# Patient Record
Sex: Male | Born: 1945 | Race: Black or African American | Hispanic: No | Smoking: Former smoker
Health system: Southern US, Community
[De-identification: ages and names within clinical notes are randomized; demographics above are authoritative.]

## PROBLEM LIST (undated history)

## (undated) DIAGNOSIS — K922 Gastrointestinal hemorrhage, unspecified: Secondary | ICD-10-CM

## (undated) DIAGNOSIS — E785 Hyperlipidemia, unspecified: Secondary | ICD-10-CM

## (undated) DIAGNOSIS — C163 Malignant neoplasm of pyloric antrum: Secondary | ICD-10-CM

## (undated) DIAGNOSIS — I4819 Other persistent atrial fibrillation: Principal | ICD-10-CM

## (undated) DIAGNOSIS — I10 Essential (primary) hypertension: Secondary | ICD-10-CM

## (undated) DIAGNOSIS — Z9289 Personal history of other medical treatment: Secondary | ICD-10-CM

## (undated) DIAGNOSIS — I639 Cerebral infarction, unspecified: Secondary | ICD-10-CM

## (undated) DIAGNOSIS — D649 Anemia, unspecified: Secondary | ICD-10-CM

## (undated) DIAGNOSIS — E119 Type 2 diabetes mellitus without complications: Secondary | ICD-10-CM

## (undated) HISTORY — DX: Other persistent atrial fibrillation: I48.19

## (undated) HISTORY — PX: INGUINAL HERNIA REPAIR: SUR1180

## (undated) HISTORY — DX: Hyperlipidemia, unspecified: E78.5

## (undated) HISTORY — DX: Essential (primary) hypertension: I10

## (undated) HISTORY — DX: Cerebral infarction, unspecified: I63.9

---

## 2006-12-29 ENCOUNTER — Inpatient Hospital Stay (HOSPITAL_COMMUNITY): Admission: EM | Admit: 2006-12-29 | Discharge: 2007-01-02 | Payer: Self-pay | Admitting: Emergency Medicine

## 2006-12-30 ENCOUNTER — Encounter (INDEPENDENT_AMBULATORY_CARE_PROVIDER_SITE_OTHER): Payer: Self-pay | Admitting: *Deleted

## 2006-12-31 ENCOUNTER — Ambulatory Visit: Payer: Self-pay | Admitting: Physical Medicine & Rehabilitation

## 2007-01-01 ENCOUNTER — Encounter (INDEPENDENT_AMBULATORY_CARE_PROVIDER_SITE_OTHER): Payer: Self-pay | Admitting: Cardiology

## 2007-03-07 DIAGNOSIS — I639 Cerebral infarction, unspecified: Secondary | ICD-10-CM

## 2007-03-07 HISTORY — DX: Cerebral infarction, unspecified: I63.9

## 2007-04-15 ENCOUNTER — Ambulatory Visit: Payer: Self-pay | Admitting: *Deleted

## 2007-05-28 ENCOUNTER — Encounter (INDEPENDENT_AMBULATORY_CARE_PROVIDER_SITE_OTHER): Payer: Self-pay | Admitting: Nurse Practitioner

## 2007-05-28 ENCOUNTER — Ambulatory Visit: Payer: Self-pay | Admitting: Internal Medicine

## 2007-05-28 LAB — CONVERTED CEMR LAB
AST: 21 units/L (ref 0–37)
BUN: 12 mg/dL (ref 6–23)
CO2: 24 meq/L (ref 19–32)
Creatinine, Ser: 0.99 mg/dL (ref 0.40–1.50)
Eosinophils Absolute: 0.1 10*3/uL (ref 0.0–0.7)
Glucose, Bld: 63 mg/dL — ABNORMAL LOW (ref 70–99)
Hemoglobin: 11.7 g/dL — ABNORMAL LOW (ref 13.0–17.0)
Lymphocytes Relative: 35 % (ref 12–46)
MCHC: 32.1 g/dL (ref 30.0–36.0)
Monocytes Absolute: 0.4 10*3/uL (ref 0.1–1.0)
Neutrophils Relative %: 58 % (ref 43–77)
Potassium: 4.3 meq/L (ref 3.5–5.3)
RBC: 4.42 M/uL (ref 4.22–5.81)
RDW: 14.8 % (ref 11.5–15.5)
TSH: 2.172 microintl units/mL (ref 0.350–5.50)
Total Bilirubin: 0.4 mg/dL (ref 0.3–1.2)
Total Protein: 7.2 g/dL (ref 6.0–8.3)
WBC: 6.1 10*3/uL (ref 4.0–10.5)

## 2007-06-26 ENCOUNTER — Encounter (INDEPENDENT_AMBULATORY_CARE_PROVIDER_SITE_OTHER): Payer: Self-pay | Admitting: Nurse Practitioner

## 2007-06-26 ENCOUNTER — Ambulatory Visit: Payer: Self-pay | Admitting: Family Medicine

## 2007-06-26 LAB — CONVERTED CEMR LAB
LDL Cholesterol: 67 mg/dL (ref 0–99)
Total CHOL/HDL Ratio: 3.2
Triglycerides: 74 mg/dL (ref <150)

## 2007-11-20 ENCOUNTER — Ambulatory Visit: Payer: Self-pay | Admitting: Internal Medicine

## 2007-11-20 LAB — CONVERTED CEMR LAB
ALT: 24 units/L (ref 0–53)
AST: 17 units/L (ref 0–37)
Albumin: 4.3 g/dL (ref 3.5–5.2)
Calcium: 9 mg/dL (ref 8.4–10.5)
Sodium: 144 meq/L (ref 135–145)
Total Bilirubin: 0.5 mg/dL (ref 0.3–1.2)

## 2007-12-02 ENCOUNTER — Ambulatory Visit: Payer: Self-pay | Admitting: Internal Medicine

## 2008-02-03 ENCOUNTER — Ambulatory Visit (HOSPITAL_COMMUNITY): Admission: RE | Admit: 2008-02-03 | Discharge: 2008-02-03 | Payer: Self-pay | Admitting: Internal Medicine

## 2008-02-03 ENCOUNTER — Ambulatory Visit: Payer: Self-pay | Admitting: Internal Medicine

## 2008-02-26 ENCOUNTER — Ambulatory Visit: Payer: Self-pay | Admitting: Internal Medicine

## 2008-05-03 ENCOUNTER — Emergency Department (HOSPITAL_COMMUNITY): Admission: EM | Admit: 2008-05-03 | Discharge: 2008-05-03 | Payer: Self-pay | Admitting: Emergency Medicine

## 2008-05-20 ENCOUNTER — Ambulatory Visit: Payer: Self-pay | Admitting: Internal Medicine

## 2008-05-22 ENCOUNTER — Ambulatory Visit: Payer: Self-pay | Admitting: Internal Medicine

## 2008-06-04 ENCOUNTER — Ambulatory Visit: Payer: Self-pay | Admitting: Internal Medicine

## 2008-06-23 ENCOUNTER — Encounter: Admission: RE | Admit: 2008-06-23 | Discharge: 2008-08-06 | Payer: Self-pay | Admitting: Internal Medicine

## 2008-08-19 ENCOUNTER — Ambulatory Visit: Payer: Self-pay | Admitting: Internal Medicine

## 2008-08-21 ENCOUNTER — Ambulatory Visit: Payer: Self-pay | Admitting: Family Medicine

## 2008-09-04 ENCOUNTER — Ambulatory Visit: Payer: Self-pay | Admitting: Family Medicine

## 2008-09-11 ENCOUNTER — Ambulatory Visit: Payer: Self-pay | Admitting: Internal Medicine

## 2008-10-06 ENCOUNTER — Ambulatory Visit: Payer: Self-pay | Admitting: Internal Medicine

## 2008-11-14 ENCOUNTER — Inpatient Hospital Stay (HOSPITAL_COMMUNITY): Admission: EM | Admit: 2008-11-14 | Discharge: 2008-11-16 | Payer: Self-pay | Admitting: Emergency Medicine

## 2008-11-16 ENCOUNTER — Encounter (INDEPENDENT_AMBULATORY_CARE_PROVIDER_SITE_OTHER): Payer: Self-pay | Admitting: Internal Medicine

## 2008-11-16 ENCOUNTER — Ambulatory Visit: Payer: Self-pay | Admitting: Vascular Surgery

## 2008-12-04 ENCOUNTER — Ambulatory Visit: Payer: Self-pay | Admitting: Internal Medicine

## 2008-12-04 ENCOUNTER — Encounter (INDEPENDENT_AMBULATORY_CARE_PROVIDER_SITE_OTHER): Payer: Self-pay | Admitting: Internal Medicine

## 2008-12-04 LAB — CONVERTED CEMR LAB
Basophils Absolute: 0 10*3/uL (ref 0.0–0.1)
Basophils Relative: 1 % (ref 0–1)
Eosinophils Absolute: 0 10*3/uL (ref 0.0–0.7)
Eosinophils Relative: 1 % (ref 0–5)
HCT: 35.9 % — ABNORMAL LOW (ref 39.0–52.0)
Lymphocytes Relative: 37 % (ref 12–46)
Lymphs Abs: 1.8 10*3/uL (ref 0.7–4.0)
MCV: 83.1 fL (ref 78.0–100.0)
Neutrophils Relative %: 54 % (ref 43–77)
PSA: 1 ng/mL (ref 0.10–4.00)
RBC: 4.32 M/uL (ref 4.22–5.81)
RDW: 14.6 % (ref 11.5–15.5)

## 2008-12-10 ENCOUNTER — Ambulatory Visit: Payer: Self-pay | Admitting: Internal Medicine

## 2008-12-29 ENCOUNTER — Ambulatory Visit: Payer: Self-pay | Admitting: Internal Medicine

## 2009-01-13 ENCOUNTER — Encounter: Payer: Self-pay | Admitting: Cardiology

## 2009-01-13 ENCOUNTER — Other Ambulatory Visit: Payer: Self-pay | Admitting: General Surgery

## 2009-01-14 ENCOUNTER — Encounter (INDEPENDENT_AMBULATORY_CARE_PROVIDER_SITE_OTHER): Payer: Self-pay | Admitting: Cardiology

## 2009-01-14 ENCOUNTER — Inpatient Hospital Stay (HOSPITAL_COMMUNITY): Admission: EM | Admit: 2009-01-14 | Discharge: 2009-01-22 | Payer: Self-pay | Admitting: Cardiology

## 2009-01-14 DIAGNOSIS — I4819 Other persistent atrial fibrillation: Secondary | ICD-10-CM

## 2009-01-14 HISTORY — DX: Other persistent atrial fibrillation: I48.19

## 2009-01-15 HISTORY — PX: CARDIAC CATHETERIZATION: SHX172

## 2009-02-10 ENCOUNTER — Ambulatory Visit: Payer: Self-pay | Admitting: Internal Medicine

## 2009-02-10 ENCOUNTER — Encounter (INDEPENDENT_AMBULATORY_CARE_PROVIDER_SITE_OTHER): Payer: Self-pay | Admitting: Internal Medicine

## 2009-02-10 LAB — CONVERTED CEMR LAB
Eosinophils Relative: 2 % (ref 0–5)
Microalb, Ur: 6.23 mg/dL — ABNORMAL HIGH (ref 0.00–1.89)
Monocytes Absolute: 0.4 10*3/uL (ref 0.1–1.0)
Platelets: 283 10*3/uL (ref 150–400)
Vitamin B-12: 1206 pg/mL — ABNORMAL HIGH (ref 211–911)
WBC: 4.8 10*3/uL (ref 4.0–10.5)

## 2009-03-02 ENCOUNTER — Ambulatory Visit: Payer: Self-pay | Admitting: Internal Medicine

## 2009-03-02 LAB — CONVERTED CEMR LAB
Hemoglobin: 10.6 g/dL — ABNORMAL LOW (ref 13.0–17.0)
Lymphocytes Relative: 38 % (ref 12–46)
Lymphs Abs: 1.5 10*3/uL (ref 0.7–4.0)
MCHC: 32.7 g/dL (ref 30.0–36.0)
MCV: 81 fL (ref 78.0–100.0)
Neutro Abs: 2 10*3/uL (ref 1.7–7.7)
Neutrophils Relative %: 52 % (ref 43–77)
Platelets: 237 10*3/uL (ref 150–400)
RBC: 4 M/uL — ABNORMAL LOW (ref 4.22–5.81)
RDW: 14.3 % (ref 11.5–15.5)
Vitamin B-12: 363 pg/mL (ref 211–911)
WBC: 3.9 10*3/uL — ABNORMAL LOW (ref 4.0–10.5)

## 2009-04-29 ENCOUNTER — Ambulatory Visit: Payer: Self-pay | Admitting: Internal Medicine

## 2009-05-06 ENCOUNTER — Ambulatory Visit (HOSPITAL_COMMUNITY): Admission: RE | Admit: 2009-05-06 | Discharge: 2009-05-06 | Payer: Self-pay | Admitting: General Surgery

## 2009-05-13 ENCOUNTER — Ambulatory Visit: Payer: Self-pay | Admitting: Internal Medicine

## 2009-05-13 LAB — CONVERTED CEMR LAB
Basophils Absolute: 0.1 10*3/uL (ref 0.0–0.1)
Basophils Relative: 1 % (ref 0–1)
HCT: 35.9 % — ABNORMAL LOW (ref 39.0–52.0)
Hemoglobin: 11 g/dL — ABNORMAL LOW (ref 13.0–17.0)
Iron: 47 ug/dL (ref 42–165)
Lymphocytes Relative: 33 % (ref 12–46)
MCHC: 30.6 g/dL (ref 30.0–36.0)
Microalb, Ur: 3.3 mg/dL — ABNORMAL HIGH (ref 0.00–1.89)
Monocytes Absolute: 0.4 10*3/uL (ref 0.1–1.0)
Neutrophils Relative %: 56 % (ref 43–77)
Platelets: 355 10*3/uL (ref 150–400)
RBC: 4.34 M/uL (ref 4.22–5.81)
RDW: 15.5 % (ref 11.5–15.5)
Saturation Ratios: 19 % — ABNORMAL LOW (ref 20–55)
TIBC: 254 ug/dL (ref 215–435)
UIBC: 207 ug/dL
WBC: 5.3 10*3/uL (ref 4.0–10.5)

## 2009-07-09 ENCOUNTER — Ambulatory Visit: Payer: Self-pay | Admitting: Internal Medicine

## 2009-08-31 ENCOUNTER — Ambulatory Visit: Payer: Self-pay | Admitting: Internal Medicine

## 2009-08-31 LAB — CONVERTED CEMR LAB
CO2: 21 meq/L (ref 19–32)
Calcium: 9.1 mg/dL (ref 8.4–10.5)
Glucose, Bld: 61 mg/dL — ABNORMAL LOW (ref 70–99)
HDL: 41 mg/dL (ref >39)
LDL Cholesterol: 70 mg/dL (ref 0–99)
Total CHOL/HDL Ratio: 3.1

## 2009-09-30 ENCOUNTER — Ambulatory Visit: Payer: Self-pay | Admitting: Internal Medicine

## 2009-10-27 ENCOUNTER — Ambulatory Visit: Payer: Self-pay | Admitting: Internal Medicine

## 2009-12-30 ENCOUNTER — Ambulatory Visit: Payer: Self-pay | Admitting: Internal Medicine

## 2009-12-30 ENCOUNTER — Encounter (INDEPENDENT_AMBULATORY_CARE_PROVIDER_SITE_OTHER): Payer: Self-pay | Admitting: *Deleted

## 2009-12-30 LAB — CONVERTED CEMR LAB
ALT: <8 units/L (ref 0–53)
Albumin: 4.2 g/dL (ref 3.5–5.2)
Alkaline Phosphatase: 60 units/L (ref 39–117)
CO2: 26 meq/L (ref 19–32)
Chloride: 106 meq/L (ref 96–112)
HDL: 39 mg/dL — ABNORMAL LOW (ref >39)
LDL Cholesterol: 86 mg/dL (ref 0–99)
Total Bilirubin: 0.3 mg/dL (ref 0.3–1.2)
Total CHOL/HDL Ratio: 3.5
Total Protein: 6.7 g/dL (ref 6.0–8.3)

## 2010-05-29 LAB — BASIC METABOLIC PANEL
BUN: 11 mg/dL (ref 6–23)
CO2: 28 mEq/L (ref 19–32)
Creatinine, Ser: 1.22 mg/dL (ref 0.4–1.5)
GFR calc Af Amer: >60 mL/min (ref >60)
GFR calc non Af Amer: 60 mL/min — ABNORMAL LOW (ref >60)

## 2010-05-29 LAB — CBC
HCT: 35.3 % — ABNORMAL LOW (ref 39.0–52.0)
Platelets: 214 10*3/uL (ref 150–400)
RDW: 15.5 % (ref 11.5–15.5)

## 2010-05-29 LAB — GLUCOSE, CAPILLARY
Glucose-Capillary: 102 mg/dL — ABNORMAL HIGH (ref 70–99)
Glucose-Capillary: 112 mg/dL — ABNORMAL HIGH (ref 70–99)

## 2010-06-08 LAB — LIPID PANEL
HDL: 33 mg/dL — ABNORMAL LOW (ref >39)
LDL Cholesterol: 57 mg/dL (ref 0–99)
Triglycerides: 70 mg/dL (ref <150)
VLDL: 14 mg/dL (ref 0–40)

## 2010-06-08 LAB — GLUCOSE, CAPILLARY
Glucose-Capillary: 113 mg/dL — ABNORMAL HIGH (ref 70–99)
Glucose-Capillary: 128 mg/dL — ABNORMAL HIGH (ref 70–99)
Glucose-Capillary: 83 mg/dL (ref 70–99)
Glucose-Capillary: 85 mg/dL (ref 70–99)
Glucose-Capillary: 96 mg/dL (ref 70–99)
Glucose-Capillary: 96 mg/dL (ref 70–99)
Glucose-Capillary: 102 mg/dL — ABNORMAL HIGH (ref 70–99)
Glucose-Capillary: 104 mg/dL — ABNORMAL HIGH (ref 70–99)
Glucose-Capillary: 116 mg/dL — ABNORMAL HIGH (ref 70–99)
Glucose-Capillary: 118 mg/dL — ABNORMAL HIGH (ref 70–99)
Glucose-Capillary: 119 mg/dL — ABNORMAL HIGH (ref 70–99)
Glucose-Capillary: 128 mg/dL — ABNORMAL HIGH (ref 70–99)
Glucose-Capillary: 142 mg/dL — ABNORMAL HIGH (ref 70–99)
Glucose-Capillary: 162 mg/dL — ABNORMAL HIGH (ref 70–99)
Glucose-Capillary: 163 mg/dL — ABNORMAL HIGH (ref 70–99)
Glucose-Capillary: 188 mg/dL — ABNORMAL HIGH (ref 70–99)
Glucose-Capillary: 76 mg/dL (ref 70–99)
Glucose-Capillary: 81 mg/dL (ref 70–99)
Glucose-Capillary: 91 mg/dL (ref 70–99)
Glucose-Capillary: 99 mg/dL (ref 70–99)

## 2010-06-08 LAB — BASIC METABOLIC PANEL
BUN: 11 mg/dL (ref 6–23)
BUN: 11 mg/dL (ref 6–23)
BUN: 11 mg/dL (ref 6–23)
BUN: 9 mg/dL (ref 6–23)
CO2: 25 mEq/L (ref 19–32)
CO2: 27 mEq/L (ref 19–32)
CO2: 27 mEq/L (ref 19–32)
Calcium: 8.8 mg/dL (ref 8.4–10.5)
Calcium: 8.3 mg/dL — ABNORMAL LOW (ref 8.4–10.5)
Calcium: 8.5 mg/dL (ref 8.4–10.5)
Calcium: 8.6 mg/dL (ref 8.4–10.5)
Creatinine, Ser: 1.04 mg/dL (ref 0.4–1.5)
GFR calc Af Amer: >60 mL/min (ref >60)
GFR calc non Af Amer: >60 mL/min (ref >60)
GFR calc non Af Amer: >60 mL/min (ref >60)
Glucose, Bld: 72 mg/dL (ref 70–99)
Glucose, Bld: 93 mg/dL (ref 70–99)
Glucose, Bld: 155 mg/dL — ABNORMAL HIGH (ref 70–99)
Glucose, Bld: 164 mg/dL — ABNORMAL HIGH (ref 70–99)
Glucose, Bld: 88 mg/dL (ref 70–99)
Potassium: 3.5 mEq/L (ref 3.5–5.1)
Potassium: 3.8 mEq/L (ref 3.5–5.1)
Sodium: 135 mEq/L (ref 135–145)
Sodium: 135 mEq/L (ref 135–145)
Sodium: 138 mEq/L (ref 135–145)

## 2010-06-08 LAB — HEPARIN LEVEL (UNFRACTIONATED)
Heparin Unfractionated: 0.33 IU/mL (ref 0.30–0.70)
Heparin Unfractionated: 0.37 IU/mL (ref 0.30–0.70)
Heparin Unfractionated: 0.2 IU/mL — ABNORMAL LOW (ref 0.30–0.70)
Heparin Unfractionated: 0.37 IU/mL (ref 0.30–0.70)
Heparin Unfractionated: 0.41 IU/mL (ref 0.30–0.70)
Heparin Unfractionated: 0.45 IU/mL (ref 0.30–0.70)

## 2010-06-08 LAB — CBC
HCT: 27.1 % — ABNORMAL LOW (ref 39.0–52.0)
HCT: 28 % — ABNORMAL LOW (ref 39.0–52.0)
HCT: 29 % — ABNORMAL LOW (ref 39.0–52.0)
HCT: 32.3 % — ABNORMAL LOW (ref 39.0–52.0)
HCT: 35.5 % — ABNORMAL LOW (ref 39.0–52.0)
Hemoglobin: 9.1 g/dL — ABNORMAL LOW (ref 13.0–17.0)
Hemoglobin: 9.4 g/dL — ABNORMAL LOW (ref 13.0–17.0)
Hemoglobin: 9.8 g/dL — ABNORMAL LOW (ref 13.0–17.0)
Hemoglobin: 11.6 g/dL — ABNORMAL LOW (ref 13.0–17.0)
MCHC: 33.6 g/dL (ref 30.0–36.0)
MCHC: 33.7 g/dL (ref 30.0–36.0)
MCHC: 33.7 g/dL (ref 30.0–36.0)
MCHC: 33.9 g/dL (ref 30.0–36.0)
MCHC: 32.7 g/dL (ref 30.0–36.0)
MCV: 83.9 fL (ref 78.0–100.0)
MCV: 83.9 fL (ref 78.0–100.0)
MCV: 84.4 fL (ref 78.0–100.0)
MCV: 84.2 fL (ref 78.0–100.0)
Platelets: 200 10*3/uL (ref 150–400)
Platelets: 158 10*3/uL (ref 150–400)
Platelets: 158 10*3/uL (ref 150–400)
Platelets: 158 10*3/uL (ref 150–400)
Platelets: 160 10*3/uL (ref 150–400)
Platelets: 203 10*3/uL (ref 150–400)
RBC: 3.23 MIL/uL — ABNORMAL LOW (ref 4.22–5.81)
RBC: 3.58 MIL/uL — ABNORMAL LOW (ref 4.22–5.81)
RBC: 3.45 MIL/uL — ABNORMAL LOW (ref 4.22–5.81)
RBC: 3.58 MIL/uL — ABNORMAL LOW (ref 4.22–5.81)
RBC: 4.21 MIL/uL — ABNORMAL LOW (ref 4.22–5.81)
RDW: 14.2 % (ref 11.5–15.5)
RDW: 13.8 % (ref 11.5–15.5)
RDW: 13.9 % (ref 11.5–15.5)
RDW: 13.9 % (ref 11.5–15.5)
RDW: 14.1 % (ref 11.5–15.5)
RDW: 14.3 % (ref 11.5–15.5)
RDW: 14.3 % (ref 11.5–15.5)
WBC: 4.7 10*3/uL (ref 4.0–10.5)
WBC: 5.4 10*3/uL (ref 4.0–10.5)
WBC: 5.5 10*3/uL (ref 4.0–10.5)
WBC: 5 10*3/uL (ref 4.0–10.5)

## 2010-06-08 LAB — COMPREHENSIVE METABOLIC PANEL
ALT: 14 U/L (ref 0–53)
ALT: 13 U/L (ref 0–53)
AST: 13 U/L (ref 0–37)
AST: 17 U/L (ref 0–37)
Albumin: 3.1 g/dL — ABNORMAL LOW (ref 3.5–5.2)
Albumin: 3.6 g/dL (ref 3.5–5.2)
Alkaline Phosphatase: 59 U/L (ref 39–117)
Alkaline Phosphatase: 68 U/L (ref 39–117)
BUN: 14 mg/dL (ref 6–23)
CO2: 30 mEq/L (ref 19–32)
Calcium: 9.4 mg/dL (ref 8.4–10.5)
Chloride: 104 mEq/L (ref 96–112)
Chloride: 103 mEq/L (ref 96–112)
Creatinine, Ser: 1.03 mg/dL (ref 0.4–1.5)
GFR calc Af Amer: >60 mL/min (ref >60)
GFR calc Af Amer: >60 mL/min (ref >60)
GFR calc non Af Amer: >60 mL/min (ref >60)
Glucose, Bld: 182 mg/dL — ABNORMAL HIGH (ref 70–99)
Potassium: 3.8 mEq/L (ref 3.5–5.1)
Potassium: 4.3 mEq/L (ref 3.5–5.1)
Sodium: 137 mEq/L (ref 135–145)
Sodium: 141 mEq/L (ref 135–145)
Total Bilirubin: 0.6 mg/dL (ref 0.3–1.2)
Total Bilirubin: 0.4 mg/dL (ref 0.3–1.2)
Total Protein: 5.8 g/dL — ABNORMAL LOW (ref 6.0–8.3)
Total Protein: 6.6 g/dL (ref 6.0–8.3)

## 2010-06-08 LAB — RAPID URINE DRUG SCREEN, HOSP PERFORMED
Amphetamines: NOT DETECTED
Benzodiazepines: NOT DETECTED
Cocaine: NOT DETECTED
Opiates: NOT DETECTED
Tetrahydrocannabinol: NOT DETECTED

## 2010-06-08 LAB — URINALYSIS, ROUTINE W REFLEX MICROSCOPIC
Bilirubin Urine: NEGATIVE
Glucose, UA: 250 mg/dL — AB
Hgb urine dipstick: NEGATIVE
Ketones, ur: NEGATIVE mg/dL
Nitrite: NEGATIVE
Protein, ur: NEGATIVE mg/dL
Specific Gravity, Urine: 1.02 (ref 1.005–1.030)
Urobilinogen, UA: 1 mg/dL (ref 0.0–1.0)
pH: 5.5 (ref 5.0–8.0)

## 2010-06-08 LAB — PROTIME-INR
INR: 1.83 — ABNORMAL HIGH (ref 0.00–1.49)
INR: 1.93 — ABNORMAL HIGH (ref 0.00–1.49)
INR: 1.21 (ref 0.00–1.49)
INR: 0.97 (ref 0.00–1.49)
Prothrombin Time: 21 seconds — ABNORMAL HIGH (ref 11.6–15.2)
Prothrombin Time: 30.8 seconds — ABNORMAL HIGH (ref 11.6–15.2)
Prothrombin Time: 15.2 seconds (ref 11.6–15.2)
Prothrombin Time: 12.8 seconds (ref 11.6–15.2)

## 2010-06-08 LAB — HEMOGLOBIN A1C
Hgb A1c MFr Bld: 6.2 % — ABNORMAL HIGH (ref 4.6–6.1)
Mean Plasma Glucose: 131 mg/dL

## 2010-06-08 LAB — MAGNESIUM: Magnesium: 1.5 mg/dL (ref 1.5–2.5)

## 2010-06-08 LAB — CK TOTAL AND CKMB (NOT AT ARMC)
CK, MB: 2.9 ng/mL (ref 0.3–4.0)
CK, MB: 2.7 ng/mL (ref 0.3–4.0)
Relative Index: 2.2 (ref 0.0–2.5)
Relative Index: 2.3 (ref 0.0–2.5)
Total CK: 118 U/L (ref 7–232)

## 2010-06-08 LAB — APTT: aPTT: 29 seconds (ref 24–37)

## 2010-06-08 LAB — BRAIN NATRIURETIC PEPTIDE
Pro B Natriuretic peptide (BNP): 206 pg/mL — ABNORMAL HIGH (ref 0.0–100.0)
Pro B Natriuretic peptide (BNP): 500 pg/mL — ABNORMAL HIGH (ref 0.0–100.0)

## 2010-06-08 LAB — TROPONIN I: Troponin I: 0.03 ng/mL (ref 0.00–0.06)

## 2010-06-08 LAB — TSH: TSH: 1.599 u[IU]/mL (ref 0.350–4.500)

## 2010-06-10 LAB — CBC
MCHC: 33.1 g/dL (ref 30.0–36.0)
MCHC: 33.4 g/dL (ref 30.0–36.0)
MCHC: 33.5 g/dL (ref 30.0–36.0)
MCV: 83.8 fL (ref 78.0–100.0)
MCV: 84.3 fL (ref 78.0–100.0)
Platelets: 188 10*3/uL (ref 150–400)
Platelets: 189 10*3/uL (ref 150–400)
RBC: 3.77 MIL/uL — ABNORMAL LOW (ref 4.22–5.81)
RBC: 3.83 MIL/uL — ABNORMAL LOW (ref 4.22–5.81)
RDW: 14.7 % (ref 11.5–15.5)
RDW: 14.9 % (ref 11.5–15.5)
RDW: 15 % (ref 11.5–15.5)

## 2010-06-10 LAB — DIFFERENTIAL
Basophils Absolute: 0 10*3/uL (ref 0.0–0.1)
Basophils Relative: 0 % (ref 0–1)
Lymphocytes Relative: 41 % (ref 12–46)
Neutro Abs: 2.1 10*3/uL (ref 1.7–7.7)
Neutrophils Relative %: 51 % (ref 43–77)

## 2010-06-10 LAB — COMPREHENSIVE METABOLIC PANEL
ALT: 11 U/L (ref 0–53)
AST: 20 U/L (ref 0–37)
Calcium: 8.9 mg/dL (ref 8.4–10.5)
GFR calc Af Amer: >60 mL/min (ref >60)
Sodium: 138 mEq/L (ref 135–145)
Total Protein: 5.9 g/dL — ABNORMAL LOW (ref 6.0–8.3)

## 2010-06-10 LAB — GLUCOSE, CAPILLARY
Glucose-Capillary: 142 mg/dL — ABNORMAL HIGH (ref 70–99)
Glucose-Capillary: 146 mg/dL — ABNORMAL HIGH (ref 70–99)
Glucose-Capillary: 183 mg/dL — ABNORMAL HIGH (ref 70–99)
Glucose-Capillary: 220 mg/dL — ABNORMAL HIGH (ref 70–99)
Glucose-Capillary: 85 mg/dL (ref 70–99)

## 2010-06-10 LAB — POCT I-STAT, CHEM 8
Calcium, Ion: 1.15 mmol/L (ref 1.12–1.32)
Creatinine, Ser: 1 mg/dL (ref 0.4–1.5)
Hemoglobin: 11.6 g/dL — ABNORMAL LOW (ref 13.0–17.0)
Sodium: 139 mEq/L (ref 135–145)
TCO2: 21 mmol/L (ref 0–100)

## 2010-06-10 LAB — BASIC METABOLIC PANEL
BUN: 7 mg/dL (ref 6–23)
CO2: 28 mEq/L (ref 19–32)
CO2: 29 mEq/L (ref 19–32)
Chloride: 102 mEq/L (ref 96–112)
Chloride: 105 mEq/L (ref 96–112)
Creatinine, Ser: 0.85 mg/dL (ref 0.4–1.5)
Creatinine, Ser: 0.89 mg/dL (ref 0.4–1.5)
GFR calc Af Amer: >60 mL/min (ref >60)

## 2010-06-10 LAB — CK TOTAL AND CKMB (NOT AT ARMC)
CK, MB: 3.3 ng/mL (ref 0.3–4.0)
Relative Index: 2.2 (ref 0.0–2.5)

## 2010-06-10 LAB — URINALYSIS, ROUTINE W REFLEX MICROSCOPIC
Bilirubin Urine: NEGATIVE
Hgb urine dipstick: NEGATIVE
Nitrite: NEGATIVE

## 2010-06-10 LAB — MAGNESIUM
Magnesium: 2 mg/dL (ref 1.5–2.5)
Magnesium: 2.2 mg/dL (ref 1.5–2.5)

## 2010-06-10 LAB — LIPID PANEL
Cholesterol: 117 mg/dL (ref 0–200)
Total CHOL/HDL Ratio: 3 RATIO

## 2010-06-10 LAB — IRON AND TIBC: UIBC: 170 ug/dL

## 2010-06-10 LAB — HEMOGLOBIN A1C
Hgb A1c MFr Bld: 6.4 % — ABNORMAL HIGH (ref 4.6–6.1)
Mean Plasma Glucose: 137 mg/dL

## 2010-06-10 LAB — FERRITIN: Ferritin: 44 ng/mL (ref 22–322)

## 2010-06-21 LAB — DIFFERENTIAL
Basophils Absolute: 0 10*3/uL (ref 0.0–0.1)
Basophils Relative: 0 % (ref 0–1)
Eosinophils Absolute: 0 10*3/uL (ref 0.0–0.7)
Monocytes Absolute: 0.3 10*3/uL (ref 0.1–1.0)
Neutro Abs: 3.3 10*3/uL (ref 1.7–7.7)

## 2010-06-21 LAB — COMPREHENSIVE METABOLIC PANEL
ALT: 28 U/L (ref 0–53)
Albumin: 3.8 g/dL (ref 3.5–5.2)
Alkaline Phosphatase: 68 U/L (ref 39–117)
BUN: 21 mg/dL (ref 6–23)
Chloride: 105 mEq/L (ref 96–112)
Glucose, Bld: 139 mg/dL — ABNORMAL HIGH (ref 70–99)
Potassium: 4.9 mEq/L (ref 3.5–5.1)
Total Bilirubin: 0.6 mg/dL (ref 0.3–1.2)

## 2010-06-21 LAB — POCT CARDIAC MARKERS
CKMB, poc: 1.2 ng/mL (ref 1.0–8.0)
CKMB, poc: 2.3 ng/mL (ref 1.0–8.0)
Troponin i, poc: <0.05 ng/mL (ref 0.00–0.09)

## 2010-06-21 LAB — CBC
HCT: 38 % — ABNORMAL LOW (ref 39.0–52.0)
Hemoglobin: 12.9 g/dL — ABNORMAL LOW (ref 13.0–17.0)
Platelets: 238 10*3/uL (ref 150–400)
WBC: 4.8 10*3/uL (ref 4.0–10.5)

## 2010-07-19 NOTE — Discharge Summary (Signed)
Patrick [name], Patrick [name]             ACCOUNT NO.:  [id number]   MEDICAL RECORD NO.:  [id number]          PATIENT TYPE:  INP   LOCATION:  3021                         FACILITY:  [facility]   PHYSICIAN:  Pramod P. Pearlean Brownie, MD    DATE OF BIRTH:  [birth year]   DATE OF ADMISSION:  12/29/2006  DATE OF DISCHARGE:  01/02/2007                               DISCHARGE SUMMARY   DISCHARGE DIAGNOSES:  1. 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      Infarct thought to be embolic though no source found.  2. Diabetes.  3. Hypertension.  4. Cigarette smoker.   DISCHARGE MEDICATIONS:  1. Lisinopril 10 mg a day.  2. Lipitor 10 mg a day.  3. Metformin 1000 mg b.i.d.  4. Glipizide XL 5 mg twice a day.  5. Aggrenox one p.o. q.h.s. x2 weeks and then increase to b.i.d.  6. Aspirin 81 mg 1 p.o. q.a.m. x2 weeks and then discontinue.  7. Tylenol 650 mg 1 hour before Aggrenox dose x1 week only and then      discontinue.   STUDIES PERFORMED:  1. CT of the brain on admission shows dense right MCA sign compatible      with MCA thrombus, distal.  2. MRI of the brain shows right middle cerebral artery branch vessel      infarct acute. There is involvement in the insulin temporal      parietal region.  3. MRA of the head shows right MCA branch vessel occlusion with      diminished flow seen in the right temporal parietal region compared      to the left.  4. CT of the head 24 hours post t-PA shows acute right MCA infarct      changes.  No hemorrhage.  5. Carotid Doppler shows no ICA stenosis.  6. Transcranial Doppler performed, results are pending.  7. 2-D echocardiogram shows EF of 60%, no source of embolus.  8. Transesophageal echocardiogram performed by Dr. Yates Decamp shows      aortic atherosclerosis.  No PFO or ASD.  9. EKG shows normal sinus rhythm.   LABORATORY STUDIES:  Cholesterol 117, triglycerides 82, HDL 33, LDL 68.  Urinalysis was 0-2 white blood cell, 0-2 red blood cells and 30  protein.  Homocystine 8.9.  Alcohol level less than 5.  Hemoglobin A1c 10.2.  Coags on admission normal. CBC with hemoglobin 12.2, hematocrit 36.7,  otherwise normal. Chemistry with glucose 125 otherwise normal, liver  function tests normal.   HISTORY OF PRESENT ILLNESS:  Patrick [name] is a 65 year old, right-  handed, African-American male with a history of hypertension and  diabetes. He presented to the emergency room after last being normal and  7:15 p.m. the evening of admission. The son noticed he had some left  facial droop, slurred speech and drowsiness.  He also seemed slightly  confused, weak on his left side.  He complained of a slight posterior  headache.  He denied any dizziness or loss of consciousness.  He was  brought to the emergency room where a Code Stroke was called  en route.  CT of the head showed a dense right MCA clot very distal.  He was felt  to be a t-PA candidate and full-dose t-PA was administered.  He was  admitted to the neuro ICU for follow-up.   HOSPITAL COURSE:  MRI was positive for a right MCA infarct.  He received  full-dose t-PA and CT 24 hours following showed no hemorrhage.  He had  some mild left hemiparesis. He was evaluated by PT and OT and felt he  could benefit from home health follow-up. Of note his vascular risk  factors of diabetes is poorly controlled with hemoglobin A1c of 10.1.  Blood pressure is well-controlled at in 120s/60.  The patient states he  lives with his son.  He has been interested in getting disability for  the past year but they have denied him.  He was applying for disability  based on his diabetes and hypertension.  He is now hopeful that his new  stroke will give him future disability. I am uncertain that that is  indeed case as he has been functional.  He has been asked to follow up  with Dr. Pearlean Brownie for a transcranial Doppler and bubble study after  discharge as it is felt his stroke is embolic though no source was   found.   CONDITION ON DISCHARGE:  The patient alert and oriented x3.  No aphasia.  No dysarthria.  His eye movements are full, his face is symmetric.  He  has no drift in his arms or legs.  He has no focal weakness.  His gait  is broad-based. His heart rate is regular.  His breath sounds are clear.   DISCHARGE/PLAN:  1. Discharge home with family.  2. Aggrenox for secondary stroke prevention.  3. Home health physical therapy.  4. Outpatient bubble study and emboli monitoring at Dr. Marlis Edelson office      within the next month.  5. Follow-up with primary care physician within 1 month [facility]      then followup with Dr. Pearlean Brownie in 2 months.      Annie Main, N.P.    ______________________________  Sunny Schlein. Pearlean Brownie, MD    SB/MEDQ  D:  01/02/2007  T:  01/02/2007  Job:  [id number]   cc:   Pramod P. Pearlean Brownie, MD  Dr. Barnetta Chapel

## 2010-07-19 NOTE — H&P (Signed)
Patrick [name], Patrick [name]             ACCOUNT NO.:  [id number]   MEDICAL RECORD NO.:  [id number]          PATIENT TYPE:  INP   LOCATION:  1826                         FACILITY:  [facility]   PHYSICIAN:  Bevelyn Buckles. Champey, M.D.DATE OF BIRTH:  08/28/1945   DATE OF ADMISSION:  12/29/2006  DATE OF DISCHARGE:                              HISTORY & PHYSICAL   REASON FOR ADMISSION:  Code stroke.   HISTORY OF PRESENT ILLNESS:  Patrick [name] is a 65 year old African-  American male with multiple medical problems who presents to the  emergency room after last being normal at 7:15 p.m. this evening.  The  son noticed that he had some left facial droop, slurred speech, and  drowsiness.  He also seemed slightly confused, slightly weak on his left  side.  He also complained of a slight posterior headache.  Denied any  dizziness or loss of consciousness.  The patient was brought to the  emergency room, and a code stroke was called en route by EMS.   PAST MEDICAL HISTORY:  Positive for hypertension and diabetes.   CURRENT MEDICATIONS:  Include lisinopril, Lipitor, metformin, glipizide,  and baby aspirin.   ALLERGIES:  NO KNOWN DRUG ALLERGIES.   FAMILY HISTORY:  Noncontributory.   SOCIAL HISTORY:  The patient lives with his son, smokes 1 pack of  cigarettes per day, denies any drug use, drinks 16 ounces of beer per  day.   REVIEW OF SYSTEMS:  Positive as per HPI.  Review of systems negative as  per HPI in greater than 7 other organ systems.   EXAMINATION:  VITALS:  Temperature is 98.4, blood pressure is 160/83,  pulse is 57, respiration is 18, and O2 saturation is 98%.  HEENT:  Normocephalic, atraumatic.  Extraocular muscles are intact.  Left visual field cut is given.  NECK:  Supple.  No carotid bruits.  HEART:  Regular.  LUNGS:  Clear.  ABDOMEN:  Soft.  EXTREMITIES:  Good pulses.  NEUROLOGICAL EXAMINATION:  The patient is drowsy yet awake, followed  commands appropriately, has dysarthric  speech.  Cranial nerves:  The  patient has a left facial droop.  Extraocular muscles intact.  The  patient has a left field cut present.  Motor examination:  The patient  has very subtle and mild weakness left upper and lower extremity of 4+  with 5/5 strength with a slight drift noted.  Sensory examination:  Patient does have positive extinction in the cleft noted on the left.  Cerebellar function is within normal with finger-to-nose.  Gait was not  assessed secondary to safety.  NIH stroke scale was 7.   LABS:  WBC is 5.3, hemoglobin 12.2, hematocrit is 36.7, platelets 221.  PT is 12.8, INR is 0.9, PTT is 28.  CT of the head showed a positive  hyperintense on the right MCA distal distribution consistent with  thrombus.   IMPRESSION:  This is a 65 year old with acute right middle cerebral  artery infarct.  The patient met criteria for intravenous tissue  plasminogen activator, and I have discussed the risks with the family  and the patient, and we  have agreed to proceed, give the patient  intravenous tissue plasminogen activator which was started at 9:49 p.m.  Will admit to stroke MD service to a complete stroke workup and then  with the intensive care unit for at least 24 hours.  Get a magnetic  resonance imaging/magnetic resonance angiography of the brain, carotid  Dopplers, 2-D echocardiogram, check fasting lipids and homocysteine  level.  Get physical therapy/occupational therapy, speech consults.  Continue the patient's home medications.  Get a computed tomography scan  24 hours post tissue plasminogen activator and start on an antiplatelet  such as Plavix 24 hours after tissue plasminogen activator.  We will  follow the patient while he is in the hospital.      Bevelyn Buckles. Nash Shearer, M.D.  Electronically Signed     DRC/MEDQ  D:  12/29/2006  T:  12/30/2006  Job:  [id number]

## 2010-09-28 ENCOUNTER — Encounter: Payer: Self-pay | Admitting: Podiatry

## 2010-09-28 DIAGNOSIS — I639 Cerebral infarction, unspecified: Secondary | ICD-10-CM

## 2010-09-28 DIAGNOSIS — I10 Essential (primary) hypertension: Secondary | ICD-10-CM | POA: Insufficient documentation

## 2010-09-28 DIAGNOSIS — E1165 Type 2 diabetes mellitus with hyperglycemia: Secondary | ICD-10-CM | POA: Insufficient documentation

## 2010-11-22 IMAGING — CR DG CHEST 2V
2 series · 2 of 2 positions shown · non-contrast
Comparison: None

CLINICAL DATA: Chest pain and nausea

CHEST - 2 VIEW

[w chest pa]
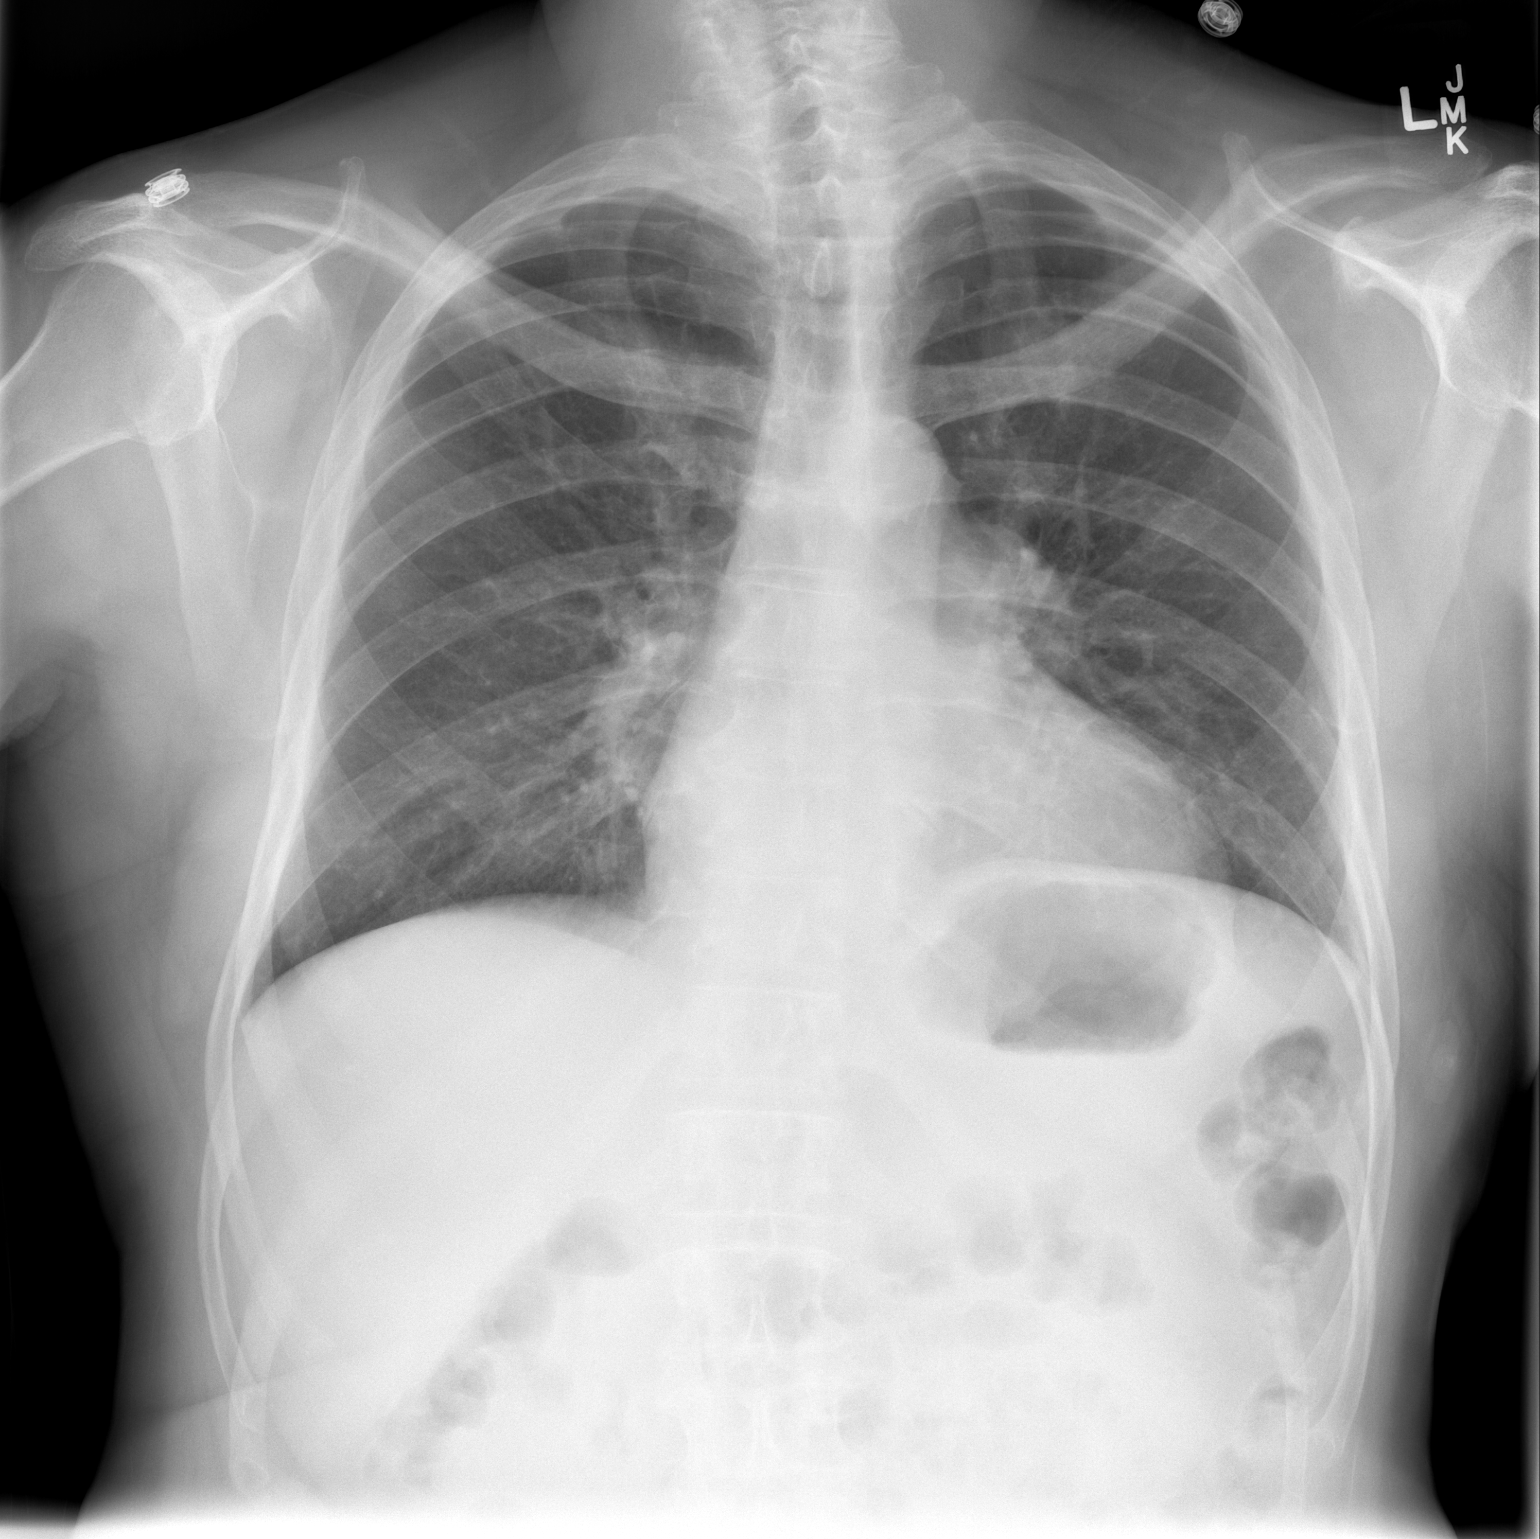

[w chest lat]
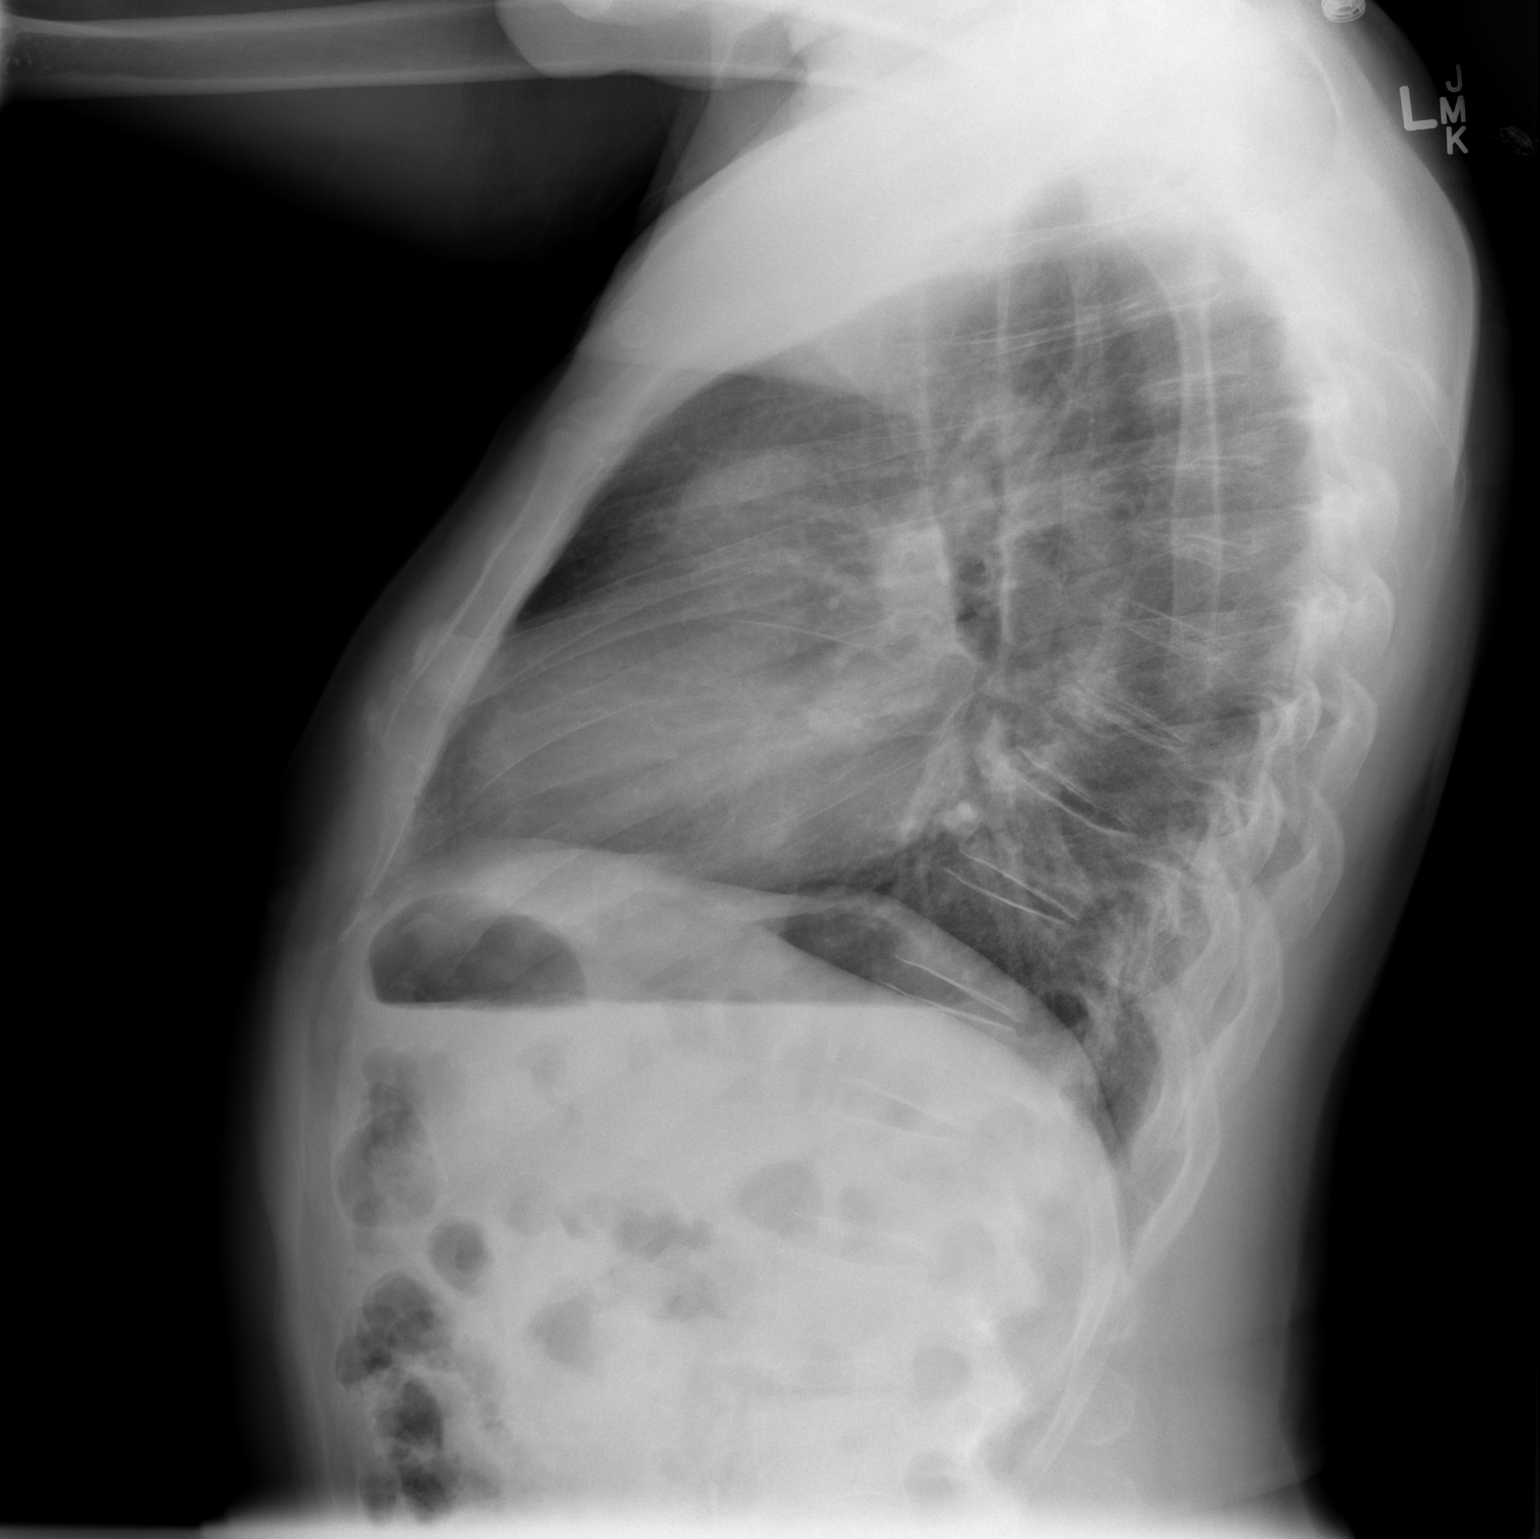

[2 of 2 positions shown; findings below may reference images not displayed]

FINDINGS: Heart size is upper normal.  Mediastinal contours within
normal limits.  Trachea is midline.  Lung volumes are low normal.
No airspace disease, vascular congestion, edema, or pneumothorax is
identified.  Upper abdomen demonstrates a normal bowel gas pattern.
No acute osseous abnormality.
IMPRESSION: No evidence of acute cardiopulmonary disease.

## 2010-12-05 HISTORY — PX: DOPPLER ECHOCARDIOGRAPHY: SHX263

## 2010-12-14 LAB — URINALYSIS, ROUTINE W REFLEX MICROSCOPIC
Hgb urine dipstick: NEGATIVE
Ketones, ur: 15 — AB
Protein, ur: 30 — AB
Urobilinogen, UA: 1

## 2010-12-14 LAB — COMPREHENSIVE METABOLIC PANEL
ALT: 43
CO2: 28
Calcium: 9.6
Chloride: 106
Creatinine, Ser: 0.86
GFR calc non Af Amer: >60
Glucose, Bld: 125 — ABNORMAL HIGH
Sodium: 140
Total Bilirubin: 0.4

## 2010-12-14 LAB — DIFFERENTIAL
Basophils Absolute: 0
Basophils Relative: 1
Eosinophils Absolute: 0
Eosinophils Relative: 1
Lymphs Abs: 2.1
Neutrophils Relative %: 53

## 2010-12-14 LAB — PROTIME-INR
INR: 0.9
Prothrombin Time: 12.8

## 2010-12-14 LAB — LIPID PANEL
Cholesterol: 117
HDL: 33 — ABNORMAL LOW

## 2010-12-14 LAB — CBC
HCT: 36.7 — ABNORMAL LOW
MCHC: 33.2
MCV: 83.1
Platelets: 221

## 2010-12-14 LAB — HOMOCYSTEINE: Homocysteine: 8.9

## 2010-12-14 LAB — URINE MICROSCOPIC-ADD ON

## 2010-12-14 LAB — ETHANOL: Alcohol, Ethyl (B): <5

## 2010-12-14 LAB — HEMOGLOBIN A1C
Hgb A1c MFr Bld: 10.2 — ABNORMAL HIGH
Mean Plasma Glucose: 286

## 2010-12-14 LAB — APTT: aPTT: 28

## 2010-12-14 LAB — CK TOTAL AND CKMB (NOT AT ARMC): Total CK: 130

## 2012-03-06 HISTORY — PX: CATARACT EXTRACTION W/ INTRAOCULAR LENS  IMPLANT, BILATERAL: SHX1307

## 2012-05-23 ENCOUNTER — Ambulatory Visit: Payer: Self-pay | Admitting: Cardiology

## 2012-05-23 DIAGNOSIS — Z7901 Long term (current) use of anticoagulants: Secondary | ICD-10-CM

## 2012-05-23 DIAGNOSIS — I482 Chronic atrial fibrillation, unspecified: Secondary | ICD-10-CM | POA: Insufficient documentation

## 2012-05-23 DIAGNOSIS — I4891 Unspecified atrial fibrillation: Secondary | ICD-10-CM

## 2012-05-23 DIAGNOSIS — I639 Cerebral infarction, unspecified: Secondary | ICD-10-CM

## 2012-08-14 ENCOUNTER — Ambulatory Visit (INDEPENDENT_AMBULATORY_CARE_PROVIDER_SITE_OTHER): Payer: Medicare Other | Admitting: Pharmacist Clinician (PhC)/ Clinical Pharmacy Specialist

## 2012-08-14 VITALS — BP 120/56 | HR 64

## 2012-08-14 DIAGNOSIS — Z7901 Long term (current) use of anticoagulants: Secondary | ICD-10-CM

## 2012-08-14 DIAGNOSIS — I6350 Cerebral infarction due to unspecified occlusion or stenosis of unspecified cerebral artery: Secondary | ICD-10-CM

## 2012-08-14 DIAGNOSIS — I4891 Unspecified atrial fibrillation: Secondary | ICD-10-CM

## 2012-08-14 DIAGNOSIS — I639 Cerebral infarction, unspecified: Secondary | ICD-10-CM

## 2012-08-14 LAB — POCT INR: INR: 2.6

## 2012-09-04 ENCOUNTER — Ambulatory Visit (INDEPENDENT_AMBULATORY_CARE_PROVIDER_SITE_OTHER): Payer: Medicare Other | Admitting: Pharmacist Clinician (PhC)/ Clinical Pharmacy Specialist

## 2012-09-04 VITALS — BP 114/68 | HR 76

## 2012-09-04 DIAGNOSIS — I6350 Cerebral infarction due to unspecified occlusion or stenosis of unspecified cerebral artery: Secondary | ICD-10-CM

## 2012-09-04 DIAGNOSIS — Z7901 Long term (current) use of anticoagulants: Secondary | ICD-10-CM

## 2012-09-04 DIAGNOSIS — I4891 Unspecified atrial fibrillation: Secondary | ICD-10-CM

## 2012-09-04 DIAGNOSIS — I639 Cerebral infarction, unspecified: Secondary | ICD-10-CM

## 2012-09-04 LAB — POCT INR: INR: 2.7

## 2012-10-16 ENCOUNTER — Ambulatory Visit (INDEPENDENT_AMBULATORY_CARE_PROVIDER_SITE_OTHER): Payer: Medicare Other | Admitting: Pharmacist Clinician (PhC)/ Clinical Pharmacy Specialist

## 2012-10-16 VITALS — BP 112/60 | HR 56

## 2012-10-16 DIAGNOSIS — I4891 Unspecified atrial fibrillation: Secondary | ICD-10-CM

## 2012-10-16 DIAGNOSIS — I6350 Cerebral infarction due to unspecified occlusion or stenosis of unspecified cerebral artery: Secondary | ICD-10-CM

## 2012-10-16 DIAGNOSIS — Z7901 Long term (current) use of anticoagulants: Secondary | ICD-10-CM

## 2012-10-16 DIAGNOSIS — I639 Cerebral infarction, unspecified: Secondary | ICD-10-CM

## 2012-10-16 LAB — POCT INR: INR: 2.6

## 2012-11-17 ENCOUNTER — Encounter: Payer: Self-pay | Admitting: *Deleted

## 2012-11-21 ENCOUNTER — Ambulatory Visit (INDEPENDENT_AMBULATORY_CARE_PROVIDER_SITE_OTHER): Payer: Medicare Other | Admitting: Cardiology

## 2012-11-21 ENCOUNTER — Encounter: Payer: Self-pay | Admitting: Cardiology

## 2012-11-21 VITALS — BP 100/74 | HR 67 | Ht 69.0 in | Wt 191.0 lb

## 2012-11-21 DIAGNOSIS — Z7901 Long term (current) use of anticoagulants: Secondary | ICD-10-CM

## 2012-11-21 DIAGNOSIS — I10 Essential (primary) hypertension: Secondary | ICD-10-CM

## 2012-11-21 DIAGNOSIS — I4891 Unspecified atrial fibrillation: Secondary | ICD-10-CM

## 2012-11-21 DIAGNOSIS — I6350 Cerebral infarction due to unspecified occlusion or stenosis of unspecified cerebral artery: Secondary | ICD-10-CM

## 2012-11-21 DIAGNOSIS — I639 Cerebral infarction, unspecified: Secondary | ICD-10-CM

## 2012-11-21 DIAGNOSIS — E785 Hyperlipidemia, unspecified: Secondary | ICD-10-CM

## 2012-11-21 NOTE — Assessment & Plan Note (Signed)
Warfarin levels being monitored in our clinic by Phillips Hay, RPH-CPT.  No active signs of bleeding.

## 2012-11-21 NOTE — Assessment & Plan Note (Signed)
This history of stroke in a patient with atrial fibrillation.  He has a CHADS2VASC2 score high enough to make sure that he is on an evaluation.  On warfarin currently.

## 2012-11-21 NOTE — Assessment & Plan Note (Signed)
The baby is a little hypotensive today with blood pressure of roughly 100.  He is on a good dose of carvedilol for rate control as well as diltiazem.  Myra condition he would get a little more symptomatic with some possible orthostatic symptoms I would decrease his lisinopril/HCTZ complement by one half.

## 2012-11-21 NOTE — Progress Notes (Signed)
PCP: Thayer Headings, MD  Clinic Note: Chief Complaint  Patient presents with  . Annual Exam    no chest pain, sometime with activity sob, no swelling    HPI: Patrick [name] is a 67 y.o. male with a PMH below who presents today for annual followup. I had the pleasure of seeing the patient here today in followup. As you recall, he is a very pleasant 67 y.o. -year-old gentleman who has a history of atrial fibrillation, on warfarin therapy. This was complicated with a stroke in 2008 with residual left-sided weakness, which is pretty much all but gone. He also has hypertension and dyslipidemia.  He actually recently had bilateral eye cataract surgery done in the spring of this year.  Interval History: He returns date feeling quite well.  Relatively stable.  He has minimal residual left-sided weakness.  When he walks long time in his use a cane.  Occasionally he'll note some dyspnea on exertion him if he really does push himself.  However, for the most part during his regular day-to-day activities, he denies any real significant symptoms of chest tightness or pressure with or without shortness of breath at rest or with exertion.  He denies any sensation of palpitations or noticing at all his atrial fibrillation.  He denies any rapid heart rates no lightheadedness, dizziness, wooziness, syncope or near syncope.  Next day or amaurosis fugax symptoms.  No melena, hematochezia or hematuria.  No nosebleeds.  No claudication symptoms.  He does have knee pain which is really alleviated by walking with a walking stick.  Past Medical History  Diagnosis Date  . Diabetes mellitus   . High blood pressure   . Stroke 2008    residual left sided weakness  . Dyslipidemia   . Paroxysmal atrial fibrillation 01/14/2009    afib--cardioversion successful x1 shock; recurrent A. fib  . Bilateral cataracts 2014     status post extraction   Prior Cardiac Evaluation and Past Surgical History: Past Surgical  History  Procedure Laterality Date  . Hernia repair    . Cardiac catheterization  01/15/2009    LV 40%, no occlusive coronary disease,norenal artery stenosis or abdmoninal aotric aneurysm  . Doppler echocardiography  10 /2012    EF normal -probably elevated left atrial pressures unable to asses due to afib.;mildly scelrotic aortic valve but  no stenosis and mildlyelevated right atrial  pressures and pulm. pressures at 45-50 mmhg with mild to mod pulm  htn no change from 2011  . Cataract extraction, bilateral  2014   No Known Allergies  Current Outpatient Prescriptions  Medication Sig Dispense Refill  . aspirin 81 MG EC tablet Take 81 mg by mouth daily.        Marland Kitchen atorvastatin (LIPITOR) 10 MG tablet Take 10 mg by mouth daily.        . carvedilol (COREG) 6.25 MG tablet Take 6.25 mg by mouth 2 (two) times daily with a meal.      . DILT-XR 180 MG 24 hr capsule Take 180 mg by mouth daily.       Marland Kitchen glipiZIDE (GLUCOTROL XL) 5 MG 24 hr tablet Take 5 mg by mouth 2 (two) times daily.      Marland Kitchen lisinopril-hydrochlorothiazide (PRINZIDE,ZESTORETIC) 20-12.5 MG per tablet Take 1 tablet by mouth daily.      Marland Kitchen METFORMIN HCL PO Take 1,000 mg by mouth 2 (two) times daily.        . pioglitazone (ACTOS) 15 MG tablet       .  warfarin (COUMADIN) 5 MG tablet Take 5 mg by mouth daily.       No current facility-administered medications for this visit.    History   Social History Narrative    He is a widower. He has 10 children, 2 grandchildren. Does not smoke, does not drink   alcohol. He walks about everywhere he goes and that is his main mode of transportation, and he uses a cane.   He quit smoking in ~2011.    ROS: A comprehensive Review of Systems - Negative except pertinent positives noted above as well as the mild muscular skeletal symptoms of his knee pains cough with a cane.  Minimal residual left sided weakness. he is watched his diet a lot more closely the last couple years.  His lost 10 pounds in the  last year on purpose.  PHYSICAL EXAM BP 100/74  Pulse 67  Ht 5\' 9"  (1.753 m)  Wt 191 lb (86.637 kg)  BMI 28.19 kg/m2 General appearance: alert, cooperative, appears stated age, no distress and Healthy-appearing, well-nourished and well-groomed.  It does questions appropriately.  Mild speech deficit, and mild gait instability, walking with cane. Neck: no adenopathy, no carotid bruit, no JVD and supple, symmetrical, trachea midline Lungs: clear to auscultation bilaterally, normal percussion bilaterally and Nonlabored, good air movement. Heart: irregularly irregular rhythm, S1, S2 normal, no S3 or S4 and Murmurs or rubs Abdomen: soft, non-tender; bowel sounds normal; no masses,  no organomegaly Extremities: extremities normal, atraumatic, no cyanosis or edema, no edema, redness or tenderness in the calves or thighs and no ulcers, gangrene or trophic changes Pulses: 2+ and symmetric Neurologic: Grossly normal  NWG:[id number] today: Yes Rate: 67, Rhythm: Atrial fibrillation;  otherwise normal ECG  Recent Labs: Check by Dr. Ronne Binning, not currently in system.  ASSESSMENT / PLAN: Atrial fibrillation Rate controlled with beta blocker.  Antiplatelet agent with warfarin.  No active symptoms.  His EF from 2010 catheterization with dramatically improved on echocardiographic evaluation.  No signs of heart failure or symptoms from the atrial fibrillation.  He is currently in atrial fibrillation today.  Plan: Continue current regimen.  No need to make any changes as he is asymptomatic and no signs of heart failure.  Stroke This history of stroke in a patient with atrial fibrillation.  He has a CHADS2VASC2 score high enough to make sure that he is on an evaluation.  On warfarin currently.  Dyslipidemia, goal LDL below 130 - on statin; followed by PCP His last lipid panel that I have recorded was him back in 2011.  Unknown for sure his have been checked more recently by Dr. Ronne Binning.  Unfortunate OM1  with me.  He is due however for recheck soon.  Last evaluation showed that the lipids were very well controlled.  Hypertension The baby is a little hypotensive today with blood pressure of roughly 100.  He is on a good dose of carvedilol for rate control as well as diltiazem.  Myra condition he would get a little more symptomatic with some possible orthostatic symptoms I would decrease his lisinopril/HCTZ complement by one half.  Long term (current) use of anticoagulants Warfarin levels being monitored in our clinic by Phillips Hay, RPH-CPT.  No active signs of bleeding.   Orders Placed This Encounter  Procedures  . EKG 12-Lead   Followup: One year  DAVID W. Herbie Baltimore, M.D., M.S. [facility] [address] [place], [place]  [address]  [phone] Pager # [phone]

## 2012-11-21 NOTE — Patient Instructions (Signed)
Your physician wants you to follow-up in 12 months Dr Herbie Baltimore.  You will receive a reminder letter in the mail two months in advance. If you don't receive a letter, please call our office to schedule the follow-up appointment.  continue with current medication

## 2012-11-21 NOTE — Assessment & Plan Note (Signed)
His last lipid panel that I have recorded was him back in 2011.  Unknown for sure his have been checked more recently by Dr. Ronne Binning.  Unfortunate OM1 with me.  He is due however for recheck soon.  Last evaluation showed that the lipids were very well controlled.

## 2012-11-21 NOTE — Assessment & Plan Note (Signed)
Rate controlled with beta blocker.  Antiplatelet agent with warfarin.  No active symptoms.  His EF from 2010 catheterization with dramatically improved on echocardiographic evaluation.  No signs of heart failure or symptoms from the atrial fibrillation.  He is currently in atrial fibrillation today.  Plan: Continue current regimen.  No need to make any changes as he is asymptomatic and no signs of heart failure.

## 2012-11-27 ENCOUNTER — Ambulatory Visit: Payer: Medicare Other | Admitting: Pharmacist Clinician (PhC)/ Clinical Pharmacy Specialist

## 2012-11-28 ENCOUNTER — Other Ambulatory Visit: Payer: Self-pay | Admitting: Pharmacist Clinician (PhC)/ Clinical Pharmacy Specialist

## 2012-11-28 ENCOUNTER — Ambulatory Visit (INDEPENDENT_AMBULATORY_CARE_PROVIDER_SITE_OTHER): Payer: Medicare Other | Admitting: Pharmacist Clinician (PhC)/ Clinical Pharmacy Specialist

## 2012-11-28 VITALS — BP 90/56 | HR 80

## 2012-11-28 DIAGNOSIS — I4891 Unspecified atrial fibrillation: Secondary | ICD-10-CM

## 2012-11-28 DIAGNOSIS — Z7901 Long term (current) use of anticoagulants: Secondary | ICD-10-CM

## 2012-11-28 DIAGNOSIS — I6350 Cerebral infarction due to unspecified occlusion or stenosis of unspecified cerebral artery: Secondary | ICD-10-CM

## 2012-11-28 DIAGNOSIS — I639 Cerebral infarction, unspecified: Secondary | ICD-10-CM

## 2012-11-28 LAB — POCT INR: INR: 3.8

## 2012-11-28 MED ORDER — WARFARIN SODIUM 5 MG PO TABS: ORAL_TABLET | ORAL | Status: DC

## 2012-11-29 ENCOUNTER — Telehealth: Payer: Self-pay | Admitting: Pharmacist Clinician (PhC)/ Clinical Pharmacy Specialist

## 2012-12-25 ENCOUNTER — Ambulatory Visit (INDEPENDENT_AMBULATORY_CARE_PROVIDER_SITE_OTHER): Payer: Medicare Other | Admitting: Pharmacist Clinician (PhC)/ Clinical Pharmacy Specialist

## 2012-12-25 VITALS — BP 112/64 | HR 72

## 2012-12-25 DIAGNOSIS — I639 Cerebral infarction, unspecified: Secondary | ICD-10-CM

## 2012-12-25 DIAGNOSIS — Z7901 Long term (current) use of anticoagulants: Secondary | ICD-10-CM

## 2012-12-25 DIAGNOSIS — I4891 Unspecified atrial fibrillation: Secondary | ICD-10-CM

## 2012-12-25 DIAGNOSIS — I6350 Cerebral infarction due to unspecified occlusion or stenosis of unspecified cerebral artery: Secondary | ICD-10-CM

## 2012-12-25 LAB — POCT INR: INR: 3.4

## 2013-01-14 ENCOUNTER — Other Ambulatory Visit: Payer: Self-pay | Admitting: Cardiology

## 2013-01-14 NOTE — Telephone Encounter (Signed)
Rx was sent to pharmacy electronically. 

## 2013-01-15 ENCOUNTER — Ambulatory Visit (INDEPENDENT_AMBULATORY_CARE_PROVIDER_SITE_OTHER): Payer: Medicare Other | Admitting: Pharmacist Clinician (PhC)/ Clinical Pharmacy Specialist

## 2013-01-15 VITALS — BP 130/56 | HR 68

## 2013-01-15 DIAGNOSIS — I639 Cerebral infarction, unspecified: Secondary | ICD-10-CM

## 2013-01-15 DIAGNOSIS — I4891 Unspecified atrial fibrillation: Secondary | ICD-10-CM

## 2013-01-15 DIAGNOSIS — Z7901 Long term (current) use of anticoagulants: Secondary | ICD-10-CM

## 2013-01-15 DIAGNOSIS — I6350 Cerebral infarction due to unspecified occlusion or stenosis of unspecified cerebral artery: Secondary | ICD-10-CM

## 2013-02-12 ENCOUNTER — Ambulatory Visit (INDEPENDENT_AMBULATORY_CARE_PROVIDER_SITE_OTHER): Payer: Medicare Other | Admitting: Pharmacist Clinician (PhC)/ Clinical Pharmacy Specialist

## 2013-02-12 VITALS — BP 122/72 | HR 76

## 2013-02-12 DIAGNOSIS — I639 Cerebral infarction, unspecified: Secondary | ICD-10-CM

## 2013-02-12 DIAGNOSIS — I4891 Unspecified atrial fibrillation: Secondary | ICD-10-CM

## 2013-02-12 DIAGNOSIS — Z7901 Long term (current) use of anticoagulants: Secondary | ICD-10-CM

## 2013-02-12 DIAGNOSIS — I6350 Cerebral infarction due to unspecified occlusion or stenosis of unspecified cerebral artery: Secondary | ICD-10-CM

## 2013-02-13 ENCOUNTER — Ambulatory Visit: Payer: Self-pay | Admitting: Podiatry

## 2013-03-03 ENCOUNTER — Encounter: Payer: Self-pay | Admitting: Podiatry

## 2013-03-03 ENCOUNTER — Ambulatory Visit (INDEPENDENT_AMBULATORY_CARE_PROVIDER_SITE_OTHER): Payer: Medicare Other | Admitting: Podiatry

## 2013-03-03 VITALS — BP 109/66 | HR 99 | Resp 16

## 2013-03-03 DIAGNOSIS — B351 Tinea unguium: Secondary | ICD-10-CM

## 2013-03-03 DIAGNOSIS — M79609 Pain in unspecified limb: Secondary | ICD-10-CM

## 2013-03-03 NOTE — Progress Notes (Signed)
Subjective:     Patient ID: Patrick [name], male   DOB: 17-Jan-1946, 67 y.o.   MRN: [id number]  HPI patient is found to have thick nail disease with pain 1-5 both feet   Review of Systems     Objective:   Physical Exam Nail disease with thickness of the bed 1-5 both feet    Assessment:     Mycotic nail infection with pain 1-5 both feet    Plan:     Debridement painful nailbeds 1-5 both feet with no bleeding noted

## 2013-03-03 NOTE — Patient Instructions (Signed)
Diabetes and Foot Care Diabetes may cause you to have problems because of poor blood supply (circulation) to your feet and legs. This may cause the skin on your feet to become thinner, break easier, and heal more slowly. Your skin may become dry, and the skin may peel and crack. You may also have nerve damage in your legs and feet causing decreased feeling in them. You may not notice minor injuries to your feet that could lead to infections or more serious problems. Taking care of your feet is one of the most important things you can do for yourself.  HOME CARE INSTRUCTIONS  Wear shoes at all times, even in the house. Do not go barefoot. Bare feet are easily injured.  Check your feet daily for blisters, cuts, and redness. If you cannot see the bottom of your feet, use a mirror or ask someone for help.  Wash your feet with warm water (do not use hot water) and mild soap. Then pat your feet and the areas between your toes until they are completely dry. Do not soak your feet as this can dry your skin.  Apply a moisturizing lotion or petroleum jelly (that does not contain alcohol and is unscented) to the skin on your feet and to dry, brittle toenails. Do not apply lotion between your toes.  Trim your toenails straight across. Do not dig under them or around the cuticle. File the edges of your nails with an emery board or nail file.  Do not cut corns or calluses or try to remove them with medicine.  Wear clean socks or stockings every day. Make sure they are not too tight. Do not wear knee-high stockings since they may decrease blood flow to your legs.  Wear shoes that fit properly and have enough cushioning. To break in new shoes, wear them for just a few hours a day. This prevents you from injuring your feet. Always look in your shoes before you put them on to be sure there are no objects inside.  Do not cross your legs. This may decrease the blood flow to your feet.  If you find a minor scrape,  cut, or break in the skin on your feet, keep it and the skin around it clean and dry. These areas may be cleansed with mild soap and water. Do not cleanse the area with peroxide, alcohol, or iodine.  When you remove an adhesive bandage, be sure not to damage the skin around it.  If you have a wound, look at it several times a day to make sure it is healing.  Do not use heating pads or hot water bottles. They may burn your skin. If you have lost feeling in your feet or legs, you may not know it is happening until it is too late.  Make sure your health care provider performs a complete foot exam at least annually or more often if you have foot problems. Report any cuts, sores, or bruises to your health care provider immediately. SEEK MEDICAL CARE IF:   You have an injury that is not healing.  You have cuts or breaks in the skin.  You have an ingrown nail.  You notice redness on your legs or feet.  You feel burning or tingling in your legs or feet.  You have pain or cramps in your legs and feet.  Your legs or feet are numb.  Your feet always feel cold. SEEK IMMEDIATE MEDICAL CARE IF:   There is increasing redness,   swelling, or pain in or around a wound.  There is a red line that goes up your leg.  Pus is coming from a wound.  You develop a fever or as directed by your health care provider.  You notice a bad smell coming from an ulcer or wound. Document Released: 02/18/2000 Document Revised: 10/23/2012 Document Reviewed: 07/30/2012 [employer] Patient Information 2014 [employer].  

## 2013-03-12 ENCOUNTER — Ambulatory Visit (INDEPENDENT_AMBULATORY_CARE_PROVIDER_SITE_OTHER): Payer: Medicare Other | Admitting: Pharmacist Clinician (PhC)/ Clinical Pharmacy Specialist

## 2013-03-12 VITALS — BP 122/68 | HR 80

## 2013-03-12 DIAGNOSIS — Z7901 Long term (current) use of anticoagulants: Secondary | ICD-10-CM

## 2013-03-12 DIAGNOSIS — I6350 Cerebral infarction due to unspecified occlusion or stenosis of unspecified cerebral artery: Secondary | ICD-10-CM

## 2013-03-12 DIAGNOSIS — I639 Cerebral infarction, unspecified: Secondary | ICD-10-CM

## 2013-03-12 DIAGNOSIS — I4891 Unspecified atrial fibrillation: Secondary | ICD-10-CM

## 2013-03-12 LAB — POCT INR: INR: 2.3

## 2013-03-20 ENCOUNTER — Telehealth: Payer: Self-pay | Admitting: Cardiology

## 2013-03-20 ENCOUNTER — Other Ambulatory Visit (HOSPITAL_COMMUNITY): Payer: Self-pay | Admitting: Internal Medicine

## 2013-03-20 ENCOUNTER — Other Ambulatory Visit: Payer: Self-pay | Admitting: Pharmacist Clinician (PhC)/ Clinical Pharmacy Specialist

## 2013-03-20 DIAGNOSIS — R0602 Shortness of breath: Secondary | ICD-10-CM

## 2013-03-20 MED ORDER — CARVEDILOL 6.25 MG PO TABS: 6.2500 mg | ORAL_TABLET | Freq: Two times a day (BID) | ORAL | Status: DC

## 2013-03-20 NOTE — Telephone Encounter (Signed)
Pt called, asked for refill of carvedilol 6.25mg , 90 day supply to [facility] on [address].    Returned call, rx sent

## 2013-03-21 ENCOUNTER — Encounter (HOSPITAL_COMMUNITY): Payer: Self-pay | Admitting: *Deleted

## 2013-04-03 ENCOUNTER — Ambulatory Visit (HOSPITAL_COMMUNITY)
Admission: RE | Admit: 2013-04-03 | Discharge: 2013-04-03 | Disposition: A | Discharge status: Home / Self Care | Payer: Medicare Other | Source: Ambulatory Visit | Attending: Cardiovascular Disease | Admitting: Cardiovascular Disease

## 2013-04-03 ENCOUNTER — Encounter (HOSPITAL_COMMUNITY): Payer: Self-pay | Admitting: *Deleted

## 2013-04-03 DIAGNOSIS — R0602 Shortness of breath: Secondary | ICD-10-CM

## 2013-04-04 ENCOUNTER — Other Ambulatory Visit (HOSPITAL_COMMUNITY): Payer: Self-pay | Admitting: Internal Medicine

## 2013-04-04 DIAGNOSIS — I4891 Unspecified atrial fibrillation: Secondary | ICD-10-CM

## 2013-04-09 ENCOUNTER — Ambulatory Visit (HOSPITAL_COMMUNITY)
Admission: RE | Admit: 2013-04-09 | Discharge: 2013-04-09 | Disposition: A | Discharge status: Home / Self Care | Payer: Medicare Other | Source: Ambulatory Visit | Attending: Cardiovascular Disease | Admitting: Cardiovascular Disease

## 2013-04-09 ENCOUNTER — Ambulatory Visit (INDEPENDENT_AMBULATORY_CARE_PROVIDER_SITE_OTHER): Payer: Medicare Other | Admitting: Pharmacist Clinician (PhC)/ Clinical Pharmacy Specialist

## 2013-04-09 DIAGNOSIS — E785 Hyperlipidemia, unspecified: Secondary | ICD-10-CM | POA: Insufficient documentation

## 2013-04-09 DIAGNOSIS — I6350 Cerebral infarction due to unspecified occlusion or stenosis of unspecified cerebral artery: Secondary | ICD-10-CM

## 2013-04-09 DIAGNOSIS — I4891 Unspecified atrial fibrillation: Secondary | ICD-10-CM | POA: Insufficient documentation

## 2013-04-09 DIAGNOSIS — E119 Type 2 diabetes mellitus without complications: Secondary | ICD-10-CM | POA: Insufficient documentation

## 2013-04-09 DIAGNOSIS — R0609 Other forms of dyspnea: Secondary | ICD-10-CM | POA: Insufficient documentation

## 2013-04-09 DIAGNOSIS — R5383 Other fatigue: Secondary | ICD-10-CM

## 2013-04-09 DIAGNOSIS — I2510 Atherosclerotic heart disease of native coronary artery without angina pectoris: Secondary | ICD-10-CM | POA: Insufficient documentation

## 2013-04-09 DIAGNOSIS — I639 Cerebral infarction, unspecified: Secondary | ICD-10-CM

## 2013-04-09 DIAGNOSIS — I10 Essential (primary) hypertension: Secondary | ICD-10-CM | POA: Insufficient documentation

## 2013-04-09 DIAGNOSIS — Z87891 Personal history of nicotine dependence: Secondary | ICD-10-CM | POA: Insufficient documentation

## 2013-04-09 DIAGNOSIS — Z7901 Long term (current) use of anticoagulants: Secondary | ICD-10-CM

## 2013-04-09 DIAGNOSIS — Z8249 Family history of ischemic heart disease and other diseases of the circulatory system: Secondary | ICD-10-CM | POA: Insufficient documentation

## 2013-04-09 DIAGNOSIS — R0989 Other specified symptoms and signs involving the circulatory and respiratory systems: Secondary | ICD-10-CM | POA: Insufficient documentation

## 2013-04-09 DIAGNOSIS — R5381 Other malaise: Secondary | ICD-10-CM | POA: Insufficient documentation

## 2013-04-09 DIAGNOSIS — Z8673 Personal history of transient ischemic attack (TIA), and cerebral infarction without residual deficits: Secondary | ICD-10-CM | POA: Insufficient documentation

## 2013-04-09 LAB — POCT INR: INR: 2.1

## 2013-04-09 MED ORDER — TECHNETIUM TC 99M SESTAMIBI GENERIC - CARDIOLITE
30.1000 | Freq: Once | INTRAVENOUS | Status: AC | PRN
  Administered 2013-04-09: 30.1 via INTRAVENOUS

## 2013-04-09 MED ORDER — REGADENOSON 0.4 MG/5ML IV SOLN
0.4000 mg | Freq: Once | INTRAVENOUS | Status: AC
  Administered 2013-04-09: 0.4 mg via INTRAVENOUS

## 2013-04-09 MED ORDER — AMINOPHYLLINE 25 MG/ML IV SOLN
75.0000 mg | Freq: Once | INTRAVENOUS | Status: AC
  Administered 2013-04-09: 75 mg via INTRAVENOUS

## 2013-04-09 MED ORDER — TECHNETIUM TC 99M SESTAMIBI GENERIC - CARDIOLITE
10.3000 | Freq: Once | INTRAVENOUS | Status: AC | PRN
  Administered 2013-04-09: 10 via INTRAVENOUS

## 2013-04-09 NOTE — Procedures (Addendum)
[place] [address] [place] [address] [phone]  Cardiology Nuclear Med Study  Patrick [name] is a 68 y.o. male     MRN : [id number]     DOB: 1946-01-11  Procedure Date: 04/09/2013  Nuclear Med Background Indication for Stress Test:  Evaluation for Ischemia History:  CAD;A-FIB Cardiac Risk Factors: CVA, Family History - CAD, History of Smoking, Hypertension, Lipids and NIDDM  Symptoms:  DOE and Fatigue   Nuclear Pre-Procedure Caffeine/Decaff Intake:  12:00am NPO After: 10am   IV Site: R Forearm  IV 0.9% NS with Angio Cath:  22g  Chest Size (in):  44"  IV Started by: Azucena Cecil, RN  Height: 5\' 9"  (1.753 m)  Cup Size: n/a  BMI:  Body mass index is 28.19 kg/(m^2). Weight:  191 lb (86.637 kg)   Tech Comments:  n/a    Nuclear Med Study 1 or 2 day study: 1 day  Stress Test Type:  [place]  Order Authorizing Provider:  Glenetta Hew, MD   Resting Radionuclide: Technetium 7m Sestamibi  Resting Radionuclide Dose: 10.3 mCi   Stress Radionuclide:  Technetium 76m Sestamibi  Stress Radionuclide Dose: 30.1 mCi           Stress Protocol Rest HR: 80 Stress HR: 78  Rest BP: 133/79 Stress BP: 145/65  Exercise Time (min): n/a METS: n/a   Predicted Max HR: 151 bpm % Max HR: 63.58 bpm Rate Pressure Product: 13920  Dose of Adenosine (mg):  n/a Dose of Lexiscan: 0.4 mg  Dose of Atropine (mg): n/a Dose of Dobutamine: n/a mcg/kg/min (at max HR)  Stress Test Technologist: Leane Para, CCT Nuclear Technologist: Imagene Riches, CNMT   Rest Procedure:  Myocardial perfusion imaging was performed at rest 45 minutes following the intravenous administration of Technetium 74m Sestamibi. Stress Procedure:  The patient received IV Lexiscan 0.4 mg over 15-seconds.  Technetium 45m Sestamibi injected at 30-seconds.  There were no significant changes with Lexiscan.  Quantitative spect images were obtained after a 45 minute  delay.  Transient Ischemic Dilatation (Normal <1.22):  1.12 Lung/Heart Ratio (Normal <0.45):  0.36 QGS EDV:  n/a ml QGS ESV:  n/a ml LV Ejection Fraction: Study not gated  Rest ECG: Atrial Fibrilliation  Stress ECG: There are scattered PVCs.  QPS Raw Data Images:  Normal; no motion artifact; normal heart/lung ratio. Stress Images:  Normal homogeneous uptake in all areas of the myocardium. Rest Images:  Normal homogeneous uptake in all areas of the myocardium. Subtraction (SDS):  No evidence of ischemia.  Impression Exercise Capacity:  Lexiscan with no exercise. BP Response:  Hypotensive blood pressure response. Clinical Symptoms:  No significant symptoms noted. ECG Impression:  There are scattered PVCs. Comparison with Prior Nuclear Study: No images to compare  Overall Impression:  Normal stress nuclear study.  LV Wall Motion:  Non-gated due to a-fib and PVC's.  Pixie Casino, MD, [facility] Board Certified in Nuclear Cardiology Attending Cardiologist [place], MD  04/09/2013 4:51 PM

## 2013-05-21 ENCOUNTER — Ambulatory Visit (INDEPENDENT_AMBULATORY_CARE_PROVIDER_SITE_OTHER): Payer: Medicare Other | Admitting: Pharmacist Clinician (PhC)/ Clinical Pharmacy Specialist

## 2013-05-21 VITALS — BP 110/58 | HR 72

## 2013-05-21 DIAGNOSIS — I639 Cerebral infarction, unspecified: Secondary | ICD-10-CM

## 2013-05-21 DIAGNOSIS — I4891 Unspecified atrial fibrillation: Secondary | ICD-10-CM

## 2013-05-21 DIAGNOSIS — I6350 Cerebral infarction due to unspecified occlusion or stenosis of unspecified cerebral artery: Secondary | ICD-10-CM

## 2013-05-21 DIAGNOSIS — Z7901 Long term (current) use of anticoagulants: Secondary | ICD-10-CM

## 2013-05-21 LAB — POCT INR: INR: 2.3

## 2013-05-26 ENCOUNTER — Ambulatory Visit (INDEPENDENT_AMBULATORY_CARE_PROVIDER_SITE_OTHER): Payer: Medicare Other | Admitting: Podiatry

## 2013-05-26 VITALS — BP 112/86 | HR 78 | Resp 12

## 2013-05-26 DIAGNOSIS — M79609 Pain in unspecified limb: Secondary | ICD-10-CM

## 2013-05-26 DIAGNOSIS — B351 Tinea unguium: Secondary | ICD-10-CM

## 2013-05-27 NOTE — Progress Notes (Signed)
Subjective:     Patient ID: Patrick [name], male   DOB: 1946-01-19, 68 y.o.   MRN: [id number]  HPI patient presents with thick nailbeds 1-5 both feet that are impossible for him to cut due to there discomfort in the way they grow   Review of Systems     Objective:   Physical Exam Neurovascular status unchanged well oriented x3 with thick painful nailbeds 1-5 both feet    Assessment:     Mycotic nail infection with pain 1-5 both feet    Plan:     Debridement painful nailbeds 1-5 both feet with no bleeding noted

## 2013-06-25 NOTE — Telephone Encounter (Signed)
Encounter closed---TP 06/25/13 

## 2013-06-26 NOTE — Telephone Encounter (Signed)
Closed encounter °

## 2013-07-02 ENCOUNTER — Ambulatory Visit (INDEPENDENT_AMBULATORY_CARE_PROVIDER_SITE_OTHER): Payer: Medicare Other | Admitting: Pharmacist Clinician (PhC)/ Clinical Pharmacy Specialist

## 2013-07-02 VITALS — BP 122/68 | HR 68

## 2013-07-02 DIAGNOSIS — I4891 Unspecified atrial fibrillation: Secondary | ICD-10-CM

## 2013-07-02 DIAGNOSIS — Z7901 Long term (current) use of anticoagulants: Secondary | ICD-10-CM

## 2013-07-02 DIAGNOSIS — I6350 Cerebral infarction due to unspecified occlusion or stenosis of unspecified cerebral artery: Secondary | ICD-10-CM

## 2013-07-02 DIAGNOSIS — I639 Cerebral infarction, unspecified: Secondary | ICD-10-CM

## 2013-07-02 LAB — POCT INR: INR: 2.2

## 2013-08-05 ENCOUNTER — Other Ambulatory Visit: Payer: Self-pay | Admitting: Pharmacist Clinician (PhC)/ Clinical Pharmacy Specialist

## 2013-08-05 MED ORDER — WARFARIN SODIUM 5 MG PO TABS: ORAL_TABLET | ORAL | Status: DC

## 2013-08-13 ENCOUNTER — Other Ambulatory Visit: Payer: Self-pay | Admitting: Cardiology

## 2013-08-13 ENCOUNTER — Ambulatory Visit (INDEPENDENT_AMBULATORY_CARE_PROVIDER_SITE_OTHER): Payer: Medicare Other | Admitting: Pharmacist Clinician (PhC)/ Clinical Pharmacy Specialist

## 2013-08-13 DIAGNOSIS — I4891 Unspecified atrial fibrillation: Secondary | ICD-10-CM

## 2013-08-13 DIAGNOSIS — Z7901 Long term (current) use of anticoagulants: Secondary | ICD-10-CM

## 2013-08-13 DIAGNOSIS — I639 Cerebral infarction, unspecified: Secondary | ICD-10-CM

## 2013-08-13 DIAGNOSIS — I6350 Cerebral infarction due to unspecified occlusion or stenosis of unspecified cerebral artery: Secondary | ICD-10-CM

## 2013-08-13 LAB — POCT INR: INR: 1.9

## 2013-08-13 NOTE — Telephone Encounter (Signed)
Rx was sent to pharmacy electronically. 

## 2013-08-25 ENCOUNTER — Ambulatory Visit (INDEPENDENT_AMBULATORY_CARE_PROVIDER_SITE_OTHER): Payer: Medicare Other | Admitting: Podiatry

## 2013-08-25 ENCOUNTER — Encounter: Payer: Self-pay | Admitting: Podiatry

## 2013-08-25 VITALS — BP 112/65 | HR 67 | Resp 12

## 2013-08-25 DIAGNOSIS — M79609 Pain in unspecified limb: Secondary | ICD-10-CM

## 2013-08-25 DIAGNOSIS — M79673 Pain in unspecified foot: Secondary | ICD-10-CM

## 2013-08-25 DIAGNOSIS — B351 Tinea unguium: Secondary | ICD-10-CM

## 2013-08-25 NOTE — Progress Notes (Signed)
Subjective:     Patient ID: Patrick [name], male   DOB: 10/16/1945, 68 y.o.   MRN: [id number]  HPI patient presents with thick painful yellow brittle nails 1-5 both feet   Review of Systems     Objective:   Physical Exam Neurovascular status intact with thick yellow nailbeds 1-5 both feet that are painful when pressed    Assessment:     Mycotic nail infection with pain 1-5 both feet    Plan:     Debris painful nailbeds 1-5 both feet with no iatrogenic bleeding noted

## 2013-09-24 ENCOUNTER — Ambulatory Visit (INDEPENDENT_AMBULATORY_CARE_PROVIDER_SITE_OTHER): Payer: Medicare Other | Admitting: Pharmacist Clinician (PhC)/ Clinical Pharmacy Specialist

## 2013-09-24 VITALS — BP 110/58 | HR 64

## 2013-09-24 DIAGNOSIS — I639 Cerebral infarction, unspecified: Secondary | ICD-10-CM

## 2013-09-24 DIAGNOSIS — Z7901 Long term (current) use of anticoagulants: Secondary | ICD-10-CM

## 2013-09-24 DIAGNOSIS — I4891 Unspecified atrial fibrillation: Secondary | ICD-10-CM

## 2013-09-24 DIAGNOSIS — I6350 Cerebral infarction due to unspecified occlusion or stenosis of unspecified cerebral artery: Secondary | ICD-10-CM

## 2013-09-24 LAB — POCT INR: INR: 2

## 2013-10-21 ENCOUNTER — Other Ambulatory Visit: Payer: Self-pay | Admitting: Gastroenterology

## 2013-10-21 DIAGNOSIS — D490 Neoplasm of unspecified behavior of digestive system: Secondary | ICD-10-CM

## 2013-10-21 DIAGNOSIS — R198 Other specified symptoms and signs involving the digestive system and abdomen: Secondary | ICD-10-CM

## 2013-10-21 HISTORY — PX: BIOPSY STOMACH: PRO33

## 2013-10-28 ENCOUNTER — Ambulatory Visit
Admission: RE | Admit: 2013-10-28 | Discharge: 2013-10-28 | Disposition: A | Discharge status: Home / Self Care | Payer: Medicare Other | Source: Ambulatory Visit | Attending: Gastroenterology | Admitting: Gastroenterology

## 2013-10-28 ENCOUNTER — Telehealth: Payer: Self-pay | Admitting: Oncology

## 2013-10-28 DIAGNOSIS — D490 Neoplasm of unspecified behavior of digestive system: Secondary | ICD-10-CM

## 2013-10-28 DIAGNOSIS — R198 Other specified symptoms and signs involving the digestive system and abdomen: Secondary | ICD-10-CM

## 2013-10-28 MED ORDER — IOHEXOL 300 MG/ML  SOLN
100.0000 mL | Freq: Once | INTRAMUSCULAR | Status: AC | PRN
  Administered 2013-10-28: 100 mL via INTRAVENOUS

## 2013-10-28 NOTE — Telephone Encounter (Signed)
LEFT MESSAGE FOR PATIENT AND GAVE NP APPT FOR 08/27 @ 1:30  W/DR. SHADAD.

## 2013-10-29 ENCOUNTER — Telehealth: Payer: Self-pay | Admitting: Oncology

## 2013-10-29 ENCOUNTER — Other Ambulatory Visit: Payer: Self-pay | Admitting: Oncology

## 2013-10-29 DIAGNOSIS — C169 Malignant neoplasm of stomach, unspecified: Secondary | ICD-10-CM

## 2013-10-29 NOTE — Telephone Encounter (Signed)
C/D 10/29/13 for appt. 10/30/13

## 2013-10-30 ENCOUNTER — Telehealth: Payer: Self-pay | Admitting: *Deleted

## 2013-10-30 ENCOUNTER — Telehealth: Payer: Self-pay | Admitting: Oncology

## 2013-10-30 ENCOUNTER — Encounter: Payer: Self-pay | Admitting: Oncology

## 2013-10-30 ENCOUNTER — Other Ambulatory Visit (HOSPITAL_BASED_OUTPATIENT_CLINIC_OR_DEPARTMENT_OTHER): Payer: Medicare Other

## 2013-10-30 ENCOUNTER — Ambulatory Visit: Payer: Medicare Other

## 2013-10-30 ENCOUNTER — Ambulatory Visit (HOSPITAL_BASED_OUTPATIENT_CLINIC_OR_DEPARTMENT_OTHER): Payer: Medicare Other | Admitting: Oncology

## 2013-10-30 VITALS — BP 114/63 | HR 86 | Temp 98.3°F | Resp 18 | Ht 69.0 in | Wt 202.8 lb

## 2013-10-30 DIAGNOSIS — C165 Malignant neoplasm of lesser curvature of stomach, unspecified: Secondary | ICD-10-CM

## 2013-10-30 DIAGNOSIS — D649 Anemia, unspecified: Secondary | ICD-10-CM

## 2013-10-30 DIAGNOSIS — C169 Malignant neoplasm of stomach, unspecified: Secondary | ICD-10-CM

## 2013-10-30 DIAGNOSIS — E119 Type 2 diabetes mellitus without complications: Secondary | ICD-10-CM

## 2013-10-30 DIAGNOSIS — D500 Iron deficiency anemia secondary to blood loss (chronic): Secondary | ICD-10-CM | POA: Insufficient documentation

## 2013-10-30 DIAGNOSIS — I4891 Unspecified atrial fibrillation: Secondary | ICD-10-CM

## 2013-10-30 LAB — CBC WITH DIFFERENTIAL/PLATELET
BASO%: 1.6 % (ref 0.0–2.0)
BASOS ABS: 0.1 10*3/uL (ref 0.0–0.1)
EOS%: 1.1 % (ref 0.0–7.0)
Eosinophils Absolute: 0.1 10*3/uL (ref 0.0–0.5)
HCT: 25.8 % — ABNORMAL LOW (ref 38.4–49.9)
HGB: 7.9 g/dL — ABNORMAL LOW (ref 13.0–17.1)
LYMPH#: 1 10*3/uL (ref 0.9–3.3)
LYMPH%: 22.5 % (ref 14.0–49.0)
MCH: 21.1 pg — ABNORMAL LOW (ref 27.2–33.4)
MCHC: 30.6 g/dL — ABNORMAL LOW (ref 32.0–36.0)
MCV: 68.8 fL — AB (ref 79.3–98.0)
MONO#: 0.4 10*3/uL (ref 0.1–0.9)
MONO%: 8.1 % (ref 0.0–14.0)
NEUT%: 66.7 % (ref 39.0–75.0)
NEUTROS ABS: 3 10*3/uL (ref 1.5–6.5)
Platelets: 300 10*3/uL (ref 140–400)
RBC: 3.75 10*6/uL — ABNORMAL LOW (ref 4.20–5.82)
RDW: 19.4 % — ABNORMAL HIGH (ref 11.0–14.6)
WBC: 4.5 10*3/uL (ref 4.0–10.3)

## 2013-10-30 LAB — COMPREHENSIVE METABOLIC PANEL (CC13)
ALBUMIN: 3.5 g/dL (ref 3.5–5.0)
ALT: 13 U/L (ref 0–55)
AST: 18 U/L (ref 5–34)
Alkaline Phosphatase: 97 U/L (ref 40–150)
Anion Gap: 8 mEq/L (ref 3–11)
BUN: 22.6 mg/dL (ref 7.0–26.0)
CALCIUM: 8.8 mg/dL (ref 8.4–10.4)
CHLORIDE: 109 meq/L (ref 98–109)
CO2: 23 meq/L (ref 22–29)
Creatinine: 1.2 mg/dL (ref 0.7–1.3)
GLUCOSE: 143 mg/dL — AB (ref 70–140)
POTASSIUM: 4.4 meq/L (ref 3.5–5.1)
Sodium: 140 mEq/L (ref 136–145)
Total Bilirubin: 0.51 mg/dL (ref 0.20–1.20)
Total Protein: 6.7 g/dL (ref 6.4–8.3)

## 2013-10-30 NOTE — Telephone Encounter (Signed)
Per staff message and POF I have scheduled appts. Advised scheduler of appts. JMW  

## 2013-10-30 NOTE — Progress Notes (Signed)
Please see consult note.  

## 2013-10-30 NOTE — Progress Notes (Signed)
Checked in new pt with no financial concerns. °

## 2013-10-30 NOTE — Consult Note (Signed)
Reason for Referral: Anemia and gastric adenocarcinoma.   HPI: 68 year old [place] American gentleman native of [place] but currently lives in [place] for the last 7-8 years. He is a gentleman with long-standing history of hypertension and diabetes as well as atrial fibrillation. He was found to develop iron deficiency anemia with a hemoglobin of 8.3 and an MCV of 70. He was referred by his primary care physician to Dr. Benson Norway for a GI workup. His evaluation revealed a level of 18 which is low with saturation of 5%. His ferritin was low at 10. He underwent a colonoscopy on 10/21/2013 which showed scattered large diverticula without any other abnormalities. He also underwent an endoscopy on the same day which showed a large fungating ulcerated non-circumferential mass that is bleeding and there lesser curvature of the stomach. Biopsies were taken and the pathology confirmed the presence of invasive adenocarcinoma, poorly differentiated. H. pylori organisms were identified. Testing for HER-2 was done and results are pending. CT scan of the abdomen and pelvis obtained on 10/28/2013 which showed no primary could be identified. Perigastric adenopathy but highly suspicious for localize nodal metastasis were described. Patient referred to me for further evaluation.  Clinically, he reports very little GI symptoms. He does report fatigue and tenderness. The does not report any hematochezia or melena. He does not report any abdominal pain or dyspepsia. He did have any history of any ulcers or NSAID intake. He does not report any headaches or blurred vision or double vision. He did have a mild stroke which did not really leave him with any deficits but does require a cane for ambulation. He does not report any headaches or seizures. Does not report any fevers, chills, sweats or weight loss. Does not report any chest pain, orthopnea, palpitations or PND. Does report any cough or wheezing or shortness of breath. He has  not reported any urinary symptoms of frequency urgency or hesitancy. He does not report any skeletal complaints of arthralgias or myalgias. He has not reported any lymphadenopathy or petechiae. He reports no skin rashes or lesions. Rest of her review of systems unremarkable.   Past Medical History  Diagnosis Date  . Diabetes mellitus   . High blood pressure   . Stroke 2008    residual left sided weakness  . Dyslipidemia   . Paroxysmal atrial fibrillation 01/14/2009    afib--cardioversion successful x1 shock; recurrent A. fib  . Bilateral cataracts 2014     status post extraction  :  Past Surgical History  Procedure Laterality Date  . Hernia repair    . Cardiac catheterization  01/15/2009    LV 40%, no occlusive coronary disease,norenal artery stenosis or abdmoninal aotric aneurysm  . Doppler echocardiography  10 /2012    EF normal -probably elevated left atrial pressures unable to asses due to afib.;mildly scelrotic aortic valve but  no stenosis and mildlyelevated right atrial  pressures and pulm. pressures at 45-50 mmhg with mild to mod pulm  htn no change from 2011  . Cataract extraction, bilateral  2014  :  Current Outpatient Prescriptions  Medication Sig Dispense Refill  . aspirin 81 MG EC tablet Take 81 mg by mouth daily.        Marland Kitchen atorvastatin (LIPITOR) 10 MG tablet Take 10 mg by mouth daily.        . carvedilol (COREG) 6.25 MG tablet Take 1 tablet (6.25 mg total) by mouth 2 (two) times daily with a meal.  180 tablet  2  . DILT-XR 180  MG 24 hr capsule take 1 capsule by mouth once daily  30 capsule  2  . glipiZIDE (GLUCOTROL XL) 5 MG 24 hr tablet Take 5 mg by mouth 2 (two) times daily.      Marland Kitchen lisinopril-hydrochlorothiazide (PRINZIDE,ZESTORETIC) 20-12.5 MG per tablet Take 1 tablet by mouth daily.      Marland Kitchen METFORMIN HCL PO Take 1,000 mg by mouth 2 (two) times daily.        . pioglitazone (ACTOS) 15 MG tablet       . PROAIR HFA 108 (90 BASE) MCG/ACT inhaler Inhale 2 puffs into the  lungs 4 (four) times daily as needed.      . warfarin (COUMADIN) 5 MG tablet Take 1 tablet by mouth daily or as directed  90 tablet  1   No current facility-administered medications for this visit.       No Known Allergies:  No family history on file.:  History   Social History  . Marital Status: Single    Spouse Name: N/A    Number of Children: N/A  . Years of Education: N/A   Occupational History  . Not on file.   Social History Main Topics  . Smoking status: Former Smoker    Types: Cigarettes    Quit date: 11/21/2008  . Smokeless tobacco: Not on file  . Alcohol Use: No  . Drug Use: Not on file  . Sexual Activity: Not on file   Other Topics Concern  . Not on file   Social History Narrative    He is a widower. He has 10 children, 2 grandchildren. Does not smoke, does not drink   alcohol. He walks about everywhere he goes and that is his main mode of transportation, and he uses a cane.   He quit smoking in ~2011.   :  Pertinent items are noted in HPI.  Exam: Blood pressure 114/63, pulse 86, temperature 98.3 F (36.8 C), temperature source Oral, resp. rate 18, height _0  (1.753 m), weight 202 lb 12.8 oz (91.989 kg). General appearance: alert and cooperative Head: Normocephalic, without obvious abnormality Throat: lips, mucosa, and tongue normal; teeth and gums normal Neck: no adenopathy Back: symmetric, no curvature. ROM normal. No CVA tenderness. Resp: clear to auscultation bilaterally Chest wall: no tenderness Cardio: regular rate and rhythm, S1, S2 normal, no murmur, click, rub or gallop Extremities: extremities normal, atraumatic, no cyanosis or edema Pulses: 2+ and symmetric Skin: Skin color, texture, turgor normal. No rashes or lesions Lymph nodes: Cervical, supraclavicular, and axillary nodes normal.   Recent Labs  10/30/13 1337  WBC 4.5  HGB 7.9*  HCT 25.8*  PLT 300     Ct Abdomen Pelvis W Contrast  10/28/2013   CLINICAL DATA:  New  diagnosis of gastric carcinoma along lesser curvature. Found on endoscopy 8/18. Diabetes.  EXAM: CT ABDOMEN AND PELVIS WITH CONTRAST  TECHNIQUE: Multidetector CT imaging of the abdomen and pelvis was performed using the standard protocol following bolus administration of intravenous contrast.  CONTRAST:  179m OMNIPAQUE IOHEXOL 300 MG/ML  SOLN  BUN and creatinine were obtained on [facility] at [facility]  [address]  Results:  BUN 24 mg/dL,  Creatinine 1.2 mg/dL.  COMPARISON:  None  FINDINGS: Lower Chest: Clear lung bases. Mild cardiomegaly with coronary artery atherosclerosis. Trace posterior pericardial fluid, likely physiologic. Small hiatal hernia.  Abdomen/Pelvis: Probable hepatic steatosis. Old granulomatous disease in the right lobe of the liver. Dilated hepatic veins and intrahepatic IVC ,  suggesting elevated right heart pressures.  Normal spleen. No dominant gastric lesion is seen within the fundus or body. The antrum is underdistended. Perigastric adenopathy. 1.6 x 1.5 cm perigastric node on image 22 of series 2.  A peripancreatic node (possibly within the gastrocolic ligament) measures 1.3 x 1.5 cm on image 29. Gastrohepatic ligament node of 1.8 x 1.4 cm on image 16.  Contracted gallbladder without pericholecystic inflammation. No biliary ductal dilatation.  Normal adrenal glands. Right-sided pelvic kidney. Normal left kidney.  Retroaortic left renal vein. Advanced aortic and branch vessel atherosclerosis. No retroperitoneal adenopathy.  Scattered colonic diverticula. Normal terminal ileum and appendix. Normal small bowel. Mild peritoneal thickening including along the left pericolic gutter. No evidence of omental or peritoneal disease.  Bilateral fat containing inguinal hernias. Larger on the left. No pelvic adenopathy. Normal urinary bladder and prostate.  Bones/Musculoskeletal: Osteopenia. Heterogeneous density throughout the sacrum. Mild loss of vertebral body height at L4-L5.   IMPRESSION: 1. The gastric primary is not definitely visualized. The gastric antrum is underdistended. 2. Perigastric adenopathy, highly suspicious for localize nodal metastasis. 3. Findings likely related to elevated right heart pressures and a component of fluid overload. 4. Right pelvic kidney. 5. Advanced atherosclerosis. 6. Heterogeneous density throughout the sacrum. Nonspecific. Question Paget's disease. Correlate with any history of radiation therapy, which could have a similar appearance. 7. Hepatic steatosis.   Electronically Signed   By: Abigail Miyamoto M.D.   On: 10/28/2013 12:12    Assessment and Plan:   68 year old gentleman with the following issues:  1. Invasive adenocarcinoma of the stomach. He presented with anemia and found to have a large fungating ulcer or in the lesser curvature of the stomach. This was biopsy proven to be malignant. His staging CT scan showed some perigastric lymphadenopathy suspicious for malignancy. The natural history of this disease was discussed with the patient as well as treatment options. I think appropriately staging his cancer is very critical in terms about treatment options. In order to do that he will require a PET/CT scan and possibly endoscopic ultrasound. If he has indeed had added disease outside of the stomach that he will probably need palliative systemic chemotherapy alone. He has organ confined disease, we can certainly consider radiation therapy and less likely surgical resection. Given his age, comorbid conditions it is very unlikely that he will be a surgical candidate but appropriate staging is warranted for prognostication as well.  The plan is to obtain a PET/CT scan and a quick followup after that.  2. Iron deficiency anemia: This is likely related to chronic blood loss from a bleeding ulcer. I discussed with them the role of IV iron replacement in an attempt to correct his iron deficiency anemia. Risks and benefits of this drug was  discussed and complications include arthralgias, myalgias and infusion related complications were discussed. Rarely anaphylaxis have been described associated Feraheme. He is agreeable to proceed in the near future.

## 2013-10-31 ENCOUNTER — Telehealth: Payer: Self-pay | Admitting: Oncology

## 2013-10-31 NOTE — Telephone Encounter (Signed)
, °

## 2013-11-04 ENCOUNTER — Ambulatory Visit (HOSPITAL_BASED_OUTPATIENT_CLINIC_OR_DEPARTMENT_OTHER): Payer: Medicare Other

## 2013-11-04 VITALS — BP 106/76 | HR 58 | Temp 98.2°F | Resp 18

## 2013-11-04 DIAGNOSIS — D500 Iron deficiency anemia secondary to blood loss (chronic): Secondary | ICD-10-CM

## 2013-11-04 MED ORDER — SODIUM CHLORIDE 0.9 % IV SOLN
Freq: Once | INTRAVENOUS | Status: AC
  Administered 2013-11-04: 09:00:00 via INTRAVENOUS

## 2013-11-04 MED ORDER — SODIUM CHLORIDE 0.9 % IV SOLN
1020.0000 mg | Freq: Once | INTRAVENOUS | Status: AC
  Administered 2013-11-04: 1020 mg via INTRAVENOUS
  Filled 2013-11-04: qty 34

## 2013-11-04 NOTE — Patient Instructions (Signed)

## 2013-11-05 ENCOUNTER — Ambulatory Visit (INDEPENDENT_AMBULATORY_CARE_PROVIDER_SITE_OTHER): Payer: Medicare Other | Admitting: Pharmacist Clinician (PhC)/ Clinical Pharmacy Specialist

## 2013-11-05 VITALS — BP 118/60 | HR 84

## 2013-11-05 DIAGNOSIS — I4891 Unspecified atrial fibrillation: Secondary | ICD-10-CM

## 2013-11-05 DIAGNOSIS — I6350 Cerebral infarction due to unspecified occlusion or stenosis of unspecified cerebral artery: Secondary | ICD-10-CM

## 2013-11-05 DIAGNOSIS — I639 Cerebral infarction, unspecified: Secondary | ICD-10-CM

## 2013-11-05 DIAGNOSIS — Z7901 Long term (current) use of anticoagulants: Secondary | ICD-10-CM

## 2013-11-05 LAB — POCT INR: INR: 2.1

## 2013-11-06 ENCOUNTER — Encounter (HOSPITAL_COMMUNITY): Payer: Self-pay

## 2013-11-06 ENCOUNTER — Ambulatory Visit (HOSPITAL_COMMUNITY)
Admission: RE | Admit: 2013-11-06 | Discharge: 2013-11-06 | Disposition: A | Discharge status: Home / Self Care | Payer: Medicare Other | Source: Ambulatory Visit | Attending: Oncology | Admitting: Oncology

## 2013-11-06 DIAGNOSIS — I2510 Atherosclerotic heart disease of native coronary artery without angina pectoris: Secondary | ICD-10-CM | POA: Diagnosis not present

## 2013-11-06 DIAGNOSIS — I517 Cardiomegaly: Secondary | ICD-10-CM | POA: Insufficient documentation

## 2013-11-06 DIAGNOSIS — C169 Malignant neoplasm of stomach, unspecified: Secondary | ICD-10-CM

## 2013-11-06 LAB — GLUCOSE, CAPILLARY: GLUCOSE-CAPILLARY: 77 mg/dL (ref 70–99)

## 2013-11-06 MED ORDER — FLUDEOXYGLUCOSE F - 18 (FDG) INJECTION: 10.2000 | Freq: Once | INTRAVENOUS | Status: AC | PRN

## 2013-11-11 ENCOUNTER — Telehealth: Payer: Self-pay | Admitting: Oncology

## 2013-11-11 ENCOUNTER — Ambulatory Visit (HOSPITAL_BASED_OUTPATIENT_CLINIC_OR_DEPARTMENT_OTHER): Payer: Medicare Other | Admitting: Oncology

## 2013-11-11 ENCOUNTER — Encounter: Payer: Self-pay | Admitting: Oncology

## 2013-11-11 ENCOUNTER — Other Ambulatory Visit: Payer: Self-pay | Admitting: Cardiology

## 2013-11-11 VITALS — BP 108/77 | HR 98 | Temp 97.6°F | Resp 19 | Ht 69.0 in | Wt 204.2 lb

## 2013-11-11 DIAGNOSIS — C169 Malignant neoplasm of stomach, unspecified: Secondary | ICD-10-CM

## 2013-11-11 DIAGNOSIS — C165 Malignant neoplasm of lesser curvature of stomach, unspecified: Secondary | ICD-10-CM

## 2013-11-11 DIAGNOSIS — Z23 Encounter for immunization: Secondary | ICD-10-CM

## 2013-11-11 DIAGNOSIS — D509 Iron deficiency anemia, unspecified: Secondary | ICD-10-CM

## 2013-11-11 MED ORDER — INFLUENZA VAC SPLIT QUAD 0.5 ML IM SUSY
0.5000 mL | PREFILLED_SYRINGE | Freq: Once | INTRAMUSCULAR | Status: AC
  Administered 2013-11-11: 0.5 mL via INTRAMUSCULAR
  Filled 2013-11-11: qty 0.5

## 2013-11-11 NOTE — Telephone Encounter (Signed)
Rx was sent to pharmacy electronically. 

## 2013-11-11 NOTE — Telephone Encounter (Signed)
gv pt appt schedule for nov and appt w/dr wentworth for 9/16.

## 2013-11-11 NOTE — Progress Notes (Signed)
Hematology and Oncology Follow Up Visit  Patrick [name] [id number] 08-04-1945 68 y.o. 11/11/2013 10:08 AM Thressa Sheller, MDMackenzie, [name], MD   Principle Diagnosis: 68 year old gentleman with invasive adenocarcinoma of the stomach. He presented with a large elsewhere and iron deficiency anemia related to that in August of 2015. He is status post an endoscopy and a biopsy on 10/21/2013 confirmed the diagnosis. He presented with a hemoglobin of 7.9, MCV 68.8  Prior Therapy: Status post IV iron 1 g given on 11/04/2013.  Current therapy: Under evaluation for further therapy.  Interim History: Patrick [name] presents today for a followup visit. Since his last visit, he underwent IV iron without any complications. He reports he felt better with increased energy level and less fatigued. He has not reported any further bleeding since the last visit. He does not report any hematochezia or melena. He does not report any hemoptysis or hematemesis. He is reported headaches or blurred vision or syncope. Does not report any fevers, chills, sweats or weight loss. Does not report any chest pain, orthopnea, palpitations or PND. Does report any cough or wheezing or shortness of breath. He has not reported any urinary symptoms of frequency urgency or hesitancy. He does not report any skeletal complaints of arthralgias or myalgias. He has not reported any lymphadenopathy or petechiae. He reports no skin rashes or lesions. Rest of her review of systems unremarkable.      Medications: I have reviewed the patient's current medications.  Current Outpatient Prescriptions  Medication Sig Dispense Refill  . aspirin 81 MG EC tablet Take 81 mg by mouth daily.        Marland Kitchen atorvastatin (LIPITOR) 10 MG tablet Take 10 mg by mouth daily.        . carvedilol (COREG) 6.25 MG tablet Take 1 tablet (6.25 mg total) by mouth 2 (two) times daily with a meal.  180 tablet  2  . DILT-XR 180 MG 24 hr capsule take 1 capsule by mouth once  daily  30 capsule  2  . glipiZIDE (GLUCOTROL XL) 5 MG 24 hr tablet Take 5 mg by mouth 2 (two) times daily.      Marland Kitchen lisinopril-hydrochlorothiazide (PRINZIDE,ZESTORETIC) 20-12.5 MG per tablet Take 1 tablet by mouth daily.      Marland Kitchen METFORMIN HCL PO Take 1,000 mg by mouth 2 (two) times daily.        . pioglitazone (ACTOS) 15 MG tablet       . PROAIR HFA 108 (90 BASE) MCG/ACT inhaler Inhale 2 puffs into the lungs 4 (four) times daily as needed.      . warfarin (COUMADIN) 5 MG tablet Take 1 tablet by mouth daily or as directed  90 tablet  1   No current facility-administered medications for this visit.     Allergies: No Known Allergies  Past Medical History, Surgical history, Social history, and Family History were reviewed and updated.   Physical Exam: Blood pressure 108/77, pulse 98, temperature 97.6 F (36.4 C), temperature source Oral, resp. rate 19, height 5\' 9"  (1.753 m), weight 204 lb 3 oz (92.619 kg). ECOG: 1 General appearance: alert and cooperative Head: Normocephalic, without obvious abnormality Neck: no adenopathy Lymph nodes: Cervical, supraclavicular, and axillary nodes normal. Heart: Irregular slightly tachycardic. Lung:chest clear, no wheezing, rales, normal symmetric air entry Abdomin: soft, non-tender, without masses or organomegaly EXT:no erythema, induration, or nodules   Lab Results: Lab Results  Component Value Date   WBC 4.5 10/30/2013   HGB 7.9* 10/30/2013   HCT 25.8*  10/30/2013   MCV 68.8* 10/30/2013   PLT 300 10/30/2013     Chemistry      Component Value Date/Time   NA 140 10/30/2013 1337   NA 144 12/30/2009 2034   K 4.4 10/30/2013 1337   K 4.8 12/30/2009 2034   CL 106 12/30/2009 2034   CO2 23 10/30/2013 1337   CO2 26 12/30/2009 2034   BUN 22.6 10/30/2013 1337   BUN 11 12/30/2009 2034   CREATININE 1.2 10/30/2013 1337   CREATININE 1.15 12/30/2009 2034      Component Value Date/Time   CALCIUM 8.8 10/30/2013 1337   CALCIUM 9.4 12/30/2009 2034   ALKPHOS 97  10/30/2013 1337   ALKPHOS 60 12/30/2009 2034   AST 18 10/30/2013 1337   AST 12 12/30/2009 2034   ALT 13 10/30/2013 1337   ALT <8 U/L 12/30/2009 2034   BILITOT 0.51 10/30/2013 1337   BILITOT 0.3 12/30/2009 2034       Radiological Studies:  EXAM:  NUCLEAR MEDICINE PET SKULL BASE TO THIGH  TECHNIQUE:  10.2 mCi F-18 FDG was injected intravenously. Full-ring PET imaging  was performed from the skull base to thigh after the radiotracer. CT  data was obtained and used for attenuation correction and anatomic  localization.  FASTING BLOOD GLUCOSE: Value: 77 mg/dl  COMPARISON: CT abdomen pelvis dated 10/28/2013  FINDINGS:  NECK  No hypermetabolic lymph nodes in the neck.  CHEST  No suspicious pulmonary nodules on the CT scan.  Prominent mediastinal nodes, including a 1.5 cm short axis  precarinal node (series 4/image 66) and a 1.9 cm short axis  subcarinal node (series 4/image 34), but without associated  hypermetabolism.  Cardiomegaly. Coronary atherosclerosis. Mild atherosclerotic  calcifications of the aortic arch.  ABDOMEN/PELVIS  Possible mild focal hypermetabolism involving the distal gastric  antrum, equivocal, max SUV 9.5. Possible associated wall thickening  in this region on CT (series 4/image 104). Correlate with location  of lesion on endoscopy.  Adjacent 13 mm short axis perigastric node (series 4/image 101),  although without significant hypermetabolism, max SUV 2.1.  Additional 1.3 cm node in the porta hepatis/adjacent to the duodenum  bulb (series 4/image 112), max SUV 5.5, worrisome for nodal  metastasis.  No abnormal hypermetabolic activity within the liver, pancreas,  adrenal glands, or spleen.  SKELETON  No focal hypermetabolic activity to suggest skeletal metastasis.  IMPRESSION:  Possible primary lesion in the distal gastric antrum, equivocal.  Correlate with location of lesion on endoscopy.  Two perigastric nodes, at least one of which adjacent to the   duodenum bulb demonstrates hypermetabolism, worrisome for nodal  metastasis.  Otherwise, no evidence of distal metastases.   Impression and Plan:  68 year old gentleman with the following issues:  1. Gastric adenocarcinoma presented with an ulcer in the lesser curvature of the stomach that is biopsy proven. PET/CT scan obtained on 11/06/2013 was reviewed and discussed with the patient. It showed a possible primary in the gastric antrum and to epigastric lymph node at least one of which adjacent to the duodenum there is worrisome for nodal metastasis. There is no distant metastasis that is noted on today's examination.  Options of treatment were discussed extensively with the patient. I doubt he is a candidate for any curative resection at this time given his comorbid conditions and limited performance status. I do not think he can handle multi-agent neoadjuvant chemotherapy followed by surgical resection. I think the goal would be to control his disease and palliate his intermittent GI bleeding.  Alternative option would be to radiate his tumor and reserve chemotherapy for palliation if he develops systemic disease. I do not see him as a great candidate for concomitant chemotherapy radiation at this point.  I will refer him to radiation oncology for an evaluation for a possible palliative radiation therapy to prevent recurrent bleeding from his ulcer is caused by his malignancy.  2. Iron deficiency anemia: He is status post IV iron and tolerated it well. I will repeat his iron studies in about 2 months with his clinical followup.  [facility], MD 9/8/201510:08 AM

## 2013-11-17 NOTE — Progress Notes (Addendum)
GI Location of Tumor / Histology:Invasive gastric  adenocarcinoma with ulcer in lesser curvature of the stomach.  Patrick [name] presented with iron deficiency anemia (hemoglobin 7.9) and recurrent bleeding ulcer biopsied on endoscopy and confirmed diagnosis of cancer on 10/21/13.  Biopsies of (if applicable) [id number]:3/74/82. Invasive adenocarcinoma, poorly differentiated intestinal type. helicobacter pylori organism identified. PET 11/06/13 revealed primary in gastric antrum and epigastric lymph node adjacent to duodenum worrisome for nodal metastasis.  Past/Anticipated interventions by surgeon, if LMB:EMLJ   Past/Anticipated interventions by medical oncology, if any: chemotherapy not reccommended secondary to co-morbidities.  Weight changes, if any: no stable  Bowel/Bladder complaints, if any: none  Nausea / Vomiting, if any: none  Pain issues, if any: none  SAFETY ISSUES:  Prior radiation? No  Pacemaker/ICD?No   Possible current pregnancy? N/A  Is the patient on methotrexate? No  Current Complaints / other details:Refrred for palliative radiation to prevent recurrent bleeding of ulcer.  NKDA NKDA

## 2013-11-19 ENCOUNTER — Ambulatory Visit
Admission: RE | Admit: 2013-11-19 | Discharge: 2013-11-19 | Disposition: A | Discharge status: Home / Self Care | Payer: Medicare Other | Source: Ambulatory Visit | Attending: Radiation Oncology | Admitting: Radiation Oncology

## 2013-11-19 ENCOUNTER — Encounter: Payer: Self-pay | Admitting: Radiation Oncology

## 2013-11-19 VITALS — BP 123/66 | HR 69 | Temp 97.5°F | Resp 16 | Ht 69.0 in | Wt 204.5 lb

## 2013-11-19 DIAGNOSIS — Z7982 Long term (current) use of aspirin: Secondary | ICD-10-CM | POA: Diagnosis not present

## 2013-11-19 DIAGNOSIS — I4891 Unspecified atrial fibrillation: Secondary | ICD-10-CM | POA: Diagnosis not present

## 2013-11-19 DIAGNOSIS — E785 Hyperlipidemia, unspecified: Secondary | ICD-10-CM | POA: Insufficient documentation

## 2013-11-19 DIAGNOSIS — Z87891 Personal history of nicotine dependence: Secondary | ICD-10-CM | POA: Diagnosis not present

## 2013-11-19 DIAGNOSIS — D649 Anemia, unspecified: Secondary | ICD-10-CM | POA: Diagnosis not present

## 2013-11-19 DIAGNOSIS — C163 Malignant neoplasm of pyloric antrum: Secondary | ICD-10-CM | POA: Insufficient documentation

## 2013-11-19 DIAGNOSIS — I10 Essential (primary) hypertension: Secondary | ICD-10-CM | POA: Diagnosis not present

## 2013-11-19 DIAGNOSIS — Z7901 Long term (current) use of anticoagulants: Secondary | ICD-10-CM | POA: Diagnosis not present

## 2013-11-19 DIAGNOSIS — Z510 Encounter for antineoplastic radiation therapy: Secondary | ICD-10-CM | POA: Diagnosis not present

## 2013-11-19 DIAGNOSIS — C169 Malignant neoplasm of stomach, unspecified: Secondary | ICD-10-CM | POA: Insufficient documentation

## 2013-11-19 DIAGNOSIS — E119 Type 2 diabetes mellitus without complications: Secondary | ICD-10-CM | POA: Diagnosis not present

## 2013-11-19 HISTORY — DX: Malignant neoplasm of pyloric antrum: C16.3

## 2013-11-19 NOTE — Progress Notes (Signed)
Please see the Nurse Progress Note in the MD Initial Consult Encounter for this patient. 

## 2013-11-19 NOTE — Progress Notes (Signed)
Radiation Oncology         [phone]) [phone] ________________________________  Initial outpatient Consultation - Date: 11/19/2013   Name: Patrick [name] MRN: [id number]   DOB: 08/10/1945  REFERRING PHYSICIAN: Wyatt Portela, MD  DIAGNOSIS:    ICD-9-CM ICD-10-CM  1. Stomach cancer 151.9 C16.9  2. 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 male who was diagnosed with anemia.  He had no change in stool color or blood in his stool. He had no nausea vomiting or weight loss. 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  A PET scan showed a lesion in the antrum with SUV of 9.5 and a few scattered suspicious perigastric nodes.  He met with Dr. Alen Blew who recommended palliative radiation. Due to his multiple co morbidities, he did not feel he was a candidate for combined modality treatment.  Patrick [name] is unaccompanied today. He performs his ADLs but needs to rest frequently. He walks with a cane due to arthritis. He lives with his son and uses public transportation. He is not interested in surgery.  PREVIOUS RADIATION THERAPY: No  PAST MEDICAL HISTORY:  has a past medical history of Diabetes mellitus; High blood pressure; Stroke (2008); Dyslipidemia; Paroxysmal atrial fibrillation (01/14/2009); Bilateral cataracts (2014); and Stomach cancer (10/21/13).    PAST SURGICAL HISTORY: Past Surgical History  Procedure Laterality Date  . Hernia repair    . Cardiac catheterization  01/15/2009    LV 40%, no occlusive coronary disease,norenal artery stenosis or abdmoninal aotric aneurysm  . Doppler echocardiography  10 /2012    EF normal -probably elevated left atrial pressures unable to asses due to afib.;mildly scelrotic aortic valve but  no stenosis and mildlyelevated right atrial  pressures and pulm. pressures at 45-50 mmhg with mild to mod pulm  htn no  change from 2011  . Cataract extraction, bilateral  2014  . Biopsy stomach  10/21/13    adenocarcinoma    FAMILY HISTORY: History reviewed. No pertinent family history.  SOCIAL HISTORY:  History  Substance Use Topics  . Smoking status: Former Smoker -- 1.00 packs/day for 45 years    Types: Cigarettes    Quit date: 11/21/2008  . Smokeless tobacco: Never Used  . Alcohol Use: No     Comment: quit drinking 6 years ago,    ALLERGIES: Review of patient's allergies indicates no known allergies.  MEDICATIONS:  Current Outpatient Prescriptions  Medication Sig Dispense Refill  . aspirin 81 MG EC tablet Take 81 mg by mouth daily.        Marland Kitchen atorvastatin (LIPITOR) 10 MG tablet Take 10 mg by mouth daily.        . carvedilol (COREG) 6.25 MG tablet Take 1 tablet (6.25 mg total) by mouth 2 (two) times daily with a meal.  180 tablet  2  . diltiazem (DILT-XR) 180 MG 24 hr capsule Take 1 capsule (180 mg total) by mouth daily. <please schedule appointment for future refills>  30 capsule  1  . glipiZIDE (GLUCOTROL XL) 5 MG 24 hr tablet Take 5 mg by mouth 2 (two) times daily.      Marland Kitchen lisinopril-hydrochlorothiazide (PRINZIDE,ZESTORETIC) 20-12.5 MG per tablet Take 1 tablet by mouth daily.      Marland Kitchen METFORMIN HCL PO Take 1,000 mg by mouth 2 (two) times daily.       . pioglitazone (ACTOS) 15 MG tablet Take  15 mg by mouth daily.       Marland Kitchen PROAIR HFA 108 (90 BASE) MCG/ACT inhaler Inhale 2 puffs into the lungs 4 (four) times daily as needed.      . warfarin (COUMADIN) 5 MG tablet Take 1 tablet by mouth daily or as directed  90 tablet  1   No current facility-administered medications for this encounter.    REVIEW OF SYSTEMS:  A 15 point review of systems is documented in the electronic medical record. This was obtained by the nursing staff. However, I reviewed this with the patient to discuss relevant findings and make appropriate changes.  Pertinent items are noted in HPI.  PHYSICAL EXAM:  Filed Vitals:    11/19/13 1431  BP: 123/66  Pulse: 69  Temp: 97.5 F (36.4 C)  Resp: 16  .204 lb 8 oz (92.761 kg). Pleasant male. Alert and oriented.   LABORATORY DATA:  Lab Results  Component Value Date   WBC 4.5 10/30/2013   HGB 7.9* 10/30/2013   HCT 25.8* 10/30/2013   MCV 68.8* 10/30/2013   PLT 300 10/30/2013   Lab Results  Component Value Date   NA 140 10/30/2013   K 4.4 10/30/2013   CL 106 12/30/2009   CO2 23 10/30/2013   Lab Results  Component Value Date   ALT 13 10/30/2013   AST 18 10/30/2013   ALKPHOS 97 10/30/2013   BILITOT 0.51 10/30/2013     RADIOGRAPHY: Ct Abdomen Pelvis W Contrast  10/28/2013   CLINICAL DATA:  New diagnosis of gastric carcinoma along lesser curvature. Found on endoscopy 8/18. Diabetes.  EXAM: CT ABDOMEN AND PELVIS WITH CONTRAST  TECHNIQUE: Multidetector CT imaging of the abdomen and pelvis was performed using the standard protocol following bolus administration of intravenous contrast.  CONTRAST:  177m OMNIPAQUE IOHEXOL 300 MG/ML  SOLN  BUN and creatinine were obtained on site at [facility]  [address]  Results:  BUN 24 mg/dL,  Creatinine 1.2 mg/dL.  COMPARISON:  None  FINDINGS: Lower Chest: Clear lung bases. Mild cardiomegaly with coronary artery atherosclerosis. Trace posterior pericardial fluid, likely physiologic. Small hiatal hernia.  Abdomen/Pelvis: Probable hepatic steatosis. Old granulomatous disease in the right lobe of the liver. Dilated hepatic veins and intrahepatic IVC , suggesting elevated right heart pressures.  Normal spleen. No dominant gastric lesion is seen within the fundus or body. The antrum is underdistended. Perigastric adenopathy. 1.6 x 1.5 cm perigastric node on image 22 of series 2.  A peripancreatic node (possibly within the gastrocolic ligament) measures 1.3 x 1.5 cm on image 29. Gastrohepatic ligament node of 1.8 x 1.4 cm on image 16.  Contracted gallbladder without pericholecystic inflammation. No biliary ductal dilatation.   Normal adrenal glands. Right-sided pelvic kidney. Normal left kidney.  Retroaortic left renal vein. Advanced aortic and branch vessel atherosclerosis. No retroperitoneal adenopathy.  Scattered colonic diverticula. Normal terminal ileum and appendix. Normal small bowel. Mild peritoneal thickening including along the left pericolic gutter. No evidence of omental or peritoneal disease.  Bilateral fat containing inguinal hernias. Larger on the left. No pelvic adenopathy. Normal urinary bladder and prostate.  Bones/Musculoskeletal: Osteopenia. Heterogeneous density throughout the sacrum. Mild loss of vertebral body height at L4-L5.  IMPRESSION: 1. The gastric primary is not definitely visualized. The gastric antrum is underdistended. 2. Perigastric adenopathy, highly suspicious for localize nodal metastasis. 3. Findings likely related to elevated right heart pressures and a component of fluid overload. 4. Right pelvic kidney. 5. Advanced atherosclerosis. 6. Heterogeneous density  throughout the sacrum. Nonspecific. Question Paget's disease. Correlate with any history of radiation therapy, which could have a similar appearance. 7. Hepatic steatosis.   Electronically Signed   By: Abigail Miyamoto M.D.   On: 10/28/2013 12:12   Nm Pet Image Initial (pi) Skull Base To Thigh  11/06/2013   CLINICAL DATA:  Initial treatment strategy for gastric cancer.  EXAM: NUCLEAR MEDICINE PET SKULL BASE TO THIGH  TECHNIQUE: 10.2 mCi F-18 FDG was injected intravenously. Full-ring PET imaging was performed from the skull base to thigh after the radiotracer. CT data was obtained and used for attenuation correction and anatomic localization.  FASTING BLOOD GLUCOSE:  Value: 77 mg/dl  COMPARISON:  CT abdomen pelvis dated 10/28/2013  FINDINGS: NECK  No hypermetabolic lymph nodes in the neck.  CHEST  No suspicious pulmonary nodules on the CT scan.  Prominent mediastinal nodes, including a 1.5 cm short axis precarinal node (series 4/image 66) and a 1.9  cm short axis subcarinal node (series 4/image 34), but without associated hypermetabolism.  Cardiomegaly. Coronary atherosclerosis. Mild atherosclerotic calcifications of the aortic arch.  ABDOMEN/PELVIS  Possible mild focal hypermetabolism involving the distal gastric antrum, equivocal, max SUV 9.5. Possible associated wall thickening in this region on CT (series 4/image 104). Correlate with location of lesion on endoscopy.  Adjacent 13 mm short axis perigastric node (series 4/image 101), although without significant hypermetabolism, max SUV 2.1.  Additional 1.3 cm node in the porta hepatis/adjacent to the duodenum bulb (series 4/image 112), max SUV 5.5, worrisome for nodal metastasis.  No abnormal hypermetabolic activity within the liver, pancreas, adrenal glands, or spleen.  SKELETON  No focal hypermetabolic activity to suggest skeletal metastasis.  IMPRESSION: Possible primary lesion in the distal gastric antrum, equivocal. Correlate with location of lesion on endoscopy.  Two perigastric nodes, at least one of which adjacent to the duodenum bulb demonstrates hypermetabolism, worrisome for nodal metastasis.  Otherwise, no evidence of distal metastases.   Electronically Signed   By: Julian Hy M.D.   On: 11/06/2013 12:00      IMPRESSION: Locally advanced gastric cancer with anemia  PLAN: I discussed with Patrick [name] different approaches to gastric cancer. We discussed curative pathways with operation and cure rates of 76% with more complications and side effects of treatment. We discussed palliative radiation and the goal of preventing bleeding knowing that his cancer likely would come back and or metastasize in the next few years requiring further treatment or hospice. He declined referral to a Psychologist, sport and exercise. We discussed side effects of radiation to the antrum including but not limited to nausea, vomiting, dehydration, fatigue and diarrhea.  He is interested in palliation. I scheduled him for simulation  next Tuesday and will plan on 5 weeks of daily treatment. He signed informed consent and agreed to proceed forward.  I gave him information regarding our GI support group and stomach cancer. I spent 60 minutes  face to face with the patient and more than 50% of that time was spent in counseling and/or coordination of care.   ------------------------------------------------  Thea Silversmith, MD

## 2013-11-25 ENCOUNTER — Encounter: Payer: Self-pay | Admitting: Radiation Oncology

## 2013-11-25 ENCOUNTER — Ambulatory Visit
Admission: RE | Admit: 2013-11-25 | Discharge: 2013-11-25 | Disposition: A | Discharge status: Home / Self Care | Payer: Medicare Other | Source: Ambulatory Visit | Attending: Radiation Oncology | Admitting: Radiation Oncology

## 2013-11-25 DIAGNOSIS — Z510 Encounter for antineoplastic radiation therapy: Secondary | ICD-10-CM | POA: Diagnosis not present

## 2013-11-25 DIAGNOSIS — C163 Malignant neoplasm of pyloric antrum: Secondary | ICD-10-CM

## 2013-11-25 NOTE — Progress Notes (Signed)
Name: Patrick [name]   MRN: [id number]  Date:  11/25/2013  DOB: [birth year]  Status:outpatient    DIAGNOSIS: anemia secondary to newly diagnosed gastric cancer  CONSENT VERIFIED: yes   SET UP: Patient is setup supine   IMMOBILIZATION:  The following immobilization was used: vacloc  NARRATIVE:  [name] was brought to the [place] suite.  Identity was confirmed.  All relevant records and images related to the planned course of therapy were reviewed.  Then, the patient was positioned in a stable reproducible clinical set-up for radiation therapy.  CT images were obtained.  An isocenter was placed. Skin markings were placed.  The CT images were loaded into the planning software where the target and avoidance structures were contoured.  The radiation prescription was entered and confirmed. The patient was discharged in stable condition and tolerated simulation well.    TREATMENT PLANNING NOTE:  Treatment planning then occurred. I have requested : MLC's, isodose plan, basic dose calculation  I have requested 3 dimensional simulation with DVH of cord, kidneys, bowel and GTV

## 2013-11-25 NOTE — Progress Notes (Signed)
Spoke with patient and friend today about transportation.  Gave patient [facility] and [facility].  Forwarded [employer] form to nurse for completion.

## 2013-11-28 ENCOUNTER — Telehealth: Payer: Self-pay

## 2013-11-28 DIAGNOSIS — Z510 Encounter for antineoplastic radiation therapy: Secondary | ICD-10-CM | POA: Diagnosis not present

## 2013-11-28 NOTE — Telephone Encounter (Signed)
Called patient to return call so that I may complete questions on transportation form [employer].

## 2013-12-01 ENCOUNTER — Ambulatory Visit: Payer: Medicare Other | Admitting: Podiatry

## 2013-12-01 ENCOUNTER — Ambulatory Visit (INDEPENDENT_AMBULATORY_CARE_PROVIDER_SITE_OTHER): Payer: Medicare Other | Admitting: Podiatry

## 2013-12-01 ENCOUNTER — Telehealth: Payer: Self-pay

## 2013-12-01 DIAGNOSIS — M79673 Pain in unspecified foot: Secondary | ICD-10-CM

## 2013-12-01 DIAGNOSIS — M79609 Pain in unspecified limb: Secondary | ICD-10-CM

## 2013-12-01 DIAGNOSIS — B351 Tinea unguium: Secondary | ICD-10-CM

## 2013-12-01 NOTE — Progress Notes (Signed)
   Subjective:    Patient ID: Patrick [name], male    DOB: October 12, 1945, 68 y.o.   MRN: [id number]  HPI  Pt presents for nail debridement  Review of Systems     Objective:   Physical Exam        Assessment & Plan:

## 2013-12-01 NOTE — Telephone Encounter (Signed)
Left voice message to inform patient he has been approved for [employer] transportation effective for pick up 12/02/13.He has ride for tomorrow and I will give number to call to make daily reservation [phone].

## 2013-12-01 NOTE — Progress Notes (Signed)
Subjective:     Patient ID: Patrick [name], male   DOB: 02/02/1946, 68 y.o.   MRN: [id number]  HPI patient presents with nail disease 1-5 both feet that are thick and painful and he cannot cut   Review of Systems     Objective:   Physical Exam Neurovascular status intact with thick yellow brittle nailbeds 1-5 both feet    Assessment:     Mycotic nail infection with pain 1-5 both feet    Plan:     Debris painful nailbeds 1-5 both feet with no iatrogenic bleeding noted

## 2013-12-01 NOTE — Progress Notes (Signed)
[employer] application completed over the phone with patient.Part A and B faxed to attention of [employer].Phone [phone].Fax [phone].

## 2013-12-02 ENCOUNTER — Ambulatory Visit
Admission: RE | Admit: 2013-12-02 | Discharge: 2013-12-02 | Disposition: A | Discharge status: Home / Self Care | Payer: Medicare Other | Source: Ambulatory Visit | Attending: Radiation Oncology | Admitting: Radiation Oncology

## 2013-12-02 DIAGNOSIS — Z510 Encounter for antineoplastic radiation therapy: Secondary | ICD-10-CM | POA: Diagnosis not present

## 2013-12-02 DIAGNOSIS — C163 Malignant neoplasm of pyloric antrum: Secondary | ICD-10-CM

## 2013-12-02 NOTE — Progress Notes (Signed)
  Radiation Oncology         [phone]) [phone] ________________________________  Name: Patrick [name] MRN: [id number]  Date: 12/02/2013  DOB: 09/23/1945  Simulation Verification Note  Status: outpatient  NARRATIVE: The patient was brought to the treatment unit and placed in the planned treatment position. The clinical setup was verified. Then port films were obtained and uploaded to the radiation oncology medical record software.  The treatment beams were carefully compared against the planned radiation fields. The position location and shape of the radiation fields was reviewed. The targeted volume of tissue appears appropriately covered by the radiation beams. Organs at risk appear to be excluded as planned.  Based on my personal review, I approved the simulation verification. The patient's treatment will proceed as planned.  ------------------------------------------------  Thea Silversmith, MD

## 2013-12-03 ENCOUNTER — Ambulatory Visit
Admission: RE | Admit: 2013-12-03 | Discharge: 2013-12-03 | Disposition: A | Discharge status: Home / Self Care | Payer: Medicare Other | Source: Ambulatory Visit | Attending: Radiation Oncology | Admitting: Radiation Oncology

## 2013-12-03 DIAGNOSIS — Z510 Encounter for antineoplastic radiation therapy: Secondary | ICD-10-CM | POA: Diagnosis not present

## 2013-12-03 DIAGNOSIS — C163 Malignant neoplasm of pyloric antrum: Secondary | ICD-10-CM

## 2013-12-03 NOTE — Progress Notes (Signed)
Routine of clinic and patient education provided.Teach back utililzed.Patient able to state when to be seen by doctor, 2 side effects of treatment and what to do if he develops nausea, diarrhea or pain.He has started riding with [employer].Needs a 30 day pass.Ailene Ravel will inquire about this and get back to me but did state he qualifies.

## 2013-12-04 ENCOUNTER — Ambulatory Visit
Admission: RE | Admit: 2013-12-04 | Discharge: 2013-12-04 | Disposition: A | Discharge status: Home / Self Care | Payer: Medicare Other | Source: Ambulatory Visit | Attending: Radiation Oncology | Admitting: Radiation Oncology

## 2013-12-04 DIAGNOSIS — C163 Malignant neoplasm of pyloric antrum: Secondary | ICD-10-CM | POA: Insufficient documentation

## 2013-12-04 DIAGNOSIS — Z510 Encounter for antineoplastic radiation therapy: Secondary | ICD-10-CM | POA: Insufficient documentation

## 2013-12-05 ENCOUNTER — Encounter: Payer: Self-pay | Admitting: *Deleted

## 2013-12-05 ENCOUNTER — Ambulatory Visit
Admission: RE | Admit: 2013-12-05 | Discharge: 2013-12-05 | Disposition: A | Discharge status: Home / Self Care | Payer: Medicare Other | Source: Ambulatory Visit | Attending: Radiation Oncology | Admitting: Radiation Oncology

## 2013-12-05 DIAGNOSIS — Z510 Encounter for antineoplastic radiation therapy: Secondary | ICD-10-CM | POA: Diagnosis not present

## 2013-12-05 NOTE — Progress Notes (Signed)
[place] Work  Clinical Social Work was referred by Marine scientist and Science writer for assessment of psychosocial needs due to transportation concerns.  Clinical Social Worker contacted patient at home to offer support and assess for needs. Pt reports he was unaware that he could not use regular bus pass in the [employer] pass swipe and it messed up his 30 day bus pass. Pt reports he can afford some [employer] rides, but due to bus pass being wiped clean this is a hardship. CSW team working to secure transportation assistance for ten rides through [employer] and pt feels he can purchase the additional rides on his own. Pt very appreciative and denied other concerns currently.   Clinical Social Work interventions: Transportation assistance  Loren Racer, [place] Social Worker [place]  [place] Phone: [phone] Fax: [phone]

## 2013-12-08 ENCOUNTER — Ambulatory Visit
Admission: RE | Admit: 2013-12-08 | Discharge: 2013-12-08 | Disposition: A | Discharge status: Home / Self Care | Payer: Medicare Other | Source: Ambulatory Visit | Attending: Radiation Oncology | Admitting: Radiation Oncology

## 2013-12-08 DIAGNOSIS — Z510 Encounter for antineoplastic radiation therapy: Secondary | ICD-10-CM | POA: Diagnosis not present

## 2013-12-09 ENCOUNTER — Encounter: Payer: Self-pay | Admitting: Radiation Oncology

## 2013-12-09 ENCOUNTER — Inpatient Hospital Stay
Admission: RE | Admit: 2013-12-09 | Discharge: 2013-12-09 | Disposition: A | Discharge status: Home / Self Care | Payer: Medicare Other | Source: Ambulatory Visit | Attending: Radiation Oncology | Admitting: Radiation Oncology

## 2013-12-09 ENCOUNTER — Ambulatory Visit
Admission: RE | Admit: 2013-12-09 | Discharge: 2013-12-09 | Disposition: A | Discharge status: Home / Self Care | Payer: Medicare Other | Source: Ambulatory Visit | Attending: Radiation Oncology | Admitting: Radiation Oncology

## 2013-12-09 VITALS — BP 128/65 | HR 65 | Temp 98.2°F | Resp 20 | Wt 204.1 lb

## 2013-12-09 DIAGNOSIS — Z510 Encounter for antineoplastic radiation therapy: Secondary | ICD-10-CM | POA: Diagnosis not present

## 2013-12-09 DIAGNOSIS — C163 Malignant neoplasm of pyloric antrum: Secondary | ICD-10-CM

## 2013-12-09 MED ORDER — PROCHLORPERAZINE MALEATE 10 MG PO TABS: 10.0000 mg | ORAL_TABLET | Freq: Four times a day (QID) | ORAL | Status: DC | PRN

## 2013-12-09 NOTE — Progress Notes (Addendum)
Weekly rad tx  Stomach 5tx completed, no skin changes,  no nausea, no gas, no nausea, regular bopwel movements, appetite good energy level good, no c/o pain 1:46 PM

## 2013-12-09 NOTE — Progress Notes (Signed)
Weekly Management Note Current Dose:10 Gy  Projected Dose:40 Gy   Narrative:  The patient presents for routine under treatment assessment.  CBCT/MVCT images/Port film x-rays were reviewed.  The chart was checked. Doing well. No complaints.   Physical Findings:  Unchanged  Vitals:  Filed Vitals:   12/09/13 1344  BP: 128/65  Pulse: 65  Temp: 98.2 F (36.8 C)  Resp: 20   Weight:  Wt Readings from Last 3 Encounters:  12/09/13 204 lb 1.6 oz (92.579 kg)  11/19/13 204 lb 8 oz (92.761 kg)  11/11/13 204 lb 3 oz (92.619 kg)   Lab Results  Component Value Date   WBC 4.5 10/30/2013   HGB 7.9* 10/30/2013   HCT 25.8* 10/30/2013   MCV 68.8* 10/30/2013   PLT 300 10/30/2013   Lab Results  Component Value Date   CREATININE 1.2 10/30/2013   BUN 22.6 10/30/2013   NA 140 10/30/2013   K 4.4 10/30/2013   CL 106 12/30/2009   CO2 23 10/30/2013     Impression:  The patient is tolerating radiation.  Plan:  Continue treatment as planned. Called in compazine to [facility] just in case for nausea.

## 2013-12-10 ENCOUNTER — Ambulatory Visit
Admission: RE | Admit: 2013-12-10 | Discharge: 2013-12-10 | Disposition: A | Discharge status: Home / Self Care | Payer: Medicare Other | Source: Ambulatory Visit | Attending: Radiation Oncology | Admitting: Radiation Oncology

## 2013-12-10 DIAGNOSIS — Z510 Encounter for antineoplastic radiation therapy: Secondary | ICD-10-CM | POA: Diagnosis not present

## 2013-12-11 ENCOUNTER — Ambulatory Visit
Admission: RE | Admit: 2013-12-11 | Discharge: 2013-12-11 | Disposition: A | Discharge status: Home / Self Care | Payer: Medicare Other | Source: Ambulatory Visit | Attending: Radiation Oncology | Admitting: Radiation Oncology

## 2013-12-11 DIAGNOSIS — Z510 Encounter for antineoplastic radiation therapy: Secondary | ICD-10-CM | POA: Diagnosis not present

## 2013-12-12 ENCOUNTER — Ambulatory Visit
Admission: RE | Admit: 2013-12-12 | Discharge: 2013-12-12 | Disposition: A | Discharge status: Home / Self Care | Payer: Medicare Other | Source: Ambulatory Visit | Attending: Radiation Oncology | Admitting: Radiation Oncology

## 2013-12-12 DIAGNOSIS — Z510 Encounter for antineoplastic radiation therapy: Secondary | ICD-10-CM | POA: Diagnosis not present

## 2013-12-15 ENCOUNTER — Ambulatory Visit
Admission: RE | Admit: 2013-12-15 | Discharge: 2013-12-15 | Disposition: A | Discharge status: Home / Self Care | Payer: Medicare Other | Source: Ambulatory Visit | Attending: Radiation Oncology | Admitting: Radiation Oncology

## 2013-12-15 DIAGNOSIS — Z510 Encounter for antineoplastic radiation therapy: Secondary | ICD-10-CM | POA: Diagnosis not present

## 2013-12-16 ENCOUNTER — Ambulatory Visit
Admission: RE | Admit: 2013-12-16 | Discharge: 2013-12-16 | Disposition: A | Discharge status: Home / Self Care | Payer: Medicare Other | Source: Ambulatory Visit | Attending: Radiation Oncology | Admitting: Radiation Oncology

## 2013-12-16 VITALS — BP 115/63 | HR 123 | Temp 97.5°F | Wt 199.8 lb

## 2013-12-16 DIAGNOSIS — C163 Malignant neoplasm of pyloric antrum: Secondary | ICD-10-CM

## 2013-12-16 DIAGNOSIS — Z510 Encounter for antineoplastic radiation therapy: Secondary | ICD-10-CM | POA: Diagnosis not present

## 2013-12-16 NOTE — Progress Notes (Signed)
Weekly Management Note Current Dose:20 Gy  Projected Dose:40 Gy   Narrative:  The patient presents for routine under treatment assessment.  CBCT/MVCT images/Port film x-rays were reviewed.  The chart was checked. Doing well. No nausea. Wanted to know stage of cancer.   Physical Findings:  Unchanged  Vitals:  Filed Vitals:   12/16/13 1433  BP: 115/63  Pulse: 123  Temp: 97.5 F (36.4 C)   Weight:  Wt Readings from Last 3 Encounters:  12/16/13 199 lb 12.8 oz (90.629 kg)  12/09/13 204 lb 1.6 oz (92.579 kg)  11/19/13 204 lb 8 oz (92.761 kg)   Lab Results  Component Value Date   WBC 4.5 10/30/2013   HGB 7.9* 10/30/2013   HCT 25.8* 10/30/2013   MCV 68.8* 10/30/2013   PLT 300 10/30/2013   Lab Results  Component Value Date   CREATININE 1.2 10/30/2013   BUN 22.6 10/30/2013   NA 140 10/30/2013   K 4.4 10/30/2013   CL 106 12/30/2009   CO2 23 10/30/2013     Impression:  The patient is tolerating radiation.  Plan:  Continue treatment as planned. Discussed stage with patient.

## 2013-12-17 ENCOUNTER — Ambulatory Visit: Payer: Medicare Other | Admitting: Pharmacist Clinician (PhC)/ Clinical Pharmacy Specialist

## 2013-12-17 ENCOUNTER — Ambulatory Visit
Admission: RE | Admit: 2013-12-17 | Discharge: 2013-12-17 | Disposition: A | Discharge status: Home / Self Care | Payer: Medicare Other | Source: Ambulatory Visit | Attending: Radiation Oncology | Admitting: Radiation Oncology

## 2013-12-17 DIAGNOSIS — Z510 Encounter for antineoplastic radiation therapy: Secondary | ICD-10-CM | POA: Diagnosis not present

## 2013-12-18 ENCOUNTER — Ambulatory Visit
Admission: RE | Admit: 2013-12-18 | Discharge: 2013-12-18 | Disposition: A | Discharge status: Home / Self Care | Payer: Medicare Other | Source: Ambulatory Visit | Attending: Radiation Oncology | Admitting: Radiation Oncology

## 2013-12-18 DIAGNOSIS — Z510 Encounter for antineoplastic radiation therapy: Secondary | ICD-10-CM | POA: Diagnosis not present

## 2013-12-19 ENCOUNTER — Ambulatory Visit
Admission: RE | Admit: 2013-12-19 | Discharge: 2013-12-19 | Disposition: A | Discharge status: Home / Self Care | Payer: Medicare Other | Source: Ambulatory Visit | Attending: Radiation Oncology | Admitting: Radiation Oncology

## 2013-12-19 DIAGNOSIS — Z510 Encounter for antineoplastic radiation therapy: Secondary | ICD-10-CM | POA: Diagnosis not present

## 2013-12-22 ENCOUNTER — Ambulatory Visit
Admission: RE | Admit: 2013-12-22 | Discharge: 2013-12-22 | Disposition: A | Discharge status: Home / Self Care | Payer: Medicare Other | Source: Ambulatory Visit | Attending: Radiation Oncology | Admitting: Radiation Oncology

## 2013-12-22 DIAGNOSIS — Z510 Encounter for antineoplastic radiation therapy: Secondary | ICD-10-CM | POA: Diagnosis not present

## 2013-12-23 ENCOUNTER — Ambulatory Visit
Admission: RE | Admit: 2013-12-23 | Discharge: 2013-12-23 | Disposition: A | Discharge status: Home / Self Care | Payer: Medicare Other | Source: Ambulatory Visit | Attending: Radiation Oncology | Admitting: Radiation Oncology

## 2013-12-23 ENCOUNTER — Inpatient Hospital Stay
Admission: RE | Admit: 2013-12-23 | Discharge: 2013-12-23 | Disposition: A | Discharge status: Home / Self Care | Payer: Self-pay | Source: Ambulatory Visit | Attending: Radiation Oncology | Admitting: Radiation Oncology

## 2013-12-23 ENCOUNTER — Encounter: Payer: Self-pay | Admitting: Radiation Oncology

## 2013-12-23 VITALS — BP 123/60 | HR 65 | Temp 97.8°F | Ht 69.0 in | Wt 200.0 lb

## 2013-12-23 DIAGNOSIS — C163 Malignant neoplasm of pyloric antrum: Secondary | ICD-10-CM

## 2013-12-23 DIAGNOSIS — Z510 Encounter for antineoplastic radiation therapy: Secondary | ICD-10-CM | POA: Diagnosis not present

## 2013-12-23 NOTE — Progress Notes (Signed)
  Radiation Oncology         [phone]) [phone] ________________________________  Name: Patrick [name] MRN: [id number]  Date: 12/23/2013  DOB: Feb 29, 1946  Weekly Radiation Therapy Management  TxN1 adenocarcinoma of the gastric antrum   Primary site: Stomach   Staging method: AJCC 7th Edition   Clinical: Stage Unknown ([place], N1, M0) signed by Thea Silversmith, MD on 11/19/2013  5:58 PM   Summary: Stage Unknown ([place], N1, M0)   Current Dose: 30 Gy     Planned Dose:  40 Gy  Narrative . . . . . . . . The patient presents for routine under treatment assessment.                                   The patient is without complaint. Patrick [name] continues to tolerate Patrick [name] treatments quite well any significant fatigue nausea or appetite changes                                 Set-up films were reviewed.                                 The chart was checked. Physical Findings. . .  height is 5\' 9"  (1.753 m) and weight is 200 lb (90.719 kg). Patrick [name] temperature is 97.8 F (36.6 C). Patrick [name] blood pressure is 123/60 and Patrick [name] pulse is 65. . The lungs are clear. Irregular heart rhythm consistent with atrial fibrillation.  The abdomen is soft and nontender with normal bowel sounds. Impression . . . . . . . The patient is tolerating radiation. Plan . . . . . . . . . . . . Continue treatment as planned.  ________________________________   Blair Promise, PhD, MD

## 2013-12-23 NOTE — Progress Notes (Signed)
Patrick [name] has received 15 fractions to her stomach.  He denies any fatigue, nausea, nor pain.

## 2013-12-24 ENCOUNTER — Ambulatory Visit
Admission: RE | Admit: 2013-12-24 | Discharge: 2013-12-24 | Disposition: A | Discharge status: Home / Self Care | Payer: Medicare Other | Source: Ambulatory Visit | Attending: Radiation Oncology | Admitting: Radiation Oncology

## 2013-12-24 DIAGNOSIS — C163 Malignant neoplasm of pyloric antrum: Secondary | ICD-10-CM

## 2013-12-24 DIAGNOSIS — Z510 Encounter for antineoplastic radiation therapy: Secondary | ICD-10-CM | POA: Diagnosis not present

## 2013-12-24 MED ORDER — RADIAPLEXRX EX GEL
Freq: Once | CUTANEOUS | Status: AC
  Administered 2013-12-24: 17:00:00 via TOPICAL

## 2013-12-24 NOTE — Progress Notes (Signed)
Patient came to nursing asking for skin cream.  Dry skin noted on his left abdomen.  He was given a tube of radiaplex and was instructed to apply it twice a day after treatment and at bedtime.

## 2013-12-25 ENCOUNTER — Ambulatory Visit
Admission: RE | Admit: 2013-12-25 | Discharge: 2013-12-25 | Disposition: A | Discharge status: Home / Self Care | Payer: Medicare Other | Source: Ambulatory Visit | Attending: Radiation Oncology | Admitting: Radiation Oncology

## 2013-12-25 DIAGNOSIS — Z510 Encounter for antineoplastic radiation therapy: Secondary | ICD-10-CM | POA: Diagnosis not present

## 2013-12-26 ENCOUNTER — Ambulatory Visit
Admission: RE | Admit: 2013-12-26 | Discharge: 2013-12-26 | Disposition: A | Discharge status: Home / Self Care | Payer: Medicare Other | Source: Ambulatory Visit | Attending: Radiation Oncology | Admitting: Radiation Oncology

## 2013-12-26 DIAGNOSIS — Z510 Encounter for antineoplastic radiation therapy: Secondary | ICD-10-CM | POA: Diagnosis not present

## 2013-12-29 ENCOUNTER — Ambulatory Visit
Admission: RE | Admit: 2013-12-29 | Discharge: 2013-12-29 | Disposition: A | Discharge status: Home / Self Care | Payer: Medicare Other | Source: Ambulatory Visit | Attending: Radiation Oncology | Admitting: Radiation Oncology

## 2013-12-29 DIAGNOSIS — Z510 Encounter for antineoplastic radiation therapy: Secondary | ICD-10-CM | POA: Diagnosis not present

## 2013-12-30 ENCOUNTER — Inpatient Hospital Stay
Admission: RE | Admit: 2013-12-30 | Discharge: 2013-12-30 | Disposition: A | Discharge status: Home / Self Care | Payer: Self-pay | Source: Ambulatory Visit | Attending: Radiation Oncology | Admitting: Radiation Oncology

## 2013-12-30 ENCOUNTER — Encounter: Payer: Self-pay | Admitting: Radiation Oncology

## 2013-12-30 ENCOUNTER — Ambulatory Visit
Admission: RE | Admit: 2013-12-30 | Discharge: 2013-12-30 | Disposition: A | Discharge status: Home / Self Care | Payer: Medicare Other | Source: Ambulatory Visit | Attending: Radiation Oncology | Admitting: Radiation Oncology

## 2013-12-30 VITALS — BP 125/78 | HR 85 | Temp 97.4°F | Resp 16 | Ht 69.0 in | Wt 197.8 lb

## 2013-12-30 DIAGNOSIS — C163 Malignant neoplasm of pyloric antrum: Secondary | ICD-10-CM

## 2013-12-30 DIAGNOSIS — Z510 Encounter for antineoplastic radiation therapy: Secondary | ICD-10-CM | POA: Diagnosis not present

## 2013-12-30 NOTE — Progress Notes (Signed)
Weekly rad txs, 20/20 abdomen, completed, dry skin on abdomen, using radiaplex cream now bid, no nausea,pain,diarrhea stated, appetite good, gets fatigued at times, n1 month f/u card given to pt  2:01 PM

## 2013-12-30 NOTE — Progress Notes (Signed)
  Radiation Oncology         [phone]) [phone] ________________________________  Name: Patrick [name] MRN: [id number]  Date: 12/30/2013  DOB: 1945/10/16  Weekly Radiation Therapy Management  TxN1 adenocarcinoma of the gastric antrum   Current Dose: 40 Gy     Planned Dose:  40 Gy  Narrative . . . . . . . . The patient presents for routine under treatment assessment.                                   The patient is without complaint for some mild problems with diarrhea. He denies any nausea                                 Set-up films were reviewed.                                 The chart was checked. Physical Findings. . .  height is 5\' 9"  (1.753 m) and weight is 197 lb 12.8 oz (89.721 kg). His oral temperature is 97.4 F (36.3 C). His blood pressure is 125/78 and his pulse is 85. His respiration is 16. . The lungs are clear. The heart has a regular rhythm consistent with atrial fibrillation.  The abdomen is soft and nontender with normal bowel sounds Impression . . . . . . . The patient is tolerating radiation. Plan . . . . . . . . . . . Marland Kitchen routine followup in one month  ________________________________   Blair Promise, PhD, MD

## 2013-12-31 ENCOUNTER — Ambulatory Visit: Payer: Medicare Other

## 2014-01-01 ENCOUNTER — Ambulatory Visit: Payer: Medicare Other

## 2014-01-02 ENCOUNTER — Ambulatory Visit: Payer: Medicare Other

## 2014-01-04 NOTE — Progress Notes (Signed)
  Radiation Oncology         [phone]) [phone] ________________________________  Name: Patrick [name] MRN: [id number]  Date: 12/30/2013  DOB: [birth year]  End of Treatment Note  Diagnosis:   Locally advanced stomach cancer (not an operative candidate)     Indication for treatment:  Palliative       Radiation treatment dates:   12/03/2013-12/30/2013  Site/dose:   Stomach/ 40 Gy at 2 Gy per fraction x 20 fractions  Beams/energy:   A three field technique using 2 laterals and an oblique was used for this plan. 10 and 15 MV photons were used  Narrative: The patient tolerated radiation treatment relatively well.   He had minimal nausea and no vomiting. He had some mild diarrhea.   Plan: The patient has completed radiation treatment. The patient will return to radiation oncology clinic for routine followup in one month. I advised them to call or return sooner if they have any questions or concerns related to their recovery or treatment.  ------------------------------------------------  Thea Silversmith, MD

## 2014-01-05 ENCOUNTER — Ambulatory Visit: Payer: Medicare Other

## 2014-01-06 ENCOUNTER — Ambulatory Visit: Payer: Medicare Other

## 2014-01-09 ENCOUNTER — Ambulatory Visit (INDEPENDENT_AMBULATORY_CARE_PROVIDER_SITE_OTHER): Payer: Medicare Other | Admitting: Pharmacist Clinician (PhC)/ Clinical Pharmacy Specialist

## 2014-01-09 DIAGNOSIS — I639 Cerebral infarction, unspecified: Secondary | ICD-10-CM

## 2014-01-09 DIAGNOSIS — Z7901 Long term (current) use of anticoagulants: Secondary | ICD-10-CM

## 2014-01-09 DIAGNOSIS — I4891 Unspecified atrial fibrillation: Secondary | ICD-10-CM

## 2014-01-09 LAB — POCT INR: INR: 1.7

## 2014-01-12 ENCOUNTER — Other Ambulatory Visit: Payer: Self-pay | Admitting: Cardiology

## 2014-01-13 ENCOUNTER — Other Ambulatory Visit (HOSPITAL_BASED_OUTPATIENT_CLINIC_OR_DEPARTMENT_OTHER): Payer: Medicare Other

## 2014-01-13 ENCOUNTER — Telehealth: Payer: Self-pay | Admitting: Oncology

## 2014-01-13 ENCOUNTER — Ambulatory Visit (HOSPITAL_BASED_OUTPATIENT_CLINIC_OR_DEPARTMENT_OTHER): Payer: Medicare Other | Admitting: Oncology

## 2014-01-13 VITALS — BP 138/63 | HR 75 | Temp 97.9°F | Resp 18 | Ht 69.0 in | Wt 193.5 lb

## 2014-01-13 DIAGNOSIS — C165 Malignant neoplasm of lesser curvature of stomach, unspecified: Secondary | ICD-10-CM

## 2014-01-13 DIAGNOSIS — Z23 Encounter for immunization: Secondary | ICD-10-CM

## 2014-01-13 DIAGNOSIS — C163 Malignant neoplasm of pyloric antrum: Secondary | ICD-10-CM

## 2014-01-13 DIAGNOSIS — D509 Iron deficiency anemia, unspecified: Secondary | ICD-10-CM

## 2014-01-13 DIAGNOSIS — C169 Malignant neoplasm of stomach, unspecified: Secondary | ICD-10-CM

## 2014-01-13 LAB — CBC WITH DIFFERENTIAL/PLATELET
BASO%: 0.6 % (ref 0.0–2.0)
Basophils Absolute: 0 10*3/uL (ref 0.0–0.1)
EOS ABS: 0 10*3/uL (ref 0.0–0.5)
EOS%: 0.9 % (ref 0.0–7.0)
HEMATOCRIT: 33.8 % — AB (ref 38.4–49.9)
HEMOGLOBIN: 10.6 g/dL — AB (ref 13.0–17.1)
LYMPH#: 0.7 10*3/uL — AB (ref 0.9–3.3)
LYMPH%: 18.7 % (ref 14.0–49.0)
MCH: 24 pg — ABNORMAL LOW (ref 27.2–33.4)
MCHC: 31.4 g/dL — ABNORMAL LOW (ref 32.0–36.0)
MCV: 76.5 fL — ABNORMAL LOW (ref 79.3–98.0)
MONO#: 0.3 10*3/uL (ref 0.1–0.9)
MONO%: 9.5 % (ref 0.0–14.0)
NEUT%: 70.3 % (ref 39.0–75.0)
NEUTROS ABS: 2.5 10*3/uL (ref 1.5–6.5)
Platelets: 183 10*3/uL (ref 140–400)
RBC: 4.42 10*6/uL (ref 4.20–5.82)
RDW: 22.1 % — AB (ref 11.0–14.6)
WBC: 3.5 10*3/uL — ABNORMAL LOW (ref 4.0–10.3)
nRBC: 0 % (ref 0–0)

## 2014-01-13 LAB — IRON AND TIBC CHCC
%SAT: 22 % (ref 20–55)
IRON: 60 ug/dL (ref 42–163)
TIBC: 273 ug/dL (ref 202–409)
UIBC: 212 ug/dL (ref 117–376)

## 2014-01-13 LAB — FERRITIN CHCC: FERRITIN: 72 ng/mL (ref 22–316)

## 2014-01-13 NOTE — Telephone Encounter (Signed)
Rx was sent to pharmacy electronically. 

## 2014-01-13 NOTE — Telephone Encounter (Signed)
gv adn prnted appt sched and avs for pt for NOV thru Jan 2016

## 2014-01-13 NOTE — Telephone Encounter (Signed)
gv adn printed appt sched and avs for pt for Jan 2016 °

## 2014-01-13 NOTE — Progress Notes (Signed)
Hematology and Oncology Follow Up Visit  Patrick [name] [id number] [birth year] 68 y.o. 01/13/2014 1:10 PM Patrick [name], MDMackenzie, Patrick Edelman, MD   Principle Diagnosis: 68 year old gentleman with invasive adenocarcinoma of the stomach. He presented with a large elsewhere and iron deficiency anemia related to that in August of 2015. He is status post an endoscopy and a biopsy on 10/21/2013 confirmed the diagnosis. He presented with a hemoglobin of 7.9, MCV 68.8  Prior Therapy:  Status post IV iron 1 g given on 11/04/2013. He is status post definitive radiation therapy between 12/03/2013 and 12/30/2013. He received 40 Gray in 20 fractions.  Current therapy: observation and surveillance  Interim History: [name] presents today for a followup visit. Since his last visit, he underwent radiation therapy without any complications. He did not report any nausea, vomiting or abdominal pain. He is no longer reporting any hematochezia or melena.He reports he felt better with increased energy level and less fatigued. He has not reported any further bleeding since the last visit. He does not report any hemoptysis or hematemesis. He is reported headaches or blurred vision or syncope. Does not report any fevers, chills, sweats or weight loss. Does not report any chest pain, orthopnea, palpitations or PND. Does report any cough or wheezing or shortness of breath. He has not reported any urinary symptoms of frequency urgency or hesitancy. He does not report any skeletal complaints of arthralgias or myalgias. He has not reported any lymphadenopathy or petechiae. He reports no skin rashes or lesions. His performance status still limited but have not declined. Rest of her review of systems unremarkable.      Medications: I have reviewed the patient's current medications.  Current Outpatient Prescriptions  Medication Sig Dispense Refill  . aspirin 81 MG EC tablet Take 81 mg by mouth daily.      Marland Kitchen atorvastatin  (LIPITOR) 10 MG tablet Take 10 mg by mouth daily.      . carvedilol (COREG) 6.25 MG tablet Take 1 tablet (6.25 mg total) by mouth 2 (two) times daily with a meal. 180 tablet 2  . diltiazem (DILT-XR) 180 MG 24 hr capsule Take 1 capsule (180 mg total) by mouth daily. <please schedule appointment for future refills> 30 capsule 1  . glipiZIDE (GLUCOTROL XL) 5 MG 24 hr tablet Take 5 mg by mouth 2 (two) times daily.    Marland Kitchen lisinopril-hydrochlorothiazide (PRINZIDE,ZESTORETIC) 20-12.5 MG per tablet Take 1 tablet by mouth daily.    Marland Kitchen loperamide (IMODIUM A-D) 2 MG tablet Take 2 mg by mouth 4 (four) times daily as needed for diarrhea or loose stools.    Marland Kitchen METFORMIN HCL PO Take 1,000 mg by mouth 2 (two) times daily.     . pioglitazone (ACTOS) 15 MG tablet Take 15 mg by mouth daily.     Marland Kitchen PROAIR HFA 108 (90 BASE) MCG/ACT inhaler Inhale 2 puffs into the lungs 4 (four) times daily as needed.    . prochlorperazine (COMPAZINE) 10 MG tablet Take 1 tablet (10 mg total) by mouth every 6 (six) hours as needed for nausea or vomiting. 30 tablet 0  . warfarin (COUMADIN) 5 MG tablet Take 1 tablet by mouth daily or as directed 90 tablet 1   No current facility-administered medications for this visit.     Allergies: No Known Allergies  Past Medical History, Surgical history, Social history, and Family History were reviewed and updated.   Physical Exam: Blood pressure 138/63, pulse 75, temperature 97.9 F (36.6 C), temperature source Oral, resp. rate 18,  height 5\' 9"  (1.753 m), weight 193 lb 8 oz (87.771 kg). ECOG: 1 General appearance: alert and cooperative not in any distress. Head: Normocephalic, without obvious abnormality Neck: no adenopathy Lymph nodes: Cervical, supraclavicular, and axillary nodes normal. Heart: Irregular slightly tachycardic. Lung:chest clear, no wheezing, rales, normal symmetric air entry Abdomin: soft, non-tender, without masses or organomegaly EXT:no erythema, induration, or  nodules   Lab Results: Lab Results  Component Value Date   WBC 3.5* 01/13/2014   HGB 10.6* 01/13/2014   HCT 33.8* 01/13/2014   MCV 76.5* 01/13/2014   PLT 183 01/13/2014     Chemistry      Component Value Date/Time   NA 140 10/30/2013 1337   NA 144 12/30/2009 2034   K 4.4 10/30/2013 1337   K 4.8 12/30/2009 2034   CL 106 12/30/2009 2034   CO2 23 10/30/2013 1337   CO2 26 12/30/2009 2034   BUN 22.6 10/30/2013 1337   BUN 11 12/30/2009 2034   CREATININE 1.2 10/30/2013 1337   CREATININE 1.15 12/30/2009 2034      Component Value Date/Time   CALCIUM 8.8 10/30/2013 1337   CALCIUM 9.4 12/30/2009 2034   ALKPHOS 97 10/30/2013 1337   ALKPHOS 60 12/30/2009 2034   AST 18 10/30/2013 1337   AST 12 12/30/2009 2034   ALT 13 10/30/2013 1337   ALT <8 U/L 12/30/2009 2034   BILITOT 0.51 10/30/2013 1337   BILITOT 0.3 12/30/2009 2034        Impression and Plan:  68 year old gentleman with the following issues:  1. Gastric adenocarcinoma presented with an ulcer in the lesser curvature of the stomach that is biopsy proven. PET/CT scan obtained on 11/06/2013 showed a possible primary in the gastric antrum and to epigastric lymph node at least one of which adjacent to the duodenum there is worrisome for nodal metastasis. He is status post radiation therapy completed successfully. The plan is to restage him in 2 months with a repeat PET scan. If he does not have metastatic disease, we'll continue with observation and surveillance. If he has signs and symptoms of advanced cancer that is symptomatic we can consider palliative chemotherapy. He is a poor candidate for aggressive chemotherapy given his poor performance status and health condition.    2. Iron deficiency anemia: He is status post IV iron and tolerated it well. His hemoglobin is much improved. His iron studies are pending from today.  OZDGUY,QIHKV, MD 11/10/20151:10 PM

## 2014-01-28 ENCOUNTER — Ambulatory Visit (INDEPENDENT_AMBULATORY_CARE_PROVIDER_SITE_OTHER): Payer: Medicare Other | Admitting: Pharmacist Clinician (PhC)/ Clinical Pharmacy Specialist

## 2014-01-28 DIAGNOSIS — Z7901 Long term (current) use of anticoagulants: Secondary | ICD-10-CM

## 2014-01-28 DIAGNOSIS — I639 Cerebral infarction, unspecified: Secondary | ICD-10-CM

## 2014-01-28 DIAGNOSIS — I4891 Unspecified atrial fibrillation: Secondary | ICD-10-CM

## 2014-01-28 LAB — POCT INR: INR: 2.1

## 2014-02-06 ENCOUNTER — Ambulatory Visit
Admission: RE | Admit: 2014-02-06 | Discharge: 2014-02-06 | Disposition: A | Discharge status: Home / Self Care | Payer: Medicare Other | Source: Ambulatory Visit | Attending: Radiation Oncology | Admitting: Radiation Oncology

## 2014-02-06 NOTE — Progress Notes (Signed)
One month follow up completion of radiation to stomach.Denies pain or nausea.Weight maintained.scheduled for PET on March 17, 2014.

## 2014-02-16 ENCOUNTER — Other Ambulatory Visit: Payer: Self-pay | Admitting: Cardiology

## 2014-02-16 NOTE — Telephone Encounter (Signed)
Rx has been sent to the pharmacy electronically. ° °

## 2014-02-19 ENCOUNTER — Other Ambulatory Visit: Payer: Self-pay | Admitting: Cardiology

## 2014-02-19 MED ORDER — DILTIAZEM HCL ER 180 MG PO CP24: 180.0000 mg | ORAL_CAPSULE | Freq: Every day | ORAL | Status: DC

## 2014-02-19 NOTE — Telephone Encounter (Signed)
Pt called in needing a refill on his Diltiazem 180 sent to the [facility] on [place].   Thanks

## 2014-02-19 NOTE — Telephone Encounter (Signed)
Rx was sent to pharmacy electronically. OV 05/05/2014

## 2014-03-02 ENCOUNTER — Other Ambulatory Visit: Payer: Medicare Other

## 2014-03-11 ENCOUNTER — Ambulatory Visit: Payer: Medicare Other | Admitting: Pharmacist Clinician (PhC)/ Clinical Pharmacy Specialist

## 2014-03-13 ENCOUNTER — Ambulatory Visit (INDEPENDENT_AMBULATORY_CARE_PROVIDER_SITE_OTHER): Payer: Medicare Other | Admitting: Pharmacist Clinician (PhC)/ Clinical Pharmacy Specialist

## 2014-03-13 DIAGNOSIS — Z7901 Long term (current) use of anticoagulants: Secondary | ICD-10-CM | POA: Diagnosis not present

## 2014-03-13 DIAGNOSIS — I639 Cerebral infarction, unspecified: Secondary | ICD-10-CM | POA: Diagnosis not present

## 2014-03-13 DIAGNOSIS — I4891 Unspecified atrial fibrillation: Secondary | ICD-10-CM | POA: Diagnosis not present

## 2014-03-13 LAB — POCT INR: INR: 2

## 2014-03-17 ENCOUNTER — Ambulatory Visit (HOSPITAL_COMMUNITY)
Admission: RE | Admit: 2014-03-17 | Discharge: 2014-03-17 | Disposition: A | Discharge status: Home / Self Care | Payer: Medicare Other | Source: Ambulatory Visit | Attending: Oncology | Admitting: Oncology

## 2014-03-17 ENCOUNTER — Other Ambulatory Visit (HOSPITAL_BASED_OUTPATIENT_CLINIC_OR_DEPARTMENT_OTHER): Payer: Medicare Other

## 2014-03-17 ENCOUNTER — Encounter (HOSPITAL_COMMUNITY): Payer: Self-pay

## 2014-03-17 DIAGNOSIS — C165 Malignant neoplasm of lesser curvature of stomach, unspecified: Secondary | ICD-10-CM | POA: Diagnosis not present

## 2014-03-17 DIAGNOSIS — C163 Malignant neoplasm of pyloric antrum: Secondary | ICD-10-CM

## 2014-03-17 DIAGNOSIS — C169 Malignant neoplasm of stomach, unspecified: Secondary | ICD-10-CM | POA: Diagnosis not present

## 2014-03-17 LAB — CBC WITH DIFFERENTIAL/PLATELET
BASO%: 1.4 % (ref 0.0–2.0)
Basophils Absolute: 0.1 10*3/uL (ref 0.0–0.1)
EOS%: 1.3 % (ref 0.0–7.0)
Eosinophils Absolute: 0 10*3/uL (ref 0.0–0.5)
HCT: 33.6 % — ABNORMAL LOW (ref 38.4–49.9)
HGB: 10.5 g/dL — ABNORMAL LOW (ref 13.0–17.1)
LYMPH%: 15.4 % (ref 14.0–49.0)
MCH: 25.2 pg — AB (ref 27.2–33.4)
MCHC: 31.3 g/dL — ABNORMAL LOW (ref 32.0–36.0)
MCV: 80.3 fL (ref 79.3–98.0)
MONO#: 0.4 10*3/uL (ref 0.1–0.9)
MONO%: 10.4 % (ref 0.0–14.0)
NEUT#: 2.7 10*3/uL (ref 1.5–6.5)
NEUT%: 71.5 % (ref 39.0–75.0)
Platelets: 203 10*3/uL (ref 140–400)
RBC: 4.18 10*6/uL — AB (ref 4.20–5.82)
RDW: 16.4 % — ABNORMAL HIGH (ref 11.0–14.6)
WBC: 3.8 10*3/uL — AB (ref 4.0–10.3)
lymph#: 0.6 10*3/uL — ABNORMAL LOW (ref 0.9–3.3)

## 2014-03-17 LAB — COMPREHENSIVE METABOLIC PANEL (CC13)
ALBUMIN: 3.6 g/dL (ref 3.5–5.0)
ALK PHOS: 126 U/L (ref 40–150)
ALT: 16 U/L (ref 0–55)
ANION GAP: 8 meq/L (ref 3–11)
AST: 19 U/L (ref 5–34)
BUN: 17.5 mg/dL (ref 7.0–26.0)
CALCIUM: 8.9 mg/dL (ref 8.4–10.4)
CO2: 28 mEq/L (ref 22–29)
CREATININE: 1 mg/dL (ref 0.7–1.3)
Chloride: 108 mEq/L (ref 98–109)
EGFR: 85 mL/min/{1.73_m2} — AB (ref >90)
GLUCOSE: 79 mg/dL (ref 70–140)
Potassium: 4 mEq/L (ref 3.5–5.1)
Sodium: 144 mEq/L (ref 136–145)
Total Bilirubin: 0.59 mg/dL (ref 0.20–1.20)
Total Protein: 6.9 g/dL (ref 6.4–8.3)

## 2014-03-17 LAB — GLUCOSE, CAPILLARY: GLUCOSE-CAPILLARY: 72 mg/dL (ref 70–99)

## 2014-03-17 MED ORDER — FLUDEOXYGLUCOSE F - 18 (FDG) INJECTION
9.7700 | Freq: Once | INTRAVENOUS | Status: AC | PRN
  Administered 2014-03-17: 9.77 via INTRAVENOUS

## 2014-03-18 DIAGNOSIS — I10 Essential (primary) hypertension: Secondary | ICD-10-CM | POA: Diagnosis not present

## 2014-03-18 DIAGNOSIS — E1329 Other specified diabetes mellitus with other diabetic kidney complication: Secondary | ICD-10-CM | POA: Diagnosis not present

## 2014-03-20 ENCOUNTER — Telehealth: Payer: Self-pay | Admitting: Oncology

## 2014-03-20 ENCOUNTER — Ambulatory Visit (HOSPITAL_BASED_OUTPATIENT_CLINIC_OR_DEPARTMENT_OTHER): Payer: Medicare Other | Admitting: Oncology

## 2014-03-20 VITALS — BP 124/64 | HR 71 | Temp 97.5°F | Resp 19 | Ht 69.0 in | Wt 200.0 lb

## 2014-03-20 DIAGNOSIS — C165 Malignant neoplasm of lesser curvature of stomach, unspecified: Secondary | ICD-10-CM

## 2014-03-20 DIAGNOSIS — C163 Malignant neoplasm of pyloric antrum: Secondary | ICD-10-CM

## 2014-03-20 DIAGNOSIS — D509 Iron deficiency anemia, unspecified: Secondary | ICD-10-CM

## 2014-03-20 NOTE — Telephone Encounter (Signed)
Pt confirmed labs/ov per 01/15 POF, gave pt AVS.... KJ

## 2014-03-20 NOTE — Progress Notes (Signed)
Hematology and Oncology Follow Up Visit  Patrick [name] [id number] 1945-07-29 69 y.o. 03/20/2014 2:45 PM Patrick [name], MDMackenzie, Patrick Edelman, MD   Principle Diagnosis: 69 year old gentleman with invasive adenocarcinoma of the stomach. He presented with a large elsewhere and iron deficiency anemia related to that in August of 2015. He is status post an endoscopy and a biopsy on 10/21/2013 confirmed the diagnosis. He presented with a hemoglobin of 7.9, MCV 68.8  Prior Therapy:  Status post IV iron 1 g given on 11/04/2013. He is status post definitive radiation therapy between 12/03/2013 and 12/30/2013. He received 40 Gray in 20 fractions.  Current therapy: observation and surveillance  Interim History: Mr. Patrick [name] presents today for a followup visit. Since his last visit, he continues to do very well without any new complaints. He does not report any nausea, vomiting or abdominal pain. He is no longer reporting any hematochezia or melena. He reports increased activity level and he has continued to drive at this time. He does not report any hemoptysis or hematemesis. He is reported headaches or blurred vision or syncope. Does not report any fevers, chills, sweats or weight loss. Does not report any chest pain, orthopnea, palpitations or PND. Does report any cough or wheezing or shortness of breath. He has not reported any urinary symptoms of frequency urgency or hesitancy. He does not report any skeletal complaints of arthralgias or myalgias. He has not reported any lymphadenopathy or petechiae. He reports no skin rashes or lesions. His performance status still limited but have not declined. Rest of her review of systems unremarkable.      Medications: I have reviewed the patient's current medications.  Current Outpatient Prescriptions  Medication Sig Dispense Refill  . aspirin 81 MG EC tablet Take 81 mg by mouth daily.      Marland Kitchen atorvastatin (LIPITOR) 10 MG tablet Take 10 mg by mouth daily.      .  carvedilol (COREG) 6.25 MG tablet Take 1 tablet (6.25 mg total) by mouth 2 (two) times daily with a meal. 180 tablet 2  . diltiazem (DILT-XR) 180 MG 24 hr capsule Take 1 capsule (180 mg total) by mouth daily. <Appointment 05/05/2014 with Dr. Ellyn Hack 30 capsule 2  . glipiZIDE (GLUCOTROL XL) 5 MG 24 hr tablet Take 5 mg by mouth 2 (two) times daily.    Marland Kitchen lisinopril-hydrochlorothiazide (PRINZIDE,ZESTORETIC) 20-12.5 MG per tablet Take 1 tablet by mouth daily.    Marland Kitchen loperamide (IMODIUM A-D) 2 MG tablet Take 2 mg by mouth 4 (four) times daily as needed for diarrhea or loose stools.    Marland Kitchen METFORMIN HCL PO Take 1,000 mg by mouth 2 (two) times daily.     . pioglitazone (ACTOS) 15 MG tablet Take 15 mg by mouth daily.     Marland Kitchen PROAIR HFA 108 (90 BASE) MCG/ACT inhaler Inhale 2 puffs into the lungs 4 (four) times daily as needed.    . prochlorperazine (COMPAZINE) 10 MG tablet Take 1 tablet (10 mg total) by mouth every 6 (six) hours as needed for nausea or vomiting. 30 tablet 0  . warfarin (COUMADIN) 5 MG tablet Take 1 tablet by mouth daily or as directed 90 tablet 1   No current facility-administered medications for this visit.     Allergies: No Known Allergies  Past Medical History, Surgical history, Social history, and Family History were reviewed and updated.   Physical Exam: Blood pressure 124/64, pulse 71, temperature 97.5 F (36.4 C), temperature source Oral, resp. rate 19, height 5\' 9"  (1.753 m), weight  200 lb (90.719 kg), SpO2 100 %. ECOG: 1 General appearance: alert and cooperative  Head: Normocephalic, without obvious abnormality Neck: no adenopathy Lymph nodes: Cervical, supraclavicular, and axillary nodes normal. Heart: Irregular slightly tachycardic. Lung:chest clear, no wheezing, rales, normal symmetric air entry Abdomin: soft, non-tender, without masses or organomegaly EXT:no erythema, induration, or nodules   Lab Results: Lab Results  Component Value Date   WBC 3.8* 03/17/2014   HGB  10.5* 03/17/2014   HCT 33.6* 03/17/2014   MCV 80.3 03/17/2014   PLT 203 03/17/2014     Chemistry      Component Value Date/Time   NA 144 03/17/2014 0842   NA 144 12/30/2009 2034   K 4.0 03/17/2014 0842   K 4.8 12/30/2009 2034   CL 106 12/30/2009 2034   CO2 28 03/17/2014 0842   CO2 26 12/30/2009 2034   BUN 17.5 03/17/2014 0842   BUN 11 12/30/2009 2034   CREATININE 1.0 03/17/2014 0842   CREATININE 1.15 12/30/2009 2034      Component Value Date/Time   CALCIUM 8.9 03/17/2014 0842   CALCIUM 9.4 12/30/2009 2034   ALKPHOS 126 03/17/2014 0842   ALKPHOS 60 12/30/2009 2034   AST 19 03/17/2014 0842   AST 12 12/30/2009 2034   ALT 16 03/17/2014 0842   ALT <8 U/L 12/30/2009 2034   BILITOT 0.59 03/17/2014 0842   BILITOT 0.3 12/30/2009 2034      EXAM: NUCLEAR MEDICINE PET SKULL BASE TO THIGH  TECHNIQUE: 9.8 mCi F-18 FDG was injected intravenously. Full-ring PET imaging was performed from the skull base to thigh after the radiotracer. CT data was obtained and used for attenuation correction and anatomic localization.  FASTING BLOOD GLUCOSE: Value: 72 mg/dl  COMPARISON: 11/06/2013  FINDINGS: NECK  No hypermetabolic lymph nodes in the neck.  CHEST  No hypermetabolic mediastinal or hilar nodes. No suspicious pulmonary nodules on the CT scan.  ABDOMEN/PELVIS  No abnormal hypermetabolic activity within the liver, pancreas, adrenal glands, or spleen.  No focal hypermetabolism seen in the gastric antrum on today's exam. A 1 cm perigastric lymph node remains stable in size on image 99 of series 4, but shows no hypermetabolic activity on today's exam. Other previous hypermetabolic lymph node adjacent to the proximal duodenum is no longer visualized. A 1 cm lymph node is seen in the gastrohepatic ligament on image 98 of series 4. This lymph node now shows hypermetabolic activity with SUV max of 4.9, which was not seen on previous exam, and lymph node metastasis  cannot be excluded. No other hypermetabolic lymph nodes identified within the abdomen or pelvis.  Congenital right pelvic kidney again noted.  SKELETON  No focal hypermetabolic activity to suggest skeletal metastasis.  IMPRESSION: 1 cm gastrohepatic ligament lymph node remains stable in size but now shows new hypermetabolic activity; lymph node metastasis cannot be excluded. No persistent hypermetabolic activity seen in previously noted perigastric lymph nodes.  No other hypermetabolic lymphadenopathy or masses identified.   Impression and Plan:  69 year old gentleman with the following issues:  1. Gastric adenocarcinoma presented with an ulcer in the lesser curvature of the stomach that is biopsy proven. He is status post radiation therapy completed successfully. PET scan on 03/17/2014 was discussed with the patient today and did not really show any clear-cut bulky metastasis. He continues to have small gastrohepatic lymph node with some activity that could suggest metastatic disease.  I discussed the risks and benefits of systemic chemotherapy versus observation and surveillance. Given his age, performance status  and lack of bulky disease and certainly lack of symptoms we have elected to proceed with observation and surveillance and you systemic chemotherapy when we have documented measurable disease.    2. Iron deficiency anemia: He is status post IV iron and tolerated it well. His hemoglobin continues to be stable.  3. Follow-up: Will be in 3 months to check on his clinical status and repeat imaging studies in 6 months.  Zola Button, MD 1/15/20162:45 PM

## 2014-03-25 ENCOUNTER — Other Ambulatory Visit: Payer: Self-pay | Admitting: Internal Medicine

## 2014-03-25 DIAGNOSIS — R7402 Elevation of levels of lactic acid dehydrogenase (LDH): Secondary | ICD-10-CM

## 2014-03-25 DIAGNOSIS — I10 Essential (primary) hypertension: Secondary | ICD-10-CM | POA: Diagnosis not present

## 2014-03-25 DIAGNOSIS — R740 Nonspecific elevation of levels of transaminase and lactic acid dehydrogenase [LDH]: Principal | ICD-10-CM

## 2014-03-25 DIAGNOSIS — E1165 Type 2 diabetes mellitus with hyperglycemia: Secondary | ICD-10-CM | POA: Diagnosis not present

## 2014-03-25 DIAGNOSIS — I480 Paroxysmal atrial fibrillation: Secondary | ICD-10-CM | POA: Diagnosis not present

## 2014-03-25 DIAGNOSIS — N182 Chronic kidney disease, stage 2 (mild): Secondary | ICD-10-CM | POA: Diagnosis not present

## 2014-03-30 ENCOUNTER — Ambulatory Visit (INDEPENDENT_AMBULATORY_CARE_PROVIDER_SITE_OTHER): Payer: PRIVATE HEALTH INSURANCE | Admitting: Podiatry

## 2014-03-30 DIAGNOSIS — M79673 Pain in unspecified foot: Secondary | ICD-10-CM

## 2014-03-30 DIAGNOSIS — B351 Tinea unguium: Secondary | ICD-10-CM

## 2014-03-30 NOTE — Patient Instructions (Signed)
Diabetes and Foot Care Diabetes may cause you to have problems because of poor blood supply (circulation) to your feet and legs. This may cause the skin on your feet to become thinner, break easier, and heal more slowly. Your skin may become dry, and the skin may peel and crack. You may also have nerve damage in your legs and feet causing decreased feeling in them. You may not notice minor injuries to your feet that could lead to infections or more serious problems. Taking care of your feet is one of the most important things you can do for yourself.  HOME CARE INSTRUCTIONS  Wear shoes at all times, even in the house. Do not go barefoot. Bare feet are easily injured.  Check your feet daily for blisters, cuts, and redness. If you cannot see the bottom of your feet, use a mirror or ask someone for help.  Wash your feet with warm water (do not use hot water) and mild soap. Then pat your feet and the areas between your toes until they are completely dry. Do not soak your feet as this can dry your skin.  Apply a moisturizing lotion or petroleum jelly (that does not contain alcohol and is unscented) to the skin on your feet and to dry, brittle toenails. Do not apply lotion between your toes.  Trim your toenails straight across. Do not dig under them or around the cuticle. File the edges of your nails with an emery board or nail file.  Do not cut corns or calluses or try to remove them with medicine.  Wear clean socks or stockings every day. Make sure they are not too tight. Do not wear knee-high stockings since they may decrease blood flow to your legs.  Wear shoes that fit properly and have enough cushioning. To break in new shoes, wear them for just a few hours a day. This prevents you from injuring your feet. Always look in your shoes before you put them on to be sure there are no objects inside.  Do not cross your legs. This may decrease the blood flow to your feet.  If you find a minor scrape,  cut, or break in the skin on your feet, keep it and the skin around it clean and dry. These areas may be cleansed with mild soap and water. Do not cleanse the area with peroxide, alcohol, or iodine.  When you remove an adhesive bandage, be sure not to damage the skin around it.  If you have a wound, look at it several times a day to make sure it is healing.  Do not use heating pads or hot water bottles. They may burn your skin. If you have lost feeling in your feet or legs, you may not know it is happening until it is too late.  Make sure your health care provider performs a complete foot exam at least annually or more often if you have foot problems. Report any cuts, sores, or bruises to your health care provider immediately. SEEK MEDICAL CARE IF:   You have an injury that is not healing.  You have cuts or breaks in the skin.  You have an ingrown nail.  You notice redness on your legs or feet.  You feel burning or tingling in your legs or feet.  You have pain or cramps in your legs and feet.  Your legs or feet are numb.  Your feet always feel cold. SEEK IMMEDIATE MEDICAL CARE IF:   There is increasing redness,   swelling, or pain in or around a wound.  There is a red line that goes up your leg.  Pus is coming from a wound.  You develop a fever or as directed by your health care provider.  You notice a bad smell coming from an ulcer or wound. Document Released: 02/18/2000 Document Revised: 10/23/2012 Document Reviewed: 07/30/2012 Exit[employer] Patient Information 2015 [employer]. This information is not intended to replace advice given to you by your health care provider. Make sure you discuss any questions you have with your health care provider.  

## 2014-04-01 NOTE — Progress Notes (Signed)
Subjective:     Patient ID: Patrick [name], male   DOB: 1945-06-15, 69 y.o.   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

## 2014-04-03 ENCOUNTER — Ambulatory Visit
Admission: RE | Admit: 2014-04-03 | Discharge: 2014-04-03 | Disposition: A | Discharge status: Home / Self Care | Payer: Medicare Other | Source: Ambulatory Visit | Attending: Internal Medicine | Admitting: Internal Medicine

## 2014-04-03 DIAGNOSIS — R740 Nonspecific elevation of levels of transaminase and lactic acid dehydrogenase [LDH]: Principal | ICD-10-CM

## 2014-04-03 DIAGNOSIS — R7402 Elevation of levels of lactic acid dehydrogenase (LDH): Secondary | ICD-10-CM

## 2014-04-03 DIAGNOSIS — K760 Fatty (change of) liver, not elsewhere classified: Secondary | ICD-10-CM | POA: Diagnosis not present

## 2014-04-03 DIAGNOSIS — K824 Cholesterolosis of gallbladder: Secondary | ICD-10-CM | POA: Diagnosis not present

## 2014-04-14 ENCOUNTER — Other Ambulatory Visit: Payer: Self-pay | Admitting: Pharmacist Clinician (PhC)/ Clinical Pharmacy Specialist

## 2014-04-24 ENCOUNTER — Ambulatory Visit (INDEPENDENT_AMBULATORY_CARE_PROVIDER_SITE_OTHER): Payer: PRIVATE HEALTH INSURANCE | Admitting: Pharmacist Clinician (PhC)/ Clinical Pharmacy Specialist

## 2014-04-24 DIAGNOSIS — Z7901 Long term (current) use of anticoagulants: Secondary | ICD-10-CM | POA: Diagnosis not present

## 2014-04-24 DIAGNOSIS — I639 Cerebral infarction, unspecified: Secondary | ICD-10-CM

## 2014-04-24 DIAGNOSIS — I4891 Unspecified atrial fibrillation: Secondary | ICD-10-CM

## 2014-04-24 LAB — POCT INR: INR: 1.7

## 2014-05-05 ENCOUNTER — Ambulatory Visit (INDEPENDENT_AMBULATORY_CARE_PROVIDER_SITE_OTHER): Payer: Medicare Other | Admitting: *Deleted

## 2014-05-05 ENCOUNTER — Ambulatory Visit (INDEPENDENT_AMBULATORY_CARE_PROVIDER_SITE_OTHER): Payer: Medicare Other | Admitting: Cardiology

## 2014-05-05 VITALS — BP 102/70 | HR 78 | Ht 72.0 in | Wt 194.3 lb

## 2014-05-05 DIAGNOSIS — I10 Essential (primary) hypertension: Secondary | ICD-10-CM

## 2014-05-05 DIAGNOSIS — I639 Cerebral infarction, unspecified: Secondary | ICD-10-CM

## 2014-05-05 DIAGNOSIS — Z7901 Long term (current) use of anticoagulants: Secondary | ICD-10-CM

## 2014-05-05 DIAGNOSIS — I4891 Unspecified atrial fibrillation: Secondary | ICD-10-CM | POA: Diagnosis not present

## 2014-05-05 DIAGNOSIS — I481 Persistent atrial fibrillation: Secondary | ICD-10-CM

## 2014-05-05 DIAGNOSIS — I4819 Other persistent atrial fibrillation: Secondary | ICD-10-CM

## 2014-05-05 DIAGNOSIS — E785 Hyperlipidemia, unspecified: Secondary | ICD-10-CM

## 2014-05-05 NOTE — Patient Instructions (Addendum)
AN APPOINTMENT WITH [facility]- COUMADIN CLINIC CHECK-IN 6 WEEKS    NO CHANGE IN MEDICATION-TAKE LISINOPRIL IN THE MORNING  TAKE DILTIAZEM  IN THE EVENING   Your physician wants you to follow-up in  12  Month Dr Ellyn Hack.  You will receive a reminder letter in the mail two months in advance. If you don't receive a letter, please call our office to schedule the follow-up appointment.

## 2014-05-06 LAB — POCT INR: INR: 2.1

## 2014-05-07 ENCOUNTER — Encounter: Payer: Self-pay | Admitting: Cardiology

## 2014-05-07 NOTE — Progress Notes (Signed)
PCP: Thressa Sheller, MD  Clinic Note: Chief Complaint  Patient presents with  . Follow-up    overdue follow-up, pt denied chest pain and SOB  . Atrial Fibrillation   HPI: Patrick [name] is a 69 y.o. male with a PMH below who presents today for long overdue follow-up of recurrent paroxysmal/persistent atrial fibrillation complicated in the past by a stroke. He apparently was not placed on the callback list and did not have a appointment scheduled at the appropriate time. I last saw him in September 2014.  In the interval since last seen him, he was diagnosed with "stomach cancer "(invasive adenocarcinoma) he was also noted to have iron deficiency anemia back in August 2015. He was treated with radiation, but has not been on chemotherapy and there was no suggestion of operative treatment.  Past Medical History  Diagnosis Date  . Diabetes mellitus   . High blood pressure   . Stroke 2008    residual left sided weakness  . Dyslipidemia   . Persistent atrial fibrillation 01/14/2009    afib--cardioversion successful x1 shock; recurrent A. fib  . Bilateral cataracts 2014     status post extraction  . Stomach cancer 10/21/13  . 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 Hernia repair    . Cardiac catheterization  01/15/2009    LV 40%, no occlusive coronary disease,norenal artery stenosis or abdmoninal aotric aneurysm  . Doppler echocardiography  10 /2012    EF normal -probably elevated left atrial pressures unable to asses due to afib.;mildly scelrotic aortic valve but  no stenosis and mildlyelevated right atrial  pressures and pulm. pressures at 45-50 mmhg with mild to mod pulm  htn no change from 2011  . Cataract extraction, bilateral  2014  . Biopsy stomach  10/21/13    adenocarcinoma   Interval History: Patrick [name] presents today really without any major complaints.  He denies any sensation of being in or out of A. fib. No sensation of palpitations or rapid heartbeats. He may get short of breath if he overexerts himself, but with his routine activity level denies any chest tightness or pressure with rest or routine exertion. He denies any heart failure symptoms of PND, orthopnea or edema. No palpitations, lightheadedness, dizziness, weakness or syncope/near syncope. No TIA/amaurosis fugax symptoms. He seems to be taking his issues with stomach cancer and stride. Overall seems to feel pretty well. Because of all the treatment time, he has become less active than he had been before. His weight is remains stable, but he remains intermittently dyspneic for exertional standpoint that is mostly related to deconditioning.  ROS: A comprehensive was performed. Review of Systems  Constitutional: Negative for malaise/fatigue.  HENT: Negative for nosebleeds.   Respiratory: Positive for shortness of breath (rrarely with exertion). Negative for hemoptysis.   Cardiovascular: Negative for claudication and leg swelling.  Gastrointestinal: Negative for blood in stool and melena.  Genitourinary: Negative for hematuria.  Musculoskeletal: Positive for back pain and joint pain.  Neurological: Negative for dizziness, loss of consciousness and headaches.  Endo/Heme/Allergies: Does not bruise/bleed easily.  Psychiatric/Behavioral: Negative for depression. The patient is not nervous/anxious.   All other systems reviewed and are negative.   Current Outpatient Prescriptions on File Prior to Visit  Medication Sig Dispense Refill  . aspirin 81 MG EC tablet Take 81 mg by mouth daily.      Marland Kitchen atorvastatin (LIPITOR) 10  MG tablet Take 10 mg by mouth daily.      . carvedilol (COREG) 6.25 MG tablet Take 1 tablet (6.25 mg total) by mouth 2 (two) times daily with a meal. 180 tablet 2  . diltiazem (DILT-XR) 180 MG 24 hr capsule Take 1 capsule (180 mg total) by mouth daily. <Appointment 05/05/2014  with Dr. Ellyn Hack 30 capsule 2  . glipiZIDE (GLUCOTROL XL) 5 MG 24 hr tablet Take 5 mg by mouth 2 (two) times daily.    Marland Kitchen lisinopril-hydrochlorothiazide (PRINZIDE,ZESTORETIC) 20-12.5 MG per tablet Take 1 tablet by mouth daily.    Marland Kitchen loperamide (IMODIUM A-D) 2 MG tablet Take 2 mg by mouth 4 (four) times daily as needed for diarrhea or loose stools.    Marland Kitchen METFORMIN HCL PO Take 1,000 mg by mouth 2 (two) times daily.     . pioglitazone (ACTOS) 15 MG tablet Take 15 mg by mouth daily.     Marland Kitchen PROAIR HFA 108 (90 BASE) MCG/ACT inhaler Inhale 2 puffs into the lungs 4 (four) times daily as needed.    . prochlorperazine (COMPAZINE) 10 MG tablet Take 1 tablet (10 mg total) by mouth every 6 (six) hours as needed for nausea or vomiting. 30 tablet 0  . warfarin (COUMADIN) 5 MG tablet Take 1 tablet by mouth daily or as directed by coumadin clinic 90 tablet 1   No current facility-administered medications on file prior to visit.   No Known Allergies  SOCIAL AND FAMILY HISTORY REVIEWED IN EPIC -- No change  Wt Readings from Last 3 Encounters:  05/05/14 194 lb 4.8 oz (88.134 kg)  03/20/14 200 lb (90.719 kg)  02/06/14 196 lb 12.8 oz (89.268 kg)    PHYSICAL EXAM BP 102/70 mmHg  Pulse 78  Ht 6' (1.829 m)  Wt 194 lb 4.8 oz (88.134 kg)  BMI 26.35 kg/m2 General appearance: alert, cooperative, appears stated age, no distress and Healthy-appearing, well-nourished and well-groomed.Answers questions appropriately. Mild speech deficit, and mild gait instability, walking with cane. HEENT: [place]/AT, EOMI, MMM, anicteric sclera; very poor dentition with only 1 front tooth. Neck: no adenopathy, no carotid bruit, pulsatile JVP with cannon A waves  but no JVD;  and supple, symmetrical, trachea midline Lungs: clear to auscultation bilaterally, normal percussion bilaterally and Nonlabored, good air movement. Heart: irregularly irregular rhythm, S1, S2 normal, no S3 or S4 and Murmurs or rubs Abdomen: soft, non-tender; bowel  sounds normal; no masses, no organomegaly Extremities: extremities normal, atraumatic, no cyanosis or edema, no edema, redness or tenderness in the calves or thighs and no ulcers, gangrene or trophic changes Pulses: 2+ and symmetric Neurologic: Grossly normal - mild facial droop but is probably more related to poor dentition   Adult ECG Report  Rate: 78 ;  Rhythm: atrial fibrillation with controlled rate.  Narrative Interpretation: Otherwise normal EKG  Recent Labs:   Lab Results  Component Value Date   CREATININE 1.0 03/17/2014   other labs reviewed in Epic relatively normal. Lipids are not included  ASSESSMENT / PLAN: Persistent atrial fibrillation He continues to be well rate controlled with beta blocker and calcium channel blocker. Anticoagulated with warfarin. He seems to be having a stable hemoglobin in the setting of his stomach cancer. This is being monitored closely by his oncologist. No recurrent stroke symptoms.   Essential hypertension Excellent blood pressure control today on beta blocker and calcium channel blocker. He is also on an ACE inhibitor/HCTZ.  today he is borderline hypotensive. If his pressures continue to be low,  would consider decreasing the lisinopril/HCTZ dose in half. Thankfully he denies any orthostatic hypotension symptoms.   Dyslipidemia, goal LDL below 130 - on statin; followed by PCP On statin. Monitored by PCP. I have not seen any recent results.   Long term current use of anticoagulant therapy Labs monitored by our clinical pharmacologist.     Orders Placed This Encounter  Procedures  . EKG 12-Lead   Meds ordered this encounter  Medications  . furosemide (LASIX) 20 MG tablet    Sig: Take 1 tablet by mouth daily.    Refill:  0    Followup: 1 year  Patrick [name], Leonie Green, M.D., M.S. Interventional Cardiologist   Pager # [phone]

## 2014-05-07 NOTE — Assessment & Plan Note (Signed)
On statin. Monitored by PCP. I have not seen any recent results.

## 2014-05-07 NOTE — Assessment & Plan Note (Signed)
Labs monitored by our clinical pharmacologist.

## 2014-05-07 NOTE — Assessment & Plan Note (Addendum)
Excellent blood pressure control today on beta blocker and calcium channel blocker. He is also on an ACE inhibitor/HCTZ.  today he is borderline hypotensive. If his pressures continue to be low, would consider decreasing the lisinopril/HCTZ dose in half. Thankfully he denies any orthostatic hypotension symptoms.

## 2014-05-07 NOTE — Assessment & Plan Note (Signed)
He continues to be well rate controlled with beta blocker and calcium channel blocker. Anticoagulated with warfarin. He seems to be having a stable hemoglobin in the setting of his stomach cancer. This is being monitored closely by his oncologist. No recurrent stroke symptoms.

## 2014-05-27 ENCOUNTER — Other Ambulatory Visit: Payer: Self-pay | Admitting: Cardiology

## 2014-05-27 NOTE — Telephone Encounter (Signed)
Rx(s) sent to pharmacy electronically.  

## 2014-06-17 ENCOUNTER — Ambulatory Visit (HOSPITAL_BASED_OUTPATIENT_CLINIC_OR_DEPARTMENT_OTHER): Payer: Medicare Other | Admitting: Oncology

## 2014-06-17 ENCOUNTER — Telehealth: Payer: Self-pay | Admitting: Oncology

## 2014-06-17 ENCOUNTER — Ambulatory Visit: Payer: Medicare Other | Admitting: Pharmacist Clinician (PhC)/ Clinical Pharmacy Specialist

## 2014-06-17 ENCOUNTER — Other Ambulatory Visit (HOSPITAL_BASED_OUTPATIENT_CLINIC_OR_DEPARTMENT_OTHER): Payer: Medicare Other

## 2014-06-17 VITALS — BP 110/56 | HR 110 | Temp 97.6°F | Resp 18 | Ht 72.0 in | Wt 192.3 lb

## 2014-06-17 DIAGNOSIS — D509 Iron deficiency anemia, unspecified: Secondary | ICD-10-CM

## 2014-06-17 DIAGNOSIS — C165 Malignant neoplasm of lesser curvature of stomach, unspecified: Secondary | ICD-10-CM

## 2014-06-17 DIAGNOSIS — C163 Malignant neoplasm of pyloric antrum: Secondary | ICD-10-CM

## 2014-06-17 LAB — COMPREHENSIVE METABOLIC PANEL (CC13)
ALT: 11 U/L (ref 0–55)
AST: 17 U/L (ref 5–34)
Albumin: 3.5 g/dL (ref 3.5–5.0)
Alkaline Phosphatase: 118 U/L (ref 40–150)
Anion Gap: 10 mEq/L (ref 3–11)
BUN: 26.1 mg/dL — ABNORMAL HIGH (ref 7.0–26.0)
CO2: 24 mEq/L (ref 22–29)
Calcium: 8.9 mg/dL (ref 8.4–10.4)
Chloride: 109 mEq/L (ref 98–109)
Creatinine: 1.2 mg/dL (ref 0.7–1.3)
EGFR: 68 mL/min/{1.73_m2} — ABNORMAL LOW (ref >90)
Glucose: 85 mg/dl (ref 70–140)
POTASSIUM: 4.5 meq/L (ref 3.5–5.1)
Sodium: 142 mEq/L (ref 136–145)
Total Bilirubin: 0.34 mg/dL (ref 0.20–1.20)
Total Protein: 7.2 g/dL (ref 6.4–8.3)

## 2014-06-17 LAB — CBC WITH DIFFERENTIAL/PLATELET
BASO%: 1.1 % (ref 0.0–2.0)
Basophils Absolute: 0.1 10*3/uL (ref 0.0–0.1)
EOS ABS: 0.1 10*3/uL (ref 0.0–0.5)
EOS%: 2 % (ref 0.0–7.0)
HCT: 25.9 % — ABNORMAL LOW (ref 38.4–49.9)
HEMOGLOBIN: 8.2 g/dL — AB (ref 13.0–17.1)
LYMPH%: 16 % (ref 14.0–49.0)
MCH: 24.3 pg — ABNORMAL LOW (ref 27.2–33.4)
MCHC: 31.7 g/dL — ABNORMAL LOW (ref 32.0–36.0)
MCV: 76.6 fL — AB (ref 79.3–98.0)
MONO#: 0.5 10*3/uL (ref 0.1–0.9)
MONO%: 9.8 % (ref 0.0–14.0)
NEUT%: 71.1 % (ref 39.0–75.0)
NEUTROS ABS: 3.3 10*3/uL (ref 1.5–6.5)
Platelets: 337 10*3/uL (ref 140–400)
RBC: 3.38 10*6/uL — AB (ref 4.20–5.82)
RDW: 15.9 % — AB (ref 11.0–14.6)
WBC: 4.6 10*3/uL (ref 4.0–10.3)
lymph#: 0.7 10*3/uL — ABNORMAL LOW (ref 0.9–3.3)

## 2014-06-17 NOTE — Telephone Encounter (Signed)
gave and printed appt sched and avs fo rpt for JULY and April

## 2014-06-17 NOTE — Progress Notes (Signed)
Hematology and Oncology Follow Up Visit  Patrick [name] [id number] [birth year] 69 y.o. 06/17/2014 12:50 PM Thressa Sheller, MDMackenzie, [name], MD   Principle Diagnosis: 69 year old gentleman with invasive adenocarcinoma of the stomach. He presented with a large elsewhere and iron deficiency anemia related to that in August of 2015. He is status post an endoscopy and a biopsy on 10/21/2013 confirmed the diagnosis. He presented with a hemoglobin of 7.9, MCV 68.8  Prior Therapy:  Status post IV iron 1 g given on 11/04/2013. He is status post definitive radiation therapy between 12/03/2013 and 12/30/2013. He received 40 Gray in 20 fractions.  Current therapy: observation and surveillance  Interim History: Mr. Patrick [name] presents today for a followup visit by him self. Since his last visit, he reports no new complaints. He does not report any nausea, vomiting or abdominal pain. He is no longer reporting any hematochezia or melena. He reports normal activity level and he has continued to drive at this time but mostly uses public transportatio. He does not report any hemoptysis or hematemesis. He is reported headaches or blurred vision or syncope. Does not report any fevers, chills, sweats or weight loss. Does not report any chest pain, orthopnea, palpitations or PND. Does report any cough or wheezing or shortness of breath. He has not reported any urinary symptoms of frequency urgency or hesitancy. He does not report any skeletal complaints of arthralgias or myalgias. He has not reported any lymphadenopathy or petechiae. He reports no skin rashes or lesions. His performance status still limited but have not declined. Rest of her review of systems unremarkable.      Medications: I have reviewed the patient's current medications.  Current Outpatient Prescriptions  Medication Sig Dispense Refill  . aspirin 81 MG EC tablet Take 81 mg by mouth daily.      Marland Kitchen atorvastatin (LIPITOR) 10 MG tablet Take 10 mg by  mouth daily.      . carvedilol (COREG) 6.25 MG tablet Take 1 tablet (6.25 mg total) by mouth 2 (two) times daily with a meal. 180 tablet 2  . DILT-XR 180 MG 24 hr capsule take 1 capsule by mouth once daily 30 capsule 11  . furosemide (LASIX) 20 MG tablet Take 1 tablet by mouth daily.  0  . glipiZIDE (GLUCOTROL XL) 5 MG 24 hr tablet Take 5 mg by mouth 2 (two) times daily.    Marland Kitchen lisinopril-hydrochlorothiazide (PRINZIDE,ZESTORETIC) 20-12.5 MG per tablet Take 1 tablet by mouth daily.    Marland Kitchen loperamide (IMODIUM A-D) 2 MG tablet Take 2 mg by mouth 4 (four) times daily as needed for diarrhea or loose stools.    Marland Kitchen METFORMIN HCL PO Take 1,000 mg by mouth 2 (two) times daily.     . pioglitazone (ACTOS) 15 MG tablet Take 15 mg by mouth daily.     Marland Kitchen PROAIR HFA 108 (90 BASE) MCG/ACT inhaler Inhale 2 puffs into the lungs 4 (four) times daily as needed.    . prochlorperazine (COMPAZINE) 10 MG tablet Take 1 tablet (10 mg total) by mouth every 6 (six) hours as needed for nausea or vomiting. 30 tablet 0  . warfarin (COUMADIN) 5 MG tablet Take 1 tablet by mouth daily or as directed by coumadin clinic 90 tablet 1   No current facility-administered medications for this visit.     Allergies: No Known Allergies  Past Medical History, Surgical history, Social history, and Family History were reviewed and updated.   Physical Exam: Blood pressure 110/56, pulse 110, temperature 97.6 F (36.4  C), temperature source Oral, resp. rate 18, height 6' (1.829 m), weight 192 lb 4.8 oz (87.227 kg), SpO2 100 %. ECOG: 1 General appearance: alert and cooperative. NAD Head: Normocephalic, without obvious abnormality Neck: no adenopathy Lymph nodes: Cervical, supraclavicular, and axillary nodes normal. Heart: Irregular slightly tachycardic. Lung:chest clear, no wheezing, rales, normal symmetric air entry Abdomin: soft, non-tender, without masses or organomegaly EXT:no erythema, induration, or nodules Neurological: No deficits.    Lab Results: Lab Results  Component Value Date   WBC 4.6 06/17/2014   HGB 8.2* 06/17/2014   HCT 25.9* 06/17/2014   MCV 76.6* 06/17/2014   PLT 337 06/17/2014     Chemistry      Component Value Date/Time   NA 142 06/17/2014 1139   NA 144 12/30/2009 2034   K 4.5 06/17/2014 1139   K 4.8 12/30/2009 2034   CL 106 12/30/2009 2034   CO2 24 06/17/2014 1139   CO2 26 12/30/2009 2034   BUN 26.1* 06/17/2014 1139   BUN 11 12/30/2009 2034   CREATININE 1.2 06/17/2014 1139   CREATININE 1.15 12/30/2009 2034      Component Value Date/Time   CALCIUM 8.9 06/17/2014 1139   CALCIUM 9.4 12/30/2009 2034   ALKPHOS 118 06/17/2014 1139   ALKPHOS 60 12/30/2009 2034   AST 17 06/17/2014 1139   AST 12 12/30/2009 2034   ALT 11 06/17/2014 1139   ALT <8 U/L 12/30/2009 2034   BILITOT 0.34 06/17/2014 1139   BILITOT 0.3 12/30/2009 2034      Impression and Plan:  69 year old gentleman with the following issues:  1. Gastric adenocarcinoma presented with an ulcer in the lesser curvature of the stomach that is biopsy proven. He is status post radiation therapy completed successfully. PET scan on 03/17/2014 did not show any clear-cut bulky metastasis. He continues to have small gastrohepatic lymph node with some activity that could suggest metastatic disease.  The plan is to repeat imaging studies in 3 months and institute systemic therapy he has evidence of systemic disease.  2. Iron deficiency anemia: He is status post IV iron in September 2015. His hemoglobin has drifted again with low MCV. I discussed the risks and benefits of repeat IV iron treatment and is agreeable to have that done. We will schedule him in the near future.  3. Follow-up: Will be in 3 months to check on his clinical status and repeat imaging studies.   Zola Button, MD 4/13/201612:50 PM

## 2014-06-17 NOTE — Telephone Encounter (Signed)
gave and printed appt sched and avs fo rpt for April and JULY....sed added tx.

## 2014-06-18 ENCOUNTER — Ambulatory Visit (INDEPENDENT_AMBULATORY_CARE_PROVIDER_SITE_OTHER): Payer: Medicare Other | Admitting: Pharmacist Clinician (PhC)/ Clinical Pharmacy Specialist

## 2014-06-18 DIAGNOSIS — Z7901 Long term (current) use of anticoagulants: Secondary | ICD-10-CM | POA: Diagnosis not present

## 2014-06-18 DIAGNOSIS — I4891 Unspecified atrial fibrillation: Secondary | ICD-10-CM | POA: Diagnosis not present

## 2014-06-18 DIAGNOSIS — I10 Essential (primary) hypertension: Secondary | ICD-10-CM | POA: Diagnosis not present

## 2014-06-18 DIAGNOSIS — E1121 Type 2 diabetes mellitus with diabetic nephropathy: Secondary | ICD-10-CM | POA: Diagnosis not present

## 2014-06-18 DIAGNOSIS — I4819 Other persistent atrial fibrillation: Secondary | ICD-10-CM

## 2014-06-18 DIAGNOSIS — I639 Cerebral infarction, unspecified: Secondary | ICD-10-CM

## 2014-06-18 DIAGNOSIS — I481 Persistent atrial fibrillation: Secondary | ICD-10-CM | POA: Diagnosis not present

## 2014-06-18 LAB — POCT INR: INR: 2.6

## 2014-06-19 ENCOUNTER — Ambulatory Visit (HOSPITAL_BASED_OUTPATIENT_CLINIC_OR_DEPARTMENT_OTHER): Payer: Medicare Other

## 2014-06-19 VITALS — BP 109/60 | HR 66 | Temp 98.4°F | Resp 18

## 2014-06-19 DIAGNOSIS — D509 Iron deficiency anemia, unspecified: Secondary | ICD-10-CM

## 2014-06-19 MED ORDER — SODIUM CHLORIDE 0.9 % IV SOLN
510.0000 mg | Freq: Once | INTRAVENOUS | Status: AC
  Administered 2014-06-19: 510 mg via INTRAVENOUS
  Filled 2014-06-19: qty 17

## 2014-06-19 MED ORDER — SODIUM CHLORIDE 0.9 % IV SOLN
Freq: Once | INTRAVENOUS | Status: AC
  Administered 2014-06-19: 13:00:00 via INTRAVENOUS

## 2014-06-19 NOTE — Patient Instructions (Signed)

## 2014-06-25 DIAGNOSIS — E785 Hyperlipidemia, unspecified: Secondary | ICD-10-CM | POA: Diagnosis not present

## 2014-06-25 DIAGNOSIS — N182 Chronic kidney disease, stage 2 (mild): Secondary | ICD-10-CM | POA: Diagnosis not present

## 2014-06-25 DIAGNOSIS — I10 Essential (primary) hypertension: Secondary | ICD-10-CM | POA: Diagnosis not present

## 2014-06-25 DIAGNOSIS — E1122 Type 2 diabetes mellitus with diabetic chronic kidney disease: Secondary | ICD-10-CM | POA: Diagnosis not present

## 2014-06-26 ENCOUNTER — Other Ambulatory Visit: Payer: Self-pay | Admitting: Medical Oncology

## 2014-06-26 ENCOUNTER — Ambulatory Visit (HOSPITAL_BASED_OUTPATIENT_CLINIC_OR_DEPARTMENT_OTHER): Payer: Medicare Other

## 2014-06-26 VITALS — BP 112/81 | HR 75 | Temp 97.8°F | Resp 18

## 2014-06-26 DIAGNOSIS — D509 Iron deficiency anemia, unspecified: Secondary | ICD-10-CM | POA: Diagnosis not present

## 2014-06-26 MED ORDER — SODIUM CHLORIDE 0.9 % IV SOLN
510.0000 mg | Freq: Once | INTRAVENOUS | Status: AC
  Administered 2014-06-26: 510 mg via INTRAVENOUS
  Filled 2014-06-26: qty 17

## 2014-06-26 MED ORDER — SODIUM CHLORIDE 0.9 % IV SOLN
Freq: Once | INTRAVENOUS | Status: AC
  Administered 2014-06-26: 14:00:00 via INTRAVENOUS

## 2014-06-26 NOTE — Patient Instructions (Signed)

## 2014-06-29 ENCOUNTER — Ambulatory Visit (INDEPENDENT_AMBULATORY_CARE_PROVIDER_SITE_OTHER): Payer: Medicare Other | Admitting: Podiatry

## 2014-06-29 DIAGNOSIS — B351 Tinea unguium: Secondary | ICD-10-CM

## 2014-06-29 DIAGNOSIS — M79673 Pain in unspecified foot: Secondary | ICD-10-CM

## 2014-06-30 NOTE — Progress Notes (Signed)
Subjective:     Patient ID: Patrick [name], male   DOB: April 01, 1945, 69 y.o.   MRN: [id number]  HPI patient presents with nail disease 1-5 both feet that are thick yellow brittle and painful when pressed   Review of Systems     Objective:   Physical Exam Neurovascular status intact with mycotic nail infection 1-5 both feet that are painful    Assessment:     Mycotic nail infection 1-5 both feet with pain    Plan:     Debride painful nailbeds 1-5 both feet with no iatrogenic bleeding noted

## 2014-07-16 ENCOUNTER — Ambulatory Visit (INDEPENDENT_AMBULATORY_CARE_PROVIDER_SITE_OTHER): Payer: Medicare Other | Admitting: Pharmacist Clinician (PhC)/ Clinical Pharmacy Specialist

## 2014-07-16 DIAGNOSIS — Z7901 Long term (current) use of anticoagulants: Secondary | ICD-10-CM | POA: Diagnosis not present

## 2014-07-16 DIAGNOSIS — I4891 Unspecified atrial fibrillation: Secondary | ICD-10-CM

## 2014-07-16 DIAGNOSIS — I481 Persistent atrial fibrillation: Secondary | ICD-10-CM | POA: Diagnosis not present

## 2014-07-16 DIAGNOSIS — I639 Cerebral infarction, unspecified: Secondary | ICD-10-CM | POA: Diagnosis not present

## 2014-07-16 DIAGNOSIS — I4819 Other persistent atrial fibrillation: Secondary | ICD-10-CM

## 2014-07-16 LAB — POCT INR: INR: 2.3

## 2014-07-31 ENCOUNTER — Telehealth: Payer: Self-pay | Admitting: Oncology

## 2014-07-31 NOTE — Telephone Encounter (Signed)
Called and left a message with new time for dr Alen Blew

## 2014-08-14 ENCOUNTER — Ambulatory Visit (INDEPENDENT_AMBULATORY_CARE_PROVIDER_SITE_OTHER): Payer: Medicare Other | Admitting: Pharmacist Clinician (PhC)/ Clinical Pharmacy Specialist

## 2014-08-14 DIAGNOSIS — I481 Persistent atrial fibrillation: Secondary | ICD-10-CM | POA: Diagnosis not present

## 2014-08-14 DIAGNOSIS — Z7901 Long term (current) use of anticoagulants: Secondary | ICD-10-CM | POA: Diagnosis not present

## 2014-08-14 DIAGNOSIS — I4819 Other persistent atrial fibrillation: Secondary | ICD-10-CM

## 2014-08-14 DIAGNOSIS — I639 Cerebral infarction, unspecified: Secondary | ICD-10-CM

## 2014-08-14 DIAGNOSIS — I4891 Unspecified atrial fibrillation: Secondary | ICD-10-CM

## 2014-08-14 LAB — POCT INR: INR: 1.9

## 2014-09-11 ENCOUNTER — Ambulatory Visit (INDEPENDENT_AMBULATORY_CARE_PROVIDER_SITE_OTHER): Payer: Medicare Other | Admitting: Pharmacist Clinician (PhC)/ Clinical Pharmacy Specialist

## 2014-09-11 DIAGNOSIS — I639 Cerebral infarction, unspecified: Secondary | ICD-10-CM | POA: Diagnosis not present

## 2014-09-11 DIAGNOSIS — Z7901 Long term (current) use of anticoagulants: Secondary | ICD-10-CM

## 2014-09-11 DIAGNOSIS — I4819 Other persistent atrial fibrillation: Secondary | ICD-10-CM

## 2014-09-11 DIAGNOSIS — I4891 Unspecified atrial fibrillation: Secondary | ICD-10-CM

## 2014-09-11 DIAGNOSIS — I481 Persistent atrial fibrillation: Secondary | ICD-10-CM

## 2014-09-11 LAB — POCT INR: INR: 2.7

## 2014-09-16 ENCOUNTER — Other Ambulatory Visit (HOSPITAL_BASED_OUTPATIENT_CLINIC_OR_DEPARTMENT_OTHER): Payer: Medicare Other

## 2014-09-16 ENCOUNTER — Ambulatory Visit (HOSPITAL_COMMUNITY)
Admission: RE | Admit: 2014-09-16 | Discharge: 2014-09-16 | Disposition: A | Discharge status: Home / Self Care | Payer: Medicare Other | Source: Ambulatory Visit | Attending: Oncology | Admitting: Oncology

## 2014-09-16 DIAGNOSIS — C169 Malignant neoplasm of stomach, unspecified: Secondary | ICD-10-CM | POA: Insufficient documentation

## 2014-09-16 DIAGNOSIS — C163 Malignant neoplasm of pyloric antrum: Secondary | ICD-10-CM

## 2014-09-16 DIAGNOSIS — C772 Secondary and unspecified malignant neoplasm of intra-abdominal lymph nodes: Secondary | ICD-10-CM | POA: Diagnosis not present

## 2014-09-16 DIAGNOSIS — D509 Iron deficiency anemia, unspecified: Secondary | ICD-10-CM

## 2014-09-16 LAB — COMPREHENSIVE METABOLIC PANEL (CC13)
ALK PHOS: 91 U/L (ref 40–150)
ALT: 11 U/L (ref 0–55)
AST: 17 U/L (ref 5–34)
Albumin: 3.6 g/dL (ref 3.5–5.0)
Anion Gap: 8 mEq/L (ref 3–11)
BUN: 29.7 mg/dL — ABNORMAL HIGH (ref 7.0–26.0)
CHLORIDE: 110 meq/L — AB (ref 98–109)
CO2: 23 mEq/L (ref 22–29)
Calcium: 9.2 mg/dL (ref 8.4–10.4)
Creatinine: 1 mg/dL (ref 0.7–1.3)
EGFR: 83 mL/min/{1.73_m2} — ABNORMAL LOW (ref >90)
GLUCOSE: 78 mg/dL (ref 70–140)
POTASSIUM: 4.4 meq/L (ref 3.5–5.1)
Sodium: 141 mEq/L (ref 136–145)
Total Bilirubin: <0.2 mg/dL (ref 0.20–1.20)
Total Protein: 7 g/dL (ref 6.4–8.3)

## 2014-09-16 LAB — CBC WITH DIFFERENTIAL/PLATELET
BASO%: 1.2 % (ref 0.0–2.0)
Basophils Absolute: 0.1 10*3/uL (ref 0.0–0.1)
EOS ABS: 0.1 10*3/uL (ref 0.0–0.5)
EOS%: 2.7 % (ref 0.0–7.0)
HCT: 23.4 % — ABNORMAL LOW (ref 38.4–49.9)
HGB: 7.4 g/dL — ABNORMAL LOW (ref 13.0–17.1)
LYMPH#: 0.8 10*3/uL — AB (ref 0.9–3.3)
LYMPH%: 15.1 % (ref 14.0–49.0)
MCH: 23.8 pg — ABNORMAL LOW (ref 27.2–33.4)
MCHC: 31.6 g/dL — AB (ref 32.0–36.0)
MCV: 75.3 fL — ABNORMAL LOW (ref 79.3–98.0)
MONO#: 0.5 10*3/uL (ref 0.1–0.9)
MONO%: 8.7 % (ref 0.0–14.0)
NEUT#: 3.9 10*3/uL (ref 1.5–6.5)
NEUT%: 72.3 % (ref 39.0–75.0)
PLATELETS: 366 10*3/uL (ref 140–400)
RBC: 3.11 10*6/uL — ABNORMAL LOW (ref 4.20–5.82)
RDW: 18.5 % — ABNORMAL HIGH (ref 11.0–14.6)
WBC: 5.3 10*3/uL (ref 4.0–10.3)

## 2014-09-16 LAB — IRON AND TIBC CHCC
%SAT: 10 % — ABNORMAL LOW (ref 20–55)
Iron: 33 ug/dL — ABNORMAL LOW (ref 42–163)
TIBC: 343 ug/dL (ref 202–409)
UIBC: 309 ug/dL (ref 117–376)

## 2014-09-16 LAB — FERRITIN CHCC: FERRITIN: 14 ng/mL — AB (ref 22–316)

## 2014-09-16 LAB — GLUCOSE, CAPILLARY: Glucose-Capillary: 73 mg/dL (ref 65–99)

## 2014-09-16 MED ORDER — FLUDEOXYGLUCOSE F - 18 (FDG) INJECTION
9.2600 | Freq: Once | INTRAVENOUS | Status: AC | PRN
  Administered 2014-09-16: 9.26 via INTRAVENOUS

## 2014-09-17 DIAGNOSIS — I10 Essential (primary) hypertension: Secondary | ICD-10-CM | POA: Diagnosis not present

## 2014-09-17 DIAGNOSIS — D509 Iron deficiency anemia, unspecified: Secondary | ICD-10-CM | POA: Diagnosis not present

## 2014-09-17 DIAGNOSIS — Z125 Encounter for screening for malignant neoplasm of prostate: Secondary | ICD-10-CM | POA: Diagnosis not present

## 2014-09-17 DIAGNOSIS — E1122 Type 2 diabetes mellitus with diabetic chronic kidney disease: Secondary | ICD-10-CM | POA: Diagnosis not present

## 2014-09-18 ENCOUNTER — Ambulatory Visit: Payer: Medicare Other | Admitting: Oncology

## 2014-09-24 DIAGNOSIS — J449 Chronic obstructive pulmonary disease, unspecified: Secondary | ICD-10-CM | POA: Diagnosis not present

## 2014-09-24 DIAGNOSIS — E1122 Type 2 diabetes mellitus with diabetic chronic kidney disease: Secondary | ICD-10-CM | POA: Diagnosis not present

## 2014-09-24 DIAGNOSIS — F17211 Nicotine dependence, cigarettes, in remission: Secondary | ICD-10-CM | POA: Diagnosis not present

## 2014-09-24 DIAGNOSIS — I69334 Monoplegia of upper limb following cerebral infarction affecting left non-dominant side: Secondary | ICD-10-CM | POA: Diagnosis not present

## 2014-09-24 DIAGNOSIS — F1021 Alcohol dependence, in remission: Secondary | ICD-10-CM | POA: Diagnosis not present

## 2014-09-29 ENCOUNTER — Ambulatory Visit (INDEPENDENT_AMBULATORY_CARE_PROVIDER_SITE_OTHER): Payer: Medicare Other | Admitting: Podiatry

## 2014-09-29 ENCOUNTER — Encounter: Payer: Self-pay | Admitting: Podiatry

## 2014-09-29 DIAGNOSIS — B351 Tinea unguium: Secondary | ICD-10-CM

## 2014-09-29 DIAGNOSIS — M79673 Pain in unspecified foot: Secondary | ICD-10-CM | POA: Diagnosis not present

## 2014-09-29 DIAGNOSIS — E1151 Type 2 diabetes mellitus with diabetic peripheral angiopathy without gangrene: Secondary | ICD-10-CM

## 2014-09-29 NOTE — Progress Notes (Signed)
Patient ID: Patrick [name], male   DOB: 1945-08-25, 69 y.o.   MRN: [id number] HPI  Complaint:  Visit Type: Patient returns to my office for continued preventative foot care services. Complaint: Patient states" my nails have grown long and thick and become painful to walk and wear shoes" Patient has been diagnosed with DM . This patient  presents for preventative foot care services. No changes to ROS  Podiatric Exam: Vascular: dorsalis pedis and posterior tibial pulses are negative. Capillary return is slow to refill. . Skin turgor WNL, Cold feet Sensorium: Normal Semmes Weinstein monofilament test. Normal tactile sensation bilaterally.  Nail Exam: Pt has thick disfigured discolored nails with subungual debris noted bilateral entire nail hallux through fifth toenails Ulcer Exam: There is no evidence of ulcer or pre-ulcerative changes or infection. Orthopedic Exam: Muscle tone and strength are WNL. No limitations in general ROM. No crepitus or effusions noted. Foot type and digits show no abnormalities. Bony prominences are unremarkable. Skin: No Porokeratosis. No infection or ulcers  Diagnosis:  Onychomycosis, Pain in right toe, pain in left toes  Treatment & Plan Procedures and Treatment: Consent by patient was obtained for treatment procedures. The patient understood the discussion of treatment and procedures well. All questions were answered thoroughly reviewed. Debridement of mycotic and hypertrophic toenails, 1 through 5 bilateral and clearing of subungual debris. No ulceration, no infection noted.  Return Visit-Office Procedure: Patient instructed to return to the office for a follow up visit 3 months for continued evaluation and treatment.

## 2014-10-01 ENCOUNTER — Telehealth: Payer: Self-pay | Admitting: *Deleted

## 2014-10-01 ENCOUNTER — Telehealth: Payer: Self-pay | Admitting: Oncology

## 2014-10-01 ENCOUNTER — Ambulatory Visit (HOSPITAL_BASED_OUTPATIENT_CLINIC_OR_DEPARTMENT_OTHER): Payer: Medicare Other | Admitting: Oncology

## 2014-10-01 VITALS — BP 105/69 | HR 81 | Temp 97.7°F | Resp 18 | Ht 72.0 in | Wt 184.6 lb

## 2014-10-01 DIAGNOSIS — C163 Malignant neoplasm of pyloric antrum: Secondary | ICD-10-CM

## 2014-10-01 DIAGNOSIS — D509 Iron deficiency anemia, unspecified: Secondary | ICD-10-CM

## 2014-10-01 MED ORDER — CAPECITABINE 500 MG PO TABS: ORAL_TABLET | ORAL | Status: DC

## 2014-10-01 NOTE — Progress Notes (Signed)
Hematology and Oncology Follow Up Visit  Patrick [name] [id number] 01/05/1946 69 y.o. 10/01/2014 1:58 PM Thressa Sheller, MDMackenzie, Patrick Edelman, MD   Principle Diagnosis: 69 year old gentleman with invasive adenocarcinoma of the stomach. He presented with a large elsewhere and iron deficiency anemia related to that in August of 2015. He is status post an endoscopy and a biopsy on 10/21/2013 confirmed the diagnosis. He presented with a hemoglobin of 7.9, MCV 68.8  Prior Therapy:  Status post IV iron 1 g given on 11/04/2013. He is status post definitive radiation therapy between 12/03/2013 and 12/30/2013. He received 40 Gray in 20 fractions.  Current therapy: observation and surveillance  Interim History: Patrick [name] presents today for a followup visit by himself. Since his last visit, he is reporting increased fatigue and tiredness. He has reported some dyspnea on exertion as well as decline in his exercise tolerance. Despite his still able to ambulate short distances and lives independently although his son stays with him periodically. His performance status is quite limited however. He does not report any nausea, vomiting or abdominal pain. He has not reported any hematochezia or melena. He does not report any hemoptysis or hematemesis. He is reported headaches or blurred vision or syncope. Does not report any fevers, chills, sweats or weight loss. Does not report any chest pain, orthopnea, palpitations or PND. Does report any cough or wheezing or shortness of breath. He has not reported any urinary symptoms of frequency urgency or hesitancy. He does not report any skeletal complaints of arthralgias or myalgias. He has not reported any lymphadenopathy or petechiae. He reports no skin rashes or lesions. His performance status still limited but have not declined. Rest of her review of systems unremarkable.      Medications: I have reviewed the patient's current medications.  Current Outpatient  Prescriptions  Medication Sig Dispense Refill  . aspirin 81 MG EC tablet Take 81 mg by mouth daily.      Marland Kitchen atorvastatin (LIPITOR) 10 MG tablet Take 10 mg by mouth daily.      . carvedilol (COREG) 6.25 MG tablet Take 1 tablet (6.25 mg total) by mouth 2 (two) times daily with a meal. 180 tablet 2  . DILT-XR 180 MG 24 hr capsule take 1 capsule by mouth once daily 30 capsule 11  . ferrous sulfate 325 (65 FE) MG tablet Take 325 mg by mouth 3 (three) times daily.  0  . furosemide (LASIX) 20 MG tablet Take 1 tablet by mouth daily.  0  . glipiZIDE (GLUCOTROL XL) 5 MG 24 hr tablet Take 5 mg by mouth 2 (two) times daily.    Marland Kitchen lisinopril-hydrochlorothiazide (PRINZIDE,ZESTORETIC) 20-12.5 MG per tablet Take 1 tablet by mouth daily.    Marland Kitchen loperamide (IMODIUM A-D) 2 MG tablet Take 2 mg by mouth 4 (four) times daily as needed for diarrhea or loose stools.    . metFORMIN (GLUCOPHAGE) 1000 MG tablet   0  . METFORMIN HCL PO Take 1,000 mg by mouth 2 (two) times daily.     Glory Rosebush VERIO test strip   0  . PROAIR HFA 108 (90 BASE) MCG/ACT inhaler Inhale 2 puffs into the lungs 4 (four) times daily as needed.    . prochlorperazine (COMPAZINE) 10 MG tablet Take 1 tablet (10 mg total) by mouth every 6 (six) hours as needed for nausea or vomiting. 30 tablet 0  . warfarin (COUMADIN) 5 MG tablet Take 1 tablet by mouth daily or as directed by coumadin clinic 90 tablet 1  .  capecitabine (XELODA) 500 MG tablet Take 2 tablets by mouth twice a day. 120 tablet 0   No current facility-administered medications for this visit.     Allergies: No Known Allergies  Past Medical History, Surgical history, Social history, and Family History were reviewed and updated.   Physical Exam: Blood pressure 105/69, pulse 81, temperature 97.7 F (36.5 C), temperature source Oral, resp. rate 18, height 6' (1.829 m), weight 184 lb 9.6 oz (83.734 kg), SpO2 100 %. ECOG: 1 General appearance: alert and cooperative. NAD Head: Normocephalic,  without obvious abnormality Neck: no adenopathy Lymph nodes: Cervical, supraclavicular, and axillary nodes normal. Heart: Irregular slightly tachycardic. Lung:chest clear, no wheezing, rales, normal symmetric air entry Abdomin: soft, non-tender, without masses or organomegaly EXT:no erythema, induration, or nodules Neurological: No deficits.   Lab Results: Lab Results  Component Value Date   WBC 5.3 09/16/2014   HGB 7.4* 09/16/2014   HCT 23.4* 09/16/2014   MCV 75.3* 09/16/2014   PLT 366 09/16/2014     Chemistry      Component Value Date/Time   NA 141 09/16/2014 0829   NA 144 12/30/2009 2034   K 4.4 09/16/2014 0829   K 4.8 12/30/2009 2034   CL 106 12/30/2009 2034   CO2 23 09/16/2014 0829   CO2 26 12/30/2009 2034   BUN 29.7* 09/16/2014 0829   BUN 11 12/30/2009 2034   CREATININE 1.0 09/16/2014 0829   CREATININE 1.15 12/30/2009 2034      Component Value Date/Time   CALCIUM 9.2 09/16/2014 0829   CALCIUM 9.4 12/30/2009 2034   ALKPHOS 91 09/16/2014 0829   ALKPHOS 60 12/30/2009 2034   AST 17 09/16/2014 0829   AST 12 12/30/2009 2034   ALT 11 09/16/2014 0829   ALT <8 U/L 12/30/2009 2034   BILITOT <0.20 09/16/2014 0829   BILITOT 0.3 12/30/2009 2034      EXAM: NUCLEAR MEDICINE PET SKULL BASE TO THIGH  TECHNIQUE: 9.26 mCi F-18 FDG was injected intravenously. Full-ring PET imaging was performed from the skull base to thigh after the radiotracer. CT data was obtained and used for attenuation correction and anatomic localization.  FASTING BLOOD GLUCOSE: Value: 73 mg/dl  COMPARISON: PET-CT dated 03/17/2014  FINDINGS: NECK  No hypermetabolic lymph nodes in the neck.  CHEST  Lungs are clear. No suspicious pulmonary nodules.  Mild cardiomegaly. No pericardial effusion. Coronary atherosclerosis. Mild atherosclerotic calcifications of the aortic arch.  Small mediastinal lymph nodes, including a 10 mm short axis subcarinal node (series 4/image 70). No  hypermetabolic thoracic lymphadenopathy.  ABDOMEN/PELVIS  Wall thickening along the distal greater curvature/gastric antrum (series 4/image 102), max SUV 13.1, corresponding to known primary gastric neoplasm. This appearance has progressed from the prior.  15 mm short axis gastrohepatic lymph node (series 4/image 99), previously 11 mm, max SUV 4.8.  No abnormal hypermetabolic activity within the liver, pancreas, adrenal glands, or spleen.  Atherosclerotic calcifications of the abdominal aorta and branch vessels.  Right pelvic kidney. No hydronephrosis.  SKELETON  No focal hypermetabolic activity to suggest skeletal metastasis.  IMPRESSION: Progression of known gastric neoplasm involving the gastric antrum/distal greater curvature.  Gastrohepatic nodal metastasis, mildly increased.  No evidence of distal metastases.    Impression and Plan:  69 year old gentleman with the following issues:  1. Gastric adenocarcinoma presented with an ulcer in the lesser curvature of the stomach that is biopsy proven. He is status post radiation therapy completed successfully.   PET scan on 03/17/2014 did not show any clear-cut bulky metastasis.  He continues to have small gastrohepatic lymph node with some activity that could suggest metastatic disease.  His repeat PET scan in July 2016 was reviewed today and showed more progression of disease. It appears to have a local recurrence as well as possible local lymphadenopathy. Options of treatment were discussed today including multiagent intravenous chemotherapy, oral Xeloda versus observation and surveillance. Risks and benefits of all these options were discussed at this time. I feel that he is a marginal candidate for multiagent intravenous chemotherapy utilizing platinum-based regimens. These regimens that included oxaliplatin or cisplatin with 5-FU and epirubicin a rather toxic and I do not think his limited performance status  can tolerate that. I that he did reasonably fair with Xeloda only given the low bulk of disease he has. Observation surveillance would be reasonable as well given the fact that he is asymptomatic.  After discussion today, he opted to proceed with oral Xeloda at this time. Complications from this medication were reviewed including nausea, fatigue, hand-foot syndrome, diarrhea but barely myelosuppression and mucositis. He is agreeable to proceed in the near future. In planning to start a lower dose of 2000 mg total dose daily and titrate up as needed to.    2. Iron deficiency anemia: He is status post IV iron in September 2015. His hemoglobin has drifted again with low MCV. I discussed the risks and benefits of repeat IV iron treatment and is agreeable to have that done. We will schedule him in the near future.  3. Follow-up: Will be in one month to follow-up on his clinical status.  Zola Button, MD 7/28/20161:58 PM

## 2014-10-01 NOTE — Telephone Encounter (Signed)
Per staff message and POF I have scheduled appts. Advised scheduler of appts. JMW  

## 2014-10-01 NOTE — Telephone Encounter (Signed)
Pt confirmed MD visit per 07/28 POF, gave pt AVS and Calendar.Cherylann Banas, sent msg to add IV Iron Infusion ... KJ

## 2014-10-01 NOTE — Progress Notes (Signed)
Prescription for Xeloda placed in Managed Cares box.

## 2014-10-01 NOTE — Telephone Encounter (Signed)
Lft msg for pt confirming IV Iron schedule per 07/28 POF.... Cherylann Banas

## 2014-10-02 ENCOUNTER — Encounter: Payer: Self-pay | Admitting: Oncology

## 2014-10-02 ENCOUNTER — Encounter: Payer: Self-pay | Admitting: *Deleted

## 2014-10-02 ENCOUNTER — Ambulatory Visit: Payer: Medicare Other

## 2014-10-02 NOTE — Progress Notes (Signed)
I faxed xeloda req to [facility] [phone]

## 2014-10-02 NOTE — Progress Notes (Signed)
Left message on patient's phone to call me re: xeloda dosing.

## 2014-10-02 NOTE — Progress Notes (Unsigned)
Spoke with patient. instructed him to take xeloda 2 tabs in am and 2 tabs in pm, daily x 2 weeks and off 1 week. Patient verbalized understanding. States he has not received the medication yet.

## 2014-10-02 NOTE — Addendum Note (Signed)
Addended by: Randolm Idol on: 10/02/2014 03:16 PM   Modules accepted: Medications

## 2014-10-05 ENCOUNTER — Telehealth: Payer: Self-pay | Admitting: *Deleted

## 2014-10-05 ENCOUNTER — Telehealth: Payer: Self-pay | Admitting: Oncology

## 2014-10-05 NOTE — Telephone Encounter (Signed)
I have rescheduled appt from 7/29 to 8/12

## 2014-10-05 NOTE — Telephone Encounter (Signed)
Pt called to state he missed apt 07/29 Infusion out of town, sent msg to r/s infusion, mailed out schedule to pt... KJ

## 2014-10-09 ENCOUNTER — Ambulatory Visit (HOSPITAL_BASED_OUTPATIENT_CLINIC_OR_DEPARTMENT_OTHER): Payer: Medicare Other

## 2014-10-09 VITALS — BP 111/52 | HR 73 | Temp 97.6°F | Resp 20

## 2014-10-09 DIAGNOSIS — D509 Iron deficiency anemia, unspecified: Secondary | ICD-10-CM | POA: Diagnosis not present

## 2014-10-09 MED ORDER — SODIUM CHLORIDE 0.9 % IV SOLN
510.0000 mg | Freq: Once | INTRAVENOUS | Status: AC
  Administered 2014-10-09: 510 mg via INTRAVENOUS
  Filled 2014-10-09: qty 17

## 2014-10-09 MED ORDER — SODIUM CHLORIDE 0.9 % IV SOLN
Freq: Once | INTRAVENOUS | Status: AC
  Administered 2014-10-09: 15:00:00 via INTRAVENOUS

## 2014-10-09 NOTE — Patient Instructions (Signed)

## 2014-10-12 ENCOUNTER — Encounter: Payer: Self-pay | Admitting: Oncology

## 2014-10-12 NOTE — Progress Notes (Signed)
Per P[facility] at [facility] was delivered 10/08/14 with zero copay

## 2014-10-16 ENCOUNTER — Ambulatory Visit (HOSPITAL_BASED_OUTPATIENT_CLINIC_OR_DEPARTMENT_OTHER): Payer: Medicare Other

## 2014-10-16 VITALS — BP 94/45 | HR 69 | Temp 97.8°F | Resp 19

## 2014-10-16 DIAGNOSIS — D509 Iron deficiency anemia, unspecified: Secondary | ICD-10-CM | POA: Diagnosis not present

## 2014-10-16 MED ORDER — SODIUM CHLORIDE 0.9 % IV SOLN
Freq: Once | INTRAVENOUS | Status: AC
  Administered 2014-10-16: 13:00:00 via INTRAVENOUS

## 2014-10-16 MED ORDER — SODIUM CHLORIDE 0.9 % IV SOLN
510.0000 mg | Freq: Once | INTRAVENOUS | Status: AC
  Administered 2014-10-16: 510 mg via INTRAVENOUS
  Filled 2014-10-16: qty 17

## 2014-10-16 NOTE — Patient Instructions (Signed)

## 2014-10-20 ENCOUNTER — Encounter: Payer: Self-pay | Admitting: *Deleted

## 2014-10-20 ENCOUNTER — Other Ambulatory Visit: Payer: Self-pay | Admitting: *Deleted

## 2014-10-20 DIAGNOSIS — C163 Malignant neoplasm of pyloric antrum: Secondary | ICD-10-CM

## 2014-10-20 MED ORDER — CAPECITABINE 500 MG PO TABS: ORAL_TABLET | ORAL | Status: DC

## 2014-10-23 ENCOUNTER — Ambulatory Visit (INDEPENDENT_AMBULATORY_CARE_PROVIDER_SITE_OTHER): Payer: Medicare Other | Admitting: Pharmacist Clinician (PhC)/ Clinical Pharmacy Specialist

## 2014-10-23 DIAGNOSIS — I481 Persistent atrial fibrillation: Secondary | ICD-10-CM | POA: Diagnosis not present

## 2014-10-23 DIAGNOSIS — Z7901 Long term (current) use of anticoagulants: Secondary | ICD-10-CM | POA: Diagnosis not present

## 2014-10-23 DIAGNOSIS — I4891 Unspecified atrial fibrillation: Secondary | ICD-10-CM | POA: Diagnosis not present

## 2014-10-23 DIAGNOSIS — I639 Cerebral infarction, unspecified: Secondary | ICD-10-CM | POA: Diagnosis not present

## 2014-10-23 DIAGNOSIS — I4819 Other persistent atrial fibrillation: Secondary | ICD-10-CM

## 2014-10-23 LAB — POCT INR: INR: 2.4

## 2014-10-29 ENCOUNTER — Other Ambulatory Visit (HOSPITAL_BASED_OUTPATIENT_CLINIC_OR_DEPARTMENT_OTHER): Payer: Medicare Other

## 2014-10-29 ENCOUNTER — Telehealth: Payer: Self-pay | Admitting: Oncology

## 2014-10-29 ENCOUNTER — Ambulatory Visit (HOSPITAL_BASED_OUTPATIENT_CLINIC_OR_DEPARTMENT_OTHER): Payer: Medicare Other | Admitting: Oncology

## 2014-10-29 VITALS — BP 102/56 | HR 66 | Temp 97.8°F | Resp 18 | Ht 72.0 in | Wt 182.9 lb

## 2014-10-29 DIAGNOSIS — D509 Iron deficiency anemia, unspecified: Secondary | ICD-10-CM

## 2014-10-29 DIAGNOSIS — C163 Malignant neoplasm of pyloric antrum: Secondary | ICD-10-CM

## 2014-10-29 LAB — CBC WITH DIFFERENTIAL/PLATELET
BASO%: 2.3 % — AB (ref 0.0–2.0)
BASOS ABS: 0.1 10*3/uL (ref 0.0–0.1)
EOS%: 2.9 % (ref 0.0–7.0)
Eosinophils Absolute: 0.1 10*3/uL (ref 0.0–0.5)
HEMATOCRIT: 27.9 % — AB (ref 38.4–49.9)
HGB: 8.8 g/dL — ABNORMAL LOW (ref 13.0–17.1)
LYMPH#: 0.5 10*3/uL — AB (ref 0.9–3.3)
LYMPH%: 16.9 % (ref 14.0–49.0)
MCH: 26.2 pg — AB (ref 27.2–33.4)
MCHC: 31.6 g/dL — AB (ref 32.0–36.0)
MCV: 83 fL (ref 79.3–98.0)
MONO#: 0.3 10*3/uL (ref 0.1–0.9)
MONO%: 10.2 % (ref 0.0–14.0)
NEUT#: 2.1 10*3/uL (ref 1.5–6.5)
NEUT%: 67.7 % (ref 39.0–75.0)
PLATELETS: 331 10*3/uL (ref 140–400)
RBC: 3.36 10*6/uL — AB (ref 4.20–5.82)
RDW: 31.7 % — ABNORMAL HIGH (ref 11.0–14.6)
WBC: 3.1 10*3/uL — ABNORMAL LOW (ref 4.0–10.3)

## 2014-10-29 LAB — COMPREHENSIVE METABOLIC PANEL (CC13)
ALT: 20 U/L (ref 0–55)
ANION GAP: 8 meq/L (ref 3–11)
AST: 20 U/L (ref 5–34)
Albumin: 3.8 g/dL (ref 3.5–5.0)
Alkaline Phosphatase: 99 U/L (ref 40–150)
BUN: 18.7 mg/dL (ref 7.0–26.0)
CALCIUM: 9 mg/dL (ref 8.4–10.4)
CHLORIDE: 109 meq/L (ref 98–109)
CO2: 24 meq/L (ref 22–29)
Creatinine: 1.4 mg/dL — ABNORMAL HIGH (ref 0.7–1.3)
EGFR: 61 mL/min/{1.73_m2} — AB (ref >90)
Glucose: 112 mg/dl (ref 70–140)
POTASSIUM: 4.4 meq/L (ref 3.5–5.1)
Sodium: 142 mEq/L (ref 136–145)
Total Bilirubin: 0.2 mg/dL (ref 0.20–1.20)
Total Protein: 7 g/dL (ref 6.4–8.3)

## 2014-10-29 NOTE — Telephone Encounter (Signed)
per pof to sch pt appt-gave pt copy of avs °

## 2014-10-29 NOTE — Progress Notes (Signed)
Hematology and Oncology Follow Up Visit  Patrick [name] [id number] April 06, 1945 69 y.o. 10/29/2014 2:30 PM Patrick [name], MDMackenzie, Patrick Edelman, MD   Principle Diagnosis: 69 year old gentleman with invasive adenocarcinoma of the stomach. He presented with a large elsewhere and iron deficiency anemia related to that in August of 2015. He is status post an endoscopy and a biopsy on 10/21/2013 confirmed the diagnosis. He presented with a hemoglobin of 7.9, MCV 68.8  Prior Therapy:  Status post IV iron 1 g given on 11/04/2013. This was repeated in August 2016. He is status post definitive radiation therapy between 12/03/2013 and 12/30/2013. He received 40 Gray in 20 fractions.  Current therapy: Xeloda at a dose of 1000 mg twice a day 2 weeks on 1 week off started in July 2016. He has completed 1 cycle of this time.  Interim History: Patrick [name] presents today for a followup visit by himself. Since his last visit, he started Xeloda and reports no complications. He does not report any nausea, vomiting or abdominal pain. He does not report any hand-foot syndrome. He does not report any oral ulcers or lesions. Does not report any diarrhea.   He received IV iron last week and tolerated it well without any complications. His energy starting to improve slowly. He is still able to ambulate short distances and lives independently.    He does not report any nausea, vomiting or abdominal pain. He has not reported any hematochezia or melena. He does not report any hemoptysis or hematemesis. He is reported headaches or blurred vision or syncope. Does not report any fevers, chills, sweats or weight loss. Does not report any chest pain, orthopnea, palpitations or PND. Does report any cough or wheezing or shortness of breath. He has not reported any urinary symptoms of frequency urgency or hesitancy. He does not report any skeletal complaints of arthralgias or myalgias. He has not reported any lymphadenopathy or  petechiae. He reports no skin rashes or lesions. His performance status still limited but have not declined. Rest of her review of systems unremarkable.      Medications: I have reviewed the patient's current medications.  Current Outpatient Prescriptions  Medication Sig Dispense Refill  . aspirin 81 MG EC tablet Take 81 mg by mouth daily.      Marland Kitchen atorvastatin (LIPITOR) 10 MG tablet Take 10 mg by mouth daily.      . capecitabine (XELODA) 500 MG tablet Take 2 tablets by mouth twice a day. 120 tablet 0  . carvedilol (COREG) 6.25 MG tablet Take 1 tablet (6.25 mg total) by mouth 2 (two) times daily with a meal. 180 tablet 2  . DILT-XR 180 MG 24 hr capsule take 1 capsule by mouth once daily 30 capsule 11  . ferrous sulfate 325 (65 FE) MG tablet Take 325 mg by mouth 3 (three) times daily.  0  . furosemide (LASIX) 20 MG tablet Take 1 tablet by mouth daily.  0  . glipiZIDE (GLUCOTROL XL) 5 MG 24 hr tablet Take 5 mg by mouth 2 (two) times daily.    Marland Kitchen lisinopril-hydrochlorothiazide (PRINZIDE,ZESTORETIC) 20-12.5 MG per tablet Take 1 tablet by mouth daily.    Marland Kitchen loperamide (IMODIUM A-D) 2 MG tablet Take 2 mg by mouth 4 (four) times daily as needed for diarrhea or loose stools.    . metFORMIN (GLUCOPHAGE) 1000 MG tablet   0  . METFORMIN HCL PO Take 1,000 mg by mouth 2 (two) times daily.     Glory Rosebush VERIO test strip  0  . PROAIR HFA 108 (90 BASE) MCG/ACT inhaler Inhale 2 puffs into the lungs 4 (four) times daily as needed.    . prochlorperazine (COMPAZINE) 10 MG tablet Take 1 tablet (10 mg total) by mouth every 6 (six) hours as needed for nausea or vomiting. 30 tablet 0  . warfarin (COUMADIN) 5 MG tablet Take 1 tablet by mouth daily or as directed by coumadin clinic 90 tablet 1   No current facility-administered medications for this visit.     Allergies: No Known Allergies  Past Medical History, Surgical history, Social history, and Family History were reviewed and updated.   Physical  Exam: Blood pressure 102/56, pulse 66, temperature 97.8 F (36.6 C), temperature source Oral, resp. rate 18, height 6' (1.829 m), weight 182 lb 14.4 oz (82.963 kg), SpO2 100 %. ECOG: 1 General appearance: alert and cooperative. Chronically ill-appearing gentleman without any distress. Head: Normocephalic, without obvious abnormality Neck: no adenopathy Lymph nodes: Cervical, supraclavicular, and axillary nodes normal. Heart: Irregular slightly tachycardic. Lung:chest clear, no wheezing, rales, normal symmetric air entry Abdomin: soft, non-tender, without masses or organomegaly EXT:no erythema, induration, or nodules Neurological: No deficits.   Lab Results: Lab Results  Component Value Date   WBC 3.1* 10/29/2014   HGB 8.8* 10/29/2014   HCT 27.9* 10/29/2014   MCV 83.0 10/29/2014   PLT 331 10/29/2014     Chemistry      Component Value Date/Time   NA 142 10/29/2014 1335   NA 144 12/30/2009 2034   K 4.4 10/29/2014 1335   K 4.8 12/30/2009 2034   CL 106 12/30/2009 2034   CO2 24 10/29/2014 1335   CO2 26 12/30/2009 2034   BUN 18.7 10/29/2014 1335   BUN 11 12/30/2009 2034   CREATININE 1.4* 10/29/2014 1335   CREATININE 1.15 12/30/2009 2034      Component Value Date/Time   CALCIUM 9.0 10/29/2014 1335   CALCIUM 9.4 12/30/2009 2034   ALKPHOS 99 10/29/2014 1335   ALKPHOS 60 12/30/2009 2034   AST 20 10/29/2014 1335   AST 12 12/30/2009 2034   ALT 20 10/29/2014 1335   ALT <8 U/L 12/30/2009 2034   BILITOT 0.20 10/29/2014 1335   BILITOT 0.3 12/30/2009 2034       Impression and Plan:  69 year old gentleman with the following issues:  1. Gastric adenocarcinoma presented with an ulcer in the lesser curvature of the stomach that is biopsy proven. He is status post radiation therapy completed successfully.   PET scan on 03/17/2014 did not show any clear-cut bulky metastasis. He continues to have small gastrohepatic lymph node with some activity that could suggest metastatic  disease.  His repeat PET scan in July 2016  showed more progression of disease with rather low volume disease. He is currently on oral Xeloda and have tolerated it well. I plan on continuing with the same dose and schedule and repeat imaging studies after he completes 3 cycles. If his response is inadequate, I will add either taxanes or platinum agent to Xeloda.   2. Iron deficiency anemia: He is status post IV iron in August 2016. His hemoglobin is improving slowly and we'll continue to monitor on a regular basis.  3. Follow-up: Will be in one month to follow-up on his clinical status.  Zola Button, MD 8/25/20162:30 PM

## 2014-11-11 ENCOUNTER — Encounter: Payer: Self-pay | Admitting: *Deleted

## 2014-11-11 ENCOUNTER — Telehealth: Payer: Self-pay | Admitting: *Deleted

## 2014-11-16 ENCOUNTER — Ambulatory Visit (INDEPENDENT_AMBULATORY_CARE_PROVIDER_SITE_OTHER): Payer: Medicare Other | Admitting: Pharmacist Clinician (PhC)/ Clinical Pharmacy Specialist

## 2014-11-16 DIAGNOSIS — I639 Cerebral infarction, unspecified: Secondary | ICD-10-CM

## 2014-11-16 DIAGNOSIS — Z7901 Long term (current) use of anticoagulants: Secondary | ICD-10-CM | POA: Diagnosis not present

## 2014-11-16 DIAGNOSIS — I4819 Other persistent atrial fibrillation: Secondary | ICD-10-CM

## 2014-11-16 DIAGNOSIS — I4891 Unspecified atrial fibrillation: Secondary | ICD-10-CM | POA: Diagnosis not present

## 2014-11-16 DIAGNOSIS — I481 Persistent atrial fibrillation: Secondary | ICD-10-CM

## 2014-11-16 LAB — POCT INR: INR: 2.7

## 2014-11-20 ENCOUNTER — Ambulatory Visit (HOSPITAL_BASED_OUTPATIENT_CLINIC_OR_DEPARTMENT_OTHER): Payer: Medicare Other | Admitting: Oncology

## 2014-11-20 ENCOUNTER — Other Ambulatory Visit (HOSPITAL_BASED_OUTPATIENT_CLINIC_OR_DEPARTMENT_OTHER): Payer: Medicare Other

## 2014-11-20 ENCOUNTER — Telehealth: Payer: Self-pay | Admitting: Oncology

## 2014-11-20 VITALS — BP 111/56 | HR 49 | Temp 97.9°F | Resp 16 | Ht 72.0 in | Wt 183.1 lb

## 2014-11-20 DIAGNOSIS — I639 Cerebral infarction, unspecified: Secondary | ICD-10-CM | POA: Diagnosis not present

## 2014-11-20 DIAGNOSIS — C163 Malignant neoplasm of pyloric antrum: Secondary | ICD-10-CM

## 2014-11-20 DIAGNOSIS — I4891 Unspecified atrial fibrillation: Secondary | ICD-10-CM | POA: Diagnosis not present

## 2014-11-20 DIAGNOSIS — Z7901 Long term (current) use of anticoagulants: Secondary | ICD-10-CM

## 2014-11-20 LAB — COMPREHENSIVE METABOLIC PANEL (CC13)
ALT: 15 U/L (ref 0–55)
ANION GAP: 9 meq/L (ref 3–11)
AST: 19 U/L (ref 5–34)
Albumin: 3.5 g/dL (ref 3.5–5.0)
Alkaline Phosphatase: 88 U/L (ref 40–150)
BUN: 19.9 mg/dL (ref 7.0–26.0)
CO2: 24 meq/L (ref 22–29)
CREATININE: 1.4 mg/dL — AB (ref 0.7–1.3)
Calcium: 9.2 mg/dL (ref 8.4–10.4)
Chloride: 107 mEq/L (ref 98–109)
EGFR: 60 mL/min/{1.73_m2} — ABNORMAL LOW (ref >90)
GLUCOSE: 185 mg/dL — AB (ref 70–140)
Potassium: 4.6 mEq/L (ref 3.5–5.1)
SODIUM: 140 meq/L (ref 136–145)
TOTAL PROTEIN: 6.7 g/dL (ref 6.4–8.3)

## 2014-11-20 LAB — CBC WITH DIFFERENTIAL/PLATELET
BASO%: 1.8 % (ref 0.0–2.0)
Basophils Absolute: 0.1 10*3/uL (ref 0.0–0.1)
EOS%: 2.3 % (ref 0.0–7.0)
Eosinophils Absolute: 0.1 10*3/uL (ref 0.0–0.5)
HCT: 29.9 % — ABNORMAL LOW (ref 38.4–49.9)
HGB: 9.5 g/dL — ABNORMAL LOW (ref 13.0–17.1)
LYMPH%: 16.7 % (ref 14.0–49.0)
MCH: 26.9 pg — ABNORMAL LOW (ref 27.2–33.4)
MCHC: 31.8 g/dL — ABNORMAL LOW (ref 32.0–36.0)
MCV: 84.7 fL (ref 79.3–98.0)
MONO#: 0.4 10*3/uL (ref 0.1–0.9)
MONO%: 9.9 % (ref 0.0–14.0)
NEUT%: 69.3 % (ref 39.0–75.0)
NEUTROS ABS: 2.6 10*3/uL (ref 1.5–6.5)
PLATELETS: 329 10*3/uL (ref 140–400)
RBC: 3.53 10*6/uL — AB (ref 4.20–5.82)
RDW: 29.9 % — ABNORMAL HIGH (ref 11.0–14.6)
WBC: 3.8 10*3/uL — AB (ref 4.0–10.3)
lymph#: 0.6 10*3/uL — ABNORMAL LOW (ref 0.9–3.3)

## 2014-11-20 NOTE — Telephone Encounter (Signed)
Pt confirmed labs/ov per 09/16 POF, gave pt AVS and Calendar... KJ °

## 2014-11-20 NOTE — Progress Notes (Signed)
Hematology and Oncology Follow Up Visit  Patrick [name] [id number] March 08, 1945 69 y.o. 11/20/2014 8:56 AM Thressa Sheller, MDMackenzie, [name], MD   Principle Diagnosis: 69 year old gentleman with invasive adenocarcinoma of the stomach. He presented with a large elsewhere and iron deficiency anemia related to that in August of 2015. He is status post an endoscopy and a biopsy on 10/21/2013 confirmed the diagnosis. He presented with a hemoglobin of 7.9, MCV 68.8  Prior Therapy:  Status post IV iron 1 g given on 11/04/2013. This was repeated in August 2016. He is status post definitive radiation therapy between 12/03/2013 and 12/30/2013. He received 40 Gray in 20 fractions.  Current therapy: Xeloda at a dose of 1000 mg twice a day 2 weeks on 1 week off started in July 2016. He has completed 2 cycle of this time.  Interim History: Patrick [name] presents today for a followup visit by himself. Since his last visit, he continues to do well without any new complaints. He have tolerated Xeloda without any side effects. He does not report any nausea, vomiting or abdominal pain. He does not report any hand-foot syndrome. He does not report any oral ulcers or lesions. Does not report any diarrhea.   He reports increase in his energy and performance status after the IV iron infusion. He is no longer reporting any dyspnea on exertion. Has not reported any hematochezia or bleeding per rectum. Her performance status and activity level remains intact and his quality of life remains reasonable.   He does not report any nausea, vomiting or abdominal pain. He has not reported any hematochezia or melena. He does not report any hemoptysis or hematemesis. He is reported headaches or blurred vision or syncope. Does not report any fevers, chills, sweats or weight loss. Does not report any chest pain, orthopnea, palpitations or PND. Does report any cough or wheezing or shortness of breath. He has not reported any urinary  symptoms of frequency urgency or hesitancy. He does not report any skeletal complaints of arthralgias or myalgias. He has not reported any lymphadenopathy or petechiae. He reports no skin rashes or lesions. His performance status still limited but have not declined. Rest of her review of systems unremarkable.      Medications: I have reviewed the patient's current medications.  Current Outpatient Prescriptions  Medication Sig Dispense Refill  . aspirin 81 MG EC tablet Take 81 mg by mouth daily.      Marland Kitchen atorvastatin (LIPITOR) 10 MG tablet Take 10 mg by mouth daily.      . capecitabine (XELODA) 500 MG tablet Take 2 tablets by mouth twice a day. 120 tablet 0  . carvedilol (COREG) 6.25 MG tablet Take 1 tablet (6.25 mg total) by mouth 2 (two) times daily with a meal. 180 tablet 2  . DILT-XR 180 MG 24 hr capsule take 1 capsule by mouth once daily 30 capsule 11  . ferrous sulfate 325 (65 FE) MG tablet Take 325 mg by mouth 3 (three) times daily.  0  . furosemide (LASIX) 20 MG tablet Take 1 tablet by mouth daily.  0  . glipiZIDE (GLUCOTROL XL) 5 MG 24 hr tablet Take 5 mg by mouth 2 (two) times daily.    Marland Kitchen lisinopril-hydrochlorothiazide (PRINZIDE,ZESTORETIC) 20-12.5 MG per tablet Take 1 tablet by mouth daily.    Marland Kitchen loperamide (IMODIUM A-D) 2 MG tablet Take 2 mg by mouth 4 (four) times daily as needed for diarrhea or loose stools.    . metFORMIN (GLUCOPHAGE) 1000 MG tablet  0  . METFORMIN HCL PO Take 1,000 mg by mouth 2 (two) times daily.     Glory Rosebush VERIO test strip   0  . PROAIR HFA 108 (90 BASE) MCG/ACT inhaler Inhale 2 puffs into the lungs 4 (four) times daily as needed.    . prochlorperazine (COMPAZINE) 10 MG tablet Take 1 tablet (10 mg total) by mouth every 6 (six) hours as needed for nausea or vomiting. 30 tablet 0  . warfarin (COUMADIN) 5 MG tablet Take 1 tablet by mouth daily or as directed by coumadin clinic 90 tablet 1   No current facility-administered medications for this visit.      Allergies: No Known Allergies  Past Medical History, Surgical history, Social history, and Family History were reviewed and updated.   Physical Exam: Blood pressure 111/56, pulse 49, temperature 97.9 F (36.6 C), temperature source Oral, resp. rate 16, height 6' (1.829 m), weight 183 lb 1.6 oz (83.054 kg), SpO2 100 %. ECOG: 1 General appearance: alert and cooperative. Chronically ill-appearing gentleman.  Head: Normocephalic, without obvious abnormality Neck: no adenopathy Lymph nodes: Cervical, supraclavicular, and axillary nodes normal. Heart: Irregular slightly tachycardic. Lung:chest clear, no wheezing, rales, normal symmetric air entry Abdomin: soft, non-tender, without masses or organomegaly no shifting dullness or ascites. EXT:no erythema, induration, or nodules Neurological: No deficits.   Lab Results: Lab Results  Component Value Date   WBC 3.8* 11/20/2014   HGB 9.5* 11/20/2014   HCT 29.9* 11/20/2014   MCV 84.7 11/20/2014   PLT 329 11/20/2014     Chemistry      Component Value Date/Time   NA 142 10/29/2014 1335   NA 144 12/30/2009 2034   K 4.4 10/29/2014 1335   K 4.8 12/30/2009 2034   CL 106 12/30/2009 2034   CO2 24 10/29/2014 1335   CO2 26 12/30/2009 2034   BUN 18.7 10/29/2014 1335   BUN 11 12/30/2009 2034   CREATININE 1.4* 10/29/2014 1335   CREATININE 1.15 12/30/2009 2034      Component Value Date/Time   CALCIUM 9.0 10/29/2014 1335   CALCIUM 9.4 12/30/2009 2034   ALKPHOS 99 10/29/2014 1335   ALKPHOS 60 12/30/2009 2034   AST 20 10/29/2014 1335   AST 12 12/30/2009 2034   ALT 20 10/29/2014 1335   ALT <8 U/L 12/30/2009 2034   BILITOT 0.20 10/29/2014 1335   BILITOT 0.3 12/30/2009 2034       Impression and Plan:  69 year old gentleman with the following issues:  1. Gastric adenocarcinoma presented with an ulcer in the lesser curvature of the stomach that is biopsy proven. He is status post radiation therapy completed successfully.   PET  scan on 03/17/2014 did not show any clear-cut bulky metastasis. He continues to have small gastrohepatic lymph node with some activity that could suggest metastatic disease.  His repeat PET scan in July 2016  showed more progression of disease with rather low volume disease. He is currently on oral Xeloda and have tolerated it well for 2 cycles.   I plan on continuing with the same dose and schedule and repeat imaging studies after he completes 4 cycles. If his response is inadequate, I will add either taxanes or platinum agent to Xeloda.   2. Iron deficiency anemia: He is status post IV iron in August 2016. His hemoglobin is improving slowly and we'll continue to monitor on a regular basis. He might require further infusions in the future.  3. Follow-up: Will be in one month to follow-up on  his clinical status.  [facility], MD 9/16/20168:56 AM

## 2014-12-03 ENCOUNTER — Other Ambulatory Visit: Payer: Self-pay | Admitting: Cardiology

## 2014-12-03 DIAGNOSIS — H52223 Regular astigmatism, bilateral: Secondary | ICD-10-CM | POA: Diagnosis not present

## 2014-12-03 DIAGNOSIS — E119 Type 2 diabetes mellitus without complications: Secondary | ICD-10-CM | POA: Diagnosis not present

## 2014-12-03 DIAGNOSIS — Z961 Presence of intraocular lens: Secondary | ICD-10-CM | POA: Diagnosis not present

## 2014-12-04 NOTE — Telephone Encounter (Signed)
err

## 2014-12-11 ENCOUNTER — Telehealth: Payer: Self-pay | Admitting: Oncology

## 2014-12-11 ENCOUNTER — Ambulatory Visit (HOSPITAL_BASED_OUTPATIENT_CLINIC_OR_DEPARTMENT_OTHER): Payer: Medicare Other | Admitting: Oncology

## 2014-12-11 ENCOUNTER — Other Ambulatory Visit (HOSPITAL_BASED_OUTPATIENT_CLINIC_OR_DEPARTMENT_OTHER): Payer: Medicare Other

## 2014-12-11 VITALS — BP 111/38 | HR 82 | Temp 97.9°F | Resp 20 | Ht 73.0 in | Wt 181.9 lb

## 2014-12-11 DIAGNOSIS — D509 Iron deficiency anemia, unspecified: Secondary | ICD-10-CM | POA: Diagnosis not present

## 2014-12-11 DIAGNOSIS — C163 Malignant neoplasm of pyloric antrum: Secondary | ICD-10-CM

## 2014-12-11 LAB — COMPREHENSIVE METABOLIC PANEL (CC13)
ALT: 12 U/L (ref 0–55)
AST: 18 U/L (ref 5–34)
Albumin: 3.8 g/dL (ref 3.5–5.0)
Alkaline Phosphatase: 90 U/L (ref 40–150)
Anion Gap: 8 mEq/L (ref 3–11)
BUN: 20.3 mg/dL (ref 7.0–26.0)
CHLORIDE: 107 meq/L (ref 98–109)
CO2: 26 meq/L (ref 22–29)
Calcium: 9.1 mg/dL (ref 8.4–10.4)
Creatinine: 1.2 mg/dL (ref 0.7–1.3)
EGFR: 73 mL/min/{1.73_m2} — AB (ref >90)
GLUCOSE: 134 mg/dL (ref 70–140)
POTASSIUM: 4.2 meq/L (ref 3.5–5.1)
SODIUM: 140 meq/L (ref 136–145)
Total Bilirubin: <0.3 mg/dL (ref 0.20–1.20)
Total Protein: 7.1 g/dL (ref 6.4–8.3)

## 2014-12-11 LAB — CBC WITH DIFFERENTIAL/PLATELET
BASO%: 1.5 % (ref 0.0–2.0)
BASOS ABS: 0.1 10*3/uL (ref 0.0–0.1)
EOS%: 1.3 % (ref 0.0–7.0)
Eosinophils Absolute: 0.1 10*3/uL (ref 0.0–0.5)
HCT: 30.2 % — ABNORMAL LOW (ref 38.4–49.9)
HEMOGLOBIN: 9.6 g/dL — AB (ref 13.0–17.1)
LYMPH%: 15.7 % (ref 14.0–49.0)
MCH: 27.1 pg — AB (ref 27.2–33.4)
MCHC: 31.7 g/dL — AB (ref 32.0–36.0)
MCV: 85.2 fL (ref 79.3–98.0)
MONO#: 0.5 10*3/uL (ref 0.1–0.9)
MONO%: 10 % (ref 0.0–14.0)
NEUT#: 3.3 10*3/uL (ref 1.5–6.5)
NEUT%: 71.5 % (ref 39.0–75.0)
Platelets: 246 10*3/uL (ref 140–400)
RBC: 3.55 10*6/uL — AB (ref 4.20–5.82)
RDW: 29.5 % — AB (ref 11.0–14.6)
WBC: 4.6 10*3/uL (ref 4.0–10.3)
lymph#: 0.7 10*3/uL — ABNORMAL LOW (ref 0.9–3.3)

## 2014-12-11 NOTE — Progress Notes (Signed)
Hematology and Oncology Follow Up Visit  Patrick [name] [id number] 11/28/1945 69 y.o. 12/11/2014 9:51 AM Patrick [name], MDMackenzie, Patrick Edelman, MD   Principle Diagnosis: 69 year old gentleman with invasive adenocarcinoma of the stomach. He presented with a large elsewhere and iron deficiency anemia related to that in August of 2015. He is status post an endoscopy and a biopsy on 10/21/2013 confirmed the diagnosis. He presented with a hemoglobin of 7.9, MCV 68.8  Prior Therapy:  Status post IV iron 1 g given on 11/04/2013. This was repeated in August 2016. He is status post definitive radiation therapy between 12/03/2013 and 12/30/2013. He received 40 Gray in 20 fractions.  Current therapy: Xeloda at a dose of 1000 mg twice a day 2 weeks on 1 week off started in July 2016. He has completed 3 cycle of this time.  Interim History: Patrick [name] presents today for a followup visit by himself. Since his last visit, he reports no complications associated with Xeloda. He continues to receive it without any issue. Does not report any diarrhea, hand-foot syndrome, mucositis.  He does not report any nausea, vomiting or abdominal pain. He does not report any fatigue or decline in energy her performance status. He is able to ambulate with a cane and uses public transportation.  He does not report any hematochezia or melanoma. He has reported improvement in his energy after IV iron infusion.   He does not report any hemoptysis or hematemesis. He is reported headaches or blurred vision or syncope. Does not report any fevers, chills, sweats or weight loss. Does not report any chest pain, orthopnea, palpitations or PND. Does report any cough or wheezing or shortness of breath. He has not reported any urinary symptoms of frequency urgency or hesitancy. He does not report any skeletal complaints of arthralgias or myalgias. He has not reported any lymphadenopathy or petechiae. He reports no skin rashes or lesions. His  performance status still limited but have not declined. Rest of her review of systems unremarkable.      Medications: I have reviewed the patient's current medications.  Current Outpatient Prescriptions  Medication Sig Dispense Refill  . aspirin 81 MG EC tablet Take 81 mg by mouth daily.      Marland Kitchen atorvastatin (LIPITOR) 10 MG tablet Take 10 mg by mouth daily.      . capecitabine (XELODA) 500 MG tablet Take 2 tablets by mouth twice a day. 120 tablet 0  . carvedilol (COREG) 6.25 MG tablet Take 1 tablet (6.25 mg total) by mouth 2 (two) times daily with a meal. 180 tablet 2  . DILT-XR 180 MG 24 hr capsule take 1 capsule by mouth once daily 30 capsule 11  . ferrous sulfate 325 (65 FE) MG tablet Take 325 mg by mouth 3 (three) times daily.  0  . furosemide (LASIX) 20 MG tablet Take 1 tablet by mouth daily.  0  . glipiZIDE (GLUCOTROL XL) 5 MG 24 hr tablet Take 5 mg by mouth 2 (two) times daily.    Marland Kitchen lisinopril-hydrochlorothiazide (PRINZIDE,ZESTORETIC) 20-12.5 MG per tablet Take 1 tablet by mouth daily.    Marland Kitchen loperamide (IMODIUM A-D) 2 MG tablet Take 2 mg by mouth 4 (four) times daily as needed for diarrhea or loose stools.    . metFORMIN (GLUCOPHAGE) 1000 MG tablet   0  . METFORMIN HCL PO Take 1,000 mg by mouth 2 (two) times daily.     Glory Rosebush VERIO test strip   0  . PROAIR HFA 108 (90 BASE) MCG/ACT inhaler  Inhale 2 puffs into the lungs 4 (four) times daily as needed.    . prochlorperazine (COMPAZINE) 10 MG tablet Take 1 tablet (10 mg total) by mouth every 6 (six) hours as needed for nausea or vomiting. 30 tablet 0  . warfarin (COUMADIN) 5 MG tablet take 1 tablet by mouth once daily or as directed 90 tablet 1   No current facility-administered medications for this visit.     Allergies: No Known Allergies  Past Medical History, Surgical history, Social history, and Family History were reviewed and updated.   Physical Exam: Blood pressure 111/38, pulse 82, temperature 97.9 F (36.6 C),  temperature source Oral, resp. rate 20, height 6\' 1"  (1.854 m), weight 181 lb 14.4 oz (82.509 kg), SpO2 100 %. ECOG: 1 General appearance: alert and cooperative. Not in any distress today. Head: Normocephalic, without obvious abnormality no oral ulcers or lesions. Neck: no adenopathy Lymph nodes: Cervical, supraclavicular, and axillary nodes normal. Heart: Irregular slightly tachycardic. Lung:chest clear, no wheezing, rales, normal symmetric air entry Abdomin: soft, non-tender, without masses or organomegaly no shifting dullness or ascites. EXT:no erythema, induration, or nodules Neurological: No deficits.   Lab Results: Lab Results  Component Value Date   WBC 4.6 12/11/2014   HGB 9.6* 12/11/2014   HCT 30.2* 12/11/2014   MCV 85.2 12/11/2014   PLT 246 12/11/2014     Chemistry      Component Value Date/Time   NA 140 11/20/2014 0818   NA 144 12/30/2009 2034   K 4.6 11/20/2014 0818   K 4.8 12/30/2009 2034   CL 106 12/30/2009 2034   CO2 24 11/20/2014 0818   CO2 26 12/30/2009 2034   BUN 19.9 11/20/2014 0818   BUN 11 12/30/2009 2034   CREATININE 1.4* 11/20/2014 0818   CREATININE 1.15 12/30/2009 2034      Component Value Date/Time   CALCIUM 9.2 11/20/2014 0818   CALCIUM 9.4 12/30/2009 2034   ALKPHOS 88 11/20/2014 0818   ALKPHOS 60 12/30/2009 2034   AST 19 11/20/2014 0818   AST 12 12/30/2009 2034   ALT 15 11/20/2014 0818   ALT <8 U/L 12/30/2009 2034   BILITOT <0.20 11/20/2014 0818   BILITOT 0.3 12/30/2009 2034       Impression and Plan:  69 year old gentleman with the following issues:  1. Gastric adenocarcinoma presented with an ulcer in the lesser curvature of the stomach that is biopsy proven. He is status post radiation therapy completed successfully.   PET scan on 03/17/2014 did not show any clear-cut bulky metastasis. He continues to have small gastrohepatic lymph node with some activity that could suggest metastatic disease.  His repeat PET scan in July 2016   showed more progression of disease with rather low volume disease. He is currently on oral Xeloda and so far reports no major complications. The plan is to continue with the current medication and repeat imaging studies before the next visit in 11-Jan-2015.  If the cancer showed an adequate response, we can add a different agent to Xeloda.   2. Iron deficiency anemia: He is status post IV iron in August 2016. His hemoglobin is improving slowly and we'll continue to monitor on a regular basis. He might require further infusions in the future.  3. Follow-up: Will be in one month to follow-up on his clinical status.  SHADAD,FIRAS, MD 10/7/20169:51 AM

## 2014-12-11 NOTE — Telephone Encounter (Signed)
Gave and printed appt sched and avs fo rpt; for NOV  °

## 2014-12-20 ENCOUNTER — Emergency Department (HOSPITAL_COMMUNITY): Payer: Medicare Other

## 2014-12-20 ENCOUNTER — Encounter (HOSPITAL_COMMUNITY): Payer: Self-pay | Admitting: *Deleted

## 2014-12-20 ENCOUNTER — Inpatient Hospital Stay (HOSPITAL_COMMUNITY)
Admission: EM | Admit: 2014-12-20 | Discharge: 2014-12-24 | DRG: 813 | Disposition: A | Discharge status: Home Health | Payer: Medicare Other | Attending: Internal Medicine | Admitting: Internal Medicine

## 2014-12-20 DIAGNOSIS — I481 Persistent atrial fibrillation: Secondary | ICD-10-CM | POA: Diagnosis not present

## 2014-12-20 DIAGNOSIS — Z87891 Personal history of nicotine dependence: Secondary | ICD-10-CM

## 2014-12-20 DIAGNOSIS — Z79899 Other long term (current) drug therapy: Secondary | ICD-10-CM | POA: Diagnosis not present

## 2014-12-20 DIAGNOSIS — R531 Weakness: Secondary | ICD-10-CM

## 2014-12-20 DIAGNOSIS — R791 Abnormal coagulation profile: Secondary | ICD-10-CM | POA: Diagnosis not present

## 2014-12-20 DIAGNOSIS — E860 Dehydration: Secondary | ICD-10-CM | POA: Diagnosis present

## 2014-12-20 DIAGNOSIS — D689 Coagulation defect, unspecified: Secondary | ICD-10-CM | POA: Diagnosis present

## 2014-12-20 DIAGNOSIS — C163 Malignant neoplasm of pyloric antrum: Secondary | ICD-10-CM | POA: Diagnosis not present

## 2014-12-20 DIAGNOSIS — D6832 Hemorrhagic disorder due to extrinsic circulating anticoagulants: Principal | ICD-10-CM | POA: Diagnosis present

## 2014-12-20 DIAGNOSIS — Z7984 Long term (current) use of oral hypoglycemic drugs: Secondary | ICD-10-CM | POA: Diagnosis not present

## 2014-12-20 DIAGNOSIS — Z7982 Long term (current) use of aspirin: Secondary | ICD-10-CM

## 2014-12-20 DIAGNOSIS — I10 Essential (primary) hypertension: Secondary | ICD-10-CM | POA: Diagnosis present

## 2014-12-20 DIAGNOSIS — I959 Hypotension, unspecified: Secondary | ICD-10-CM | POA: Diagnosis present

## 2014-12-20 DIAGNOSIS — K922 Gastrointestinal hemorrhage, unspecified: Secondary | ICD-10-CM | POA: Diagnosis present

## 2014-12-20 DIAGNOSIS — N179 Acute kidney failure, unspecified: Secondary | ICD-10-CM | POA: Diagnosis present

## 2014-12-20 DIAGNOSIS — I420 Dilated cardiomyopathy: Secondary | ICD-10-CM | POA: Diagnosis present

## 2014-12-20 DIAGNOSIS — E785 Hyperlipidemia, unspecified: Secondary | ICD-10-CM | POA: Diagnosis present

## 2014-12-20 DIAGNOSIS — T45515A Adverse effect of anticoagulants, initial encounter: Secondary | ICD-10-CM | POA: Diagnosis not present

## 2014-12-20 DIAGNOSIS — I482 Chronic atrial fibrillation, unspecified: Secondary | ICD-10-CM | POA: Diagnosis present

## 2014-12-20 DIAGNOSIS — E119 Type 2 diabetes mellitus without complications: Secondary | ICD-10-CM

## 2014-12-20 DIAGNOSIS — E872 Acidosis: Secondary | ICD-10-CM | POA: Diagnosis not present

## 2014-12-20 DIAGNOSIS — Z7901 Long term (current) use of anticoagulants: Secondary | ICD-10-CM

## 2014-12-20 DIAGNOSIS — I272 Other secondary pulmonary hypertension: Secondary | ICD-10-CM | POA: Diagnosis present

## 2014-12-20 DIAGNOSIS — E11649 Type 2 diabetes mellitus with hypoglycemia without coma: Secondary | ICD-10-CM | POA: Diagnosis not present

## 2014-12-20 DIAGNOSIS — D62 Acute posthemorrhagic anemia: Secondary | ICD-10-CM | POA: Diagnosis not present

## 2014-12-20 DIAGNOSIS — I425 Other restrictive cardiomyopathy: Secondary | ICD-10-CM | POA: Diagnosis not present

## 2014-12-20 LAB — PREPARE RBC (CROSSMATCH)

## 2014-12-20 LAB — PROTIME-INR
INR: 7.52 — AB (ref 0.00–1.49)
INR: 1.2 (ref 0.00–1.49)
INR: 1.23 (ref 0.00–1.49)
PROTHROMBIN TIME: 61.1 s — AB (ref 11.6–15.2)
PROTHROMBIN TIME: 15.4 s — AB (ref 11.6–15.2)
PROTHROMBIN TIME: 15.7 s — AB (ref 11.6–15.2)

## 2014-12-20 LAB — BASIC METABOLIC PANEL
Anion gap: 7 (ref 5–15)
BUN: 51 mg/dL — ABNORMAL HIGH (ref 6–20)
CALCIUM: 8 mg/dL — AB (ref 8.9–10.3)
CO2: 17 mmol/L — AB (ref 22–32)
CREATININE: 1.2 mg/dL (ref 0.61–1.24)
Chloride: 113 mmol/L — ABNORMAL HIGH (ref 101–111)
GFR calc Af Amer: >60 mL/min (ref >60)
GFR, EST NON AFRICAN AMERICAN: 59 mL/min — AB (ref >60)
GLUCOSE: 49 mg/dL — AB (ref 65–99)
Potassium: 4.7 mmol/L (ref 3.5–5.1)
Sodium: 137 mmol/L (ref 135–145)

## 2014-12-20 LAB — URINALYSIS, ROUTINE W REFLEX MICROSCOPIC
Bilirubin Urine: NEGATIVE
GLUCOSE, UA: NEGATIVE mg/dL
Hgb urine dipstick: NEGATIVE
Ketones, ur: NEGATIVE mg/dL
LEUKOCYTES UA: NEGATIVE
Nitrite: NEGATIVE
PROTEIN: NEGATIVE mg/dL
Specific Gravity, Urine: 1.015 (ref 1.005–1.030)
Urobilinogen, UA: 1 mg/dL (ref 0.0–1.0)
pH: 5 (ref 5.0–8.0)

## 2014-12-20 LAB — I-STAT CG4 LACTIC ACID, ED
LACTIC ACID, VENOUS: 2.84 mmol/L — AB (ref 0.5–2.0)
Lactic Acid, Venous: 2.61 mmol/L (ref 0.5–2.0)

## 2014-12-20 LAB — COMPREHENSIVE METABOLIC PANEL
ALK PHOS: 56 U/L (ref 38–126)
ALT: 16 U/L — AB (ref 17–63)
AST: 25 U/L (ref 15–41)
Albumin: 3.5 g/dL (ref 3.5–5.0)
Anion gap: 9 (ref 5–15)
BUN: 64 mg/dL — ABNORMAL HIGH (ref 6–20)
CALCIUM: 8.9 mg/dL (ref 8.9–10.3)
CO2: 21 mmol/L — ABNORMAL LOW (ref 22–32)
CREATININE: 1.77 mg/dL — AB (ref 0.61–1.24)
Chloride: 106 mmol/L (ref 101–111)
GFR, EST AFRICAN AMERICAN: 43 mL/min — AB (ref >60)
GFR, EST NON AFRICAN AMERICAN: 37 mL/min — AB (ref >60)
Glucose, Bld: 150 mg/dL — ABNORMAL HIGH (ref 65–99)
Potassium: 5.1 mmol/L (ref 3.5–5.1)
Sodium: 136 mmol/L (ref 135–145)
Total Bilirubin: 0.5 mg/dL (ref 0.3–1.2)
Total Protein: 6.4 g/dL — ABNORMAL LOW (ref 6.5–8.1)

## 2014-12-20 LAB — ABO/RH: ABO/RH(D): A POS

## 2014-12-20 LAB — CBC WITH DIFFERENTIAL/PLATELET
BASOS ABS: 0.1 10*3/uL (ref 0.0–0.1)
Basophils Relative: 1 %
EOS ABS: 0 10*3/uL (ref 0.0–0.7)
Eosinophils Relative: 0 %
HCT: 18.2 % — ABNORMAL LOW (ref 39.0–52.0)
Hemoglobin: 5.8 g/dL — CL (ref 13.0–17.0)
LYMPHS ABS: 0.7 10*3/uL (ref 0.7–4.0)
Lymphocytes Relative: 13 %
MCH: 26.7 pg (ref 26.0–34.0)
MCHC: 31.9 g/dL (ref 30.0–36.0)
MCV: 83.9 fL (ref 78.0–100.0)
MONO ABS: 0.3 10*3/uL (ref 0.1–1.0)
Monocytes Relative: 6 %
NEUTROS PCT: 80 %
Neutro Abs: 4.6 10*3/uL (ref 1.7–7.7)
PLATELETS: 296 10*3/uL (ref 150–400)
RBC: 2.17 MIL/uL — AB (ref 4.22–5.81)
RDW: 26 % — AB (ref 11.5–15.5)
WBC: 5.7 10*3/uL (ref 4.0–10.5)

## 2014-12-20 LAB — MRSA PCR SCREENING: MRSA by PCR: NEGATIVE

## 2014-12-20 LAB — CBG MONITORING, ED: GLUCOSE-CAPILLARY: 120 mg/dL — AB (ref 65–99)

## 2014-12-20 LAB — I-STAT TROPONIN, ED: TROPONIN I, POC: 0.01 ng/mL (ref 0.00–0.08)

## 2014-12-20 LAB — APTT: aPTT: 32 seconds (ref 24–37)

## 2014-12-20 LAB — POC OCCULT BLOOD, ED: FECAL OCCULT BLD: POSITIVE — AB

## 2014-12-20 LAB — HEMATOCRIT: HEMATOCRIT: 19.9 % — AB (ref 39.0–52.0)

## 2014-12-20 LAB — HEMOGLOBIN
HEMOGLOBIN: 6.4 g/dL — AB (ref 13.0–17.0)
HEMOGLOBIN: 6.7 g/dL — AB (ref 13.0–17.0)

## 2014-12-20 MED ORDER — PANTOPRAZOLE SODIUM 40 MG IV SOLR
80.0000 mg | Freq: Once | INTRAVENOUS | Status: AC
  Administered 2014-12-20: 80 mg via INTRAVENOUS
  Filled 2014-12-20: qty 80

## 2014-12-20 MED ORDER — ALBUTEROL SULFATE HFA 108 (90 BASE) MCG/ACT IN AERS: 2.0000 | INHALATION_SPRAY | Freq: Four times a day (QID) | RESPIRATORY_TRACT | Status: DC | PRN

## 2014-12-20 MED ORDER — SODIUM CHLORIDE 0.9 % IV SOLN
8.0000 mg/h | INTRAVENOUS | Status: DC
  Administered 2014-12-20 – 2014-12-22 (×4): 8 mg/h via INTRAVENOUS
  Filled 2014-12-20 (×13): qty 80

## 2014-12-20 MED ORDER — SODIUM CHLORIDE 0.9 % IJ SOLN
3.0000 mL | Freq: Two times a day (BID) | INTRAMUSCULAR | Status: DC
  Administered 2014-12-20 – 2014-12-24 (×8): 3 mL via INTRAVENOUS

## 2014-12-20 MED ORDER — SODIUM CHLORIDE 0.9 % IV SOLN
10.0000 mL/h | Freq: Once | INTRAVENOUS | Status: AC
  Administered 2014-12-20: 10 mL/h via INTRAVENOUS

## 2014-12-20 MED ORDER — PROMETHAZINE HCL 25 MG PO TABS: 12.5000 mg | ORAL_TABLET | Freq: Four times a day (QID) | ORAL | Status: DC | PRN

## 2014-12-20 MED ORDER — ONDANSETRON HCL 4 MG/2ML IJ SOLN: 4.0000 mg | Freq: Four times a day (QID) | INTRAMUSCULAR | Status: DC | PRN

## 2014-12-20 MED ORDER — SODIUM CHLORIDE 0.9 % IV SOLN: Freq: Once | INTRAVENOUS | Status: DC

## 2014-12-20 MED ORDER — SODIUM CHLORIDE 0.9 % IV BOLUS (SEPSIS)
1000.0000 mL | Freq: Once | INTRAVENOUS | Status: AC
  Administered 2014-12-20: 1000 mL via INTRAVENOUS

## 2014-12-20 MED ORDER — PROTHROMBIN COMPLEX CONC HUMAN 500 UNITS IV KIT
3818.0000 [IU] | PACK | Status: AC
  Administered 2014-12-20: 3818 [IU] via INTRAVENOUS
  Filled 2014-12-20: qty 153

## 2014-12-20 MED ORDER — SODIUM CHLORIDE 0.9 % IV SOLN
INTRAVENOUS | Status: DC
  Administered 2014-12-20 – 2014-12-21 (×4): via INTRAVENOUS

## 2014-12-20 MED ORDER — VITAMIN K1 10 MG/ML IJ SOLN
10.0000 mg | INTRAMUSCULAR | Status: AC
  Administered 2014-12-20: 10 mg via INTRAVENOUS
  Filled 2014-12-20: qty 1

## 2014-12-20 MED ORDER — SODIUM CHLORIDE 0.9 % IV SOLN
Freq: Once | INTRAVENOUS | Status: AC
  Administered 2014-12-20: 22:00:00 via INTRAVENOUS

## 2014-12-20 MED ORDER — SODIUM CHLORIDE 0.9 % IV BOLUS (SEPSIS): 2500.0000 mL | Freq: Once | INTRAVENOUS | Status: DC

## 2014-12-20 MED ORDER — SODIUM CHLORIDE 0.9 % IV BOLUS (SEPSIS)
1500.0000 mL | Freq: Once | INTRAVENOUS | Status: AC
  Administered 2014-12-20: 1500 mL via INTRAVENOUS

## 2014-12-20 MED ORDER — PANTOPRAZOLE SODIUM 40 MG IV SOLR: 40.0000 mg | Freq: Once | INTRAVENOUS | Status: DC

## 2014-12-20 NOTE — ED Notes (Signed)
Pt reports generalized fatigue and weakness x 2 days. No acute distress noted at triage.

## 2014-12-20 NOTE — ED Provider Notes (Addendum)
CSN: [id number]     Arrival date & time 12/20/14  1038 History   First MD Initiated Contact with Patient 12/20/14 1054     Chief Complaint  Patient presents with  . Weakness     (Consider location/radiation/quality/duration/timing/severity/associated sxs/prior Treatment) HPI 69 year old male who presents today after an episode of near-syncope at church. He states that he has been generally weak. He states this has been going on for about 3 days but worsened today when he was up at church. Church members were insistent that he come to the ED. He denies headache, neck pain, chest pain, dyspnea, abdominal pain, or lateralized weakness. He has a history of gastric carcinoma and is reportedly undergoing oral chemotherapy. He reports that he is in the second week of a 2 week cycle. He is on Coumadin for chronic atrial fibrillation. He states that he has had it checked regularly and his INR has been normal. Past Medical History  Diagnosis Date  . Diabetes mellitus   . High blood pressure   . Stroke [facility]) 2008    residual left sided weakness  . Dyslipidemia   . Persistent atrial fibrillation ([place]) 01/14/2009    afib--cardioversion successful x1 shock; recurrent A. fib  . Bilateral cataracts 2014     status post extraction  . Stomach cancer ([place]) 10/21/13  . 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 Hernia repair    . Cardiac catheterization  01/15/2009    LV 40%, no occlusive coronary disease,norenal artery stenosis or abdmoninal aotric aneurysm  . Doppler echocardiography  10 /2012    EF normal -probably elevated left atrial pressures unable to asses due to afib.;mildly scelrotic aortic valve but  no stenosis and mildlyelevated right atrial  pressures and pulm. pressures at 45-50 mmhg with mild to mod pulm  htn no change from 2011  . Cataract extraction, bilateral  2014  . Biopsy stomach  10/21/13    adenocarcinoma   History  reviewed. No pertinent family history. Social History  Substance Use Topics  . Smoking status: Former Smoker -- 1.00 packs/day for 45 years    Types: Cigarettes    Quit date: 11/21/2008  . Smokeless tobacco: Never Used  . Alcohol Use: No     Comment: quit drinking 6 years ago,    Review of Systems    Allergies  Review of patient's allergies indicates no known allergies.  Home Medications   Prior to Admission medications   Medication Sig Start Date End Date Taking? Authorizing Provider  aspirin 81 MG EC tablet Take 81 mg by mouth at bedtime.    Yes Historical Provider, MD  atorvastatin (LIPITOR) 10 MG tablet Take 10 mg by mouth daily.     Yes Historical Provider, MD  capecitabine (XELODA) 500 MG tablet Take 2 tablets by mouth twice a day. Patient taking differently: Take 1,000 mg by mouth 2 (two) times daily after a meal. Take 2 tablets by mouth twice a day. 10/20/14  Yes Wyatt Portela, MD  carvedilol (COREG) 6.25 MG tablet Take 1 tablet (6.25 mg total) by mouth 2 (two) times daily with a meal. 03/20/13  Yes Leonie Man, MD  DILT-XR 180 MG 24 hr capsule take 1 capsule by mouth once daily 05/27/14  Yes Leonie Man, MD  ferrous sulfate 325 (65 FE) MG tablet Take 325 mg by mouth 2 (two) times daily with a meal.  09/26/14  Yes Historical Provider, MD  furosemide (LASIX) 20 MG tablet Take 20 mg by mouth at bedtime.  04/17/14  Yes Historical Provider, MD  glipiZIDE (GLUCOTROL XL) 5 MG 24 hr tablet Take 5 mg by mouth 2 (two) times daily.   Yes Historical Provider, MD  lisinopril-hydrochlorothiazide (PRINZIDE,ZESTORETIC) 20-12.5 MG per tablet Take 1 tablet by mouth daily.   Yes Historical Provider, MD  loperamide (IMODIUM A-D) 2 MG tablet Take 2 mg by mouth 4 (four) times daily as needed for diarrhea or loose stools.   Yes Historical Provider, MD  metFORMIN (GLUCOPHAGE) 1000 MG tablet Take 1,000 mg by mouth 2 (two) times daily with a meal.  08/19/14  Yes Historical Provider, MD  PROAIR  HFA 108 (90 BASE) MCG/ACT inhaler Inhale 2 puffs into the lungs 4 (four) times daily as needed for wheezing or shortness of breath.  09/22/13  Yes Historical Provider, MD  warfarin (COUMADIN) 5 MG tablet take 1 tablet by mouth once daily or as directed Patient taking differently: TAKE 5 MG BY MOUTH DAILY ON SUN, MON, WED, AND FRI. TAKE 2.5 MG BY MOUTH DAILY ON TUES, THURS, AND SAT. 12/03/14  Yes Leonie Man, MD  prochlorperazine (COMPAZINE) 10 MG tablet Take 1 tablet (10 mg total) by mouth every 6 (six) hours as needed for nausea or vomiting. Patient not taking: Reported on 12/20/2014 12/09/13   Thea Silversmith, MD   BP 77/40 mmHg  Pulse 25  Temp(Src) 98.4 F (36.9 C) (Oral)  Resp 21  SpO2 93% Physical Exam  Constitutional: He is oriented to person, place, and time. He appears well-developed and well-nourished. No distress.  HENT:  Head: Normocephalic and atraumatic.  Right Ear: External ear normal.  Left Ear: External ear normal.  Nose: Nose normal.  Mouth/Throat: Oropharynx is clear and moist.  Eyes:  Conjunctiva are pale  Neck: Normal range of motion. Neck supple.  Cardiovascular: Normal rate, regular rhythm, normal heart sounds and intact distal pulses.   Pulmonary/Chest: Effort normal and breath sounds normal.  Abdominal: Soft. Bowel sounds are normal.  Genitourinary: Guaiac positive stool.  Tarry stool on rectal exam  Musculoskeletal: Normal range of motion.  Neurological: He is alert and oriented to person, place, and time.  Skin: There is pallor.  Psychiatric: He has a normal mood and affect.  Nursing note and vitals reviewed.   ED Course  Procedures (including critical care time) Labs Review Labs Reviewed  CBG MONITORING, ED - Abnormal; Notable for the following:    Glucose-Capillary 120 (*)    All other components within normal limits  POC OCCULT BLOOD, ED - Abnormal; Notable for the following:    Fecal Occult Bld POSITIVE (*)    All other components within  normal limits  I-STAT CG4 LACTIC ACID, ED - Abnormal; Notable for the following:    Lactic Acid, Venous 2.61 (*)    All other components within normal limits  CULTURE, BLOOD (ROUTINE X 2)  CULTURE, BLOOD (ROUTINE X 2)  URINALYSIS, ROUTINE W REFLEX MICROSCOPIC (NOT AT [facility])  CBC WITH DIFFERENTIAL/PLATELET  PROTIME-INR  I-STAT TROPOININ, ED  I-STAT TROPOININ, ED  PREPARE RBC (CROSSMATCH)  TYPE AND SCREEN    Imaging Review No results found. I have personally reviewed and evaluated these images and lab results as part of my medical decision-making.   EKG Interpretation None      MDM   Final diagnoses:  Gastrointestinal hemorrhage, unspecified gastritis, unspecified gastrointestinal hemorrhage type  Elevated INR    Patient is hypotensive with systolic blood pressure 85.  His Hemoccult is positive. I'm awaiting hemoglobin but have ordered 2 units of packed red blood cells. He is receiving IV fluids.  Patient with hemoglobin of 5.8 and INR elevated at 7.5. I have initiated orders through the reversal protocol. Eppie Gibson is ordered. Patient is receiving his second unit of packed red blood cells and systolic blood pressure is 100  Pattricia Boss, MD 12/20/14 1539  CRITICAL CARE Performed by: Shaune Pollack Total critical care time: 75 Critical care time was exclusive of separately billable procedures and treating other patients. Critical care was necessary to treat or prevent imminent or life-threatening deterioration. Critical care was time spent personally by me on the following activities: development of treatment plan with patient and/or surrogate as well as nursing, discussions with consultants, evaluation of patient's response to treatment, examination of patient, obtaining history from patient or surrogate, ordering and performing treatments and interventions, ordering and review of laboratory studies, ordering and review of radiographic studies, pulse oximetry and re-evaluation  of patient's condition.   Pattricia Boss, MD 12/20/14 1540

## 2014-12-20 NOTE — Progress Notes (Signed)
Dr. Rocky Morel and wanted  To know if the GI consult was done. No record in chart thus far.  DrTamala Julian called back to make aware that the GI consult is with Dr. Watt Climes and to page him if he has not seen patient by 9 pm tonight .Night shift nurse Rojelio Brenner is aware.

## 2014-12-20 NOTE — H&P (Addendum)
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 Patient notes that he was at church this morning singing in the choir when he got extremely weak and had to sit down. Members from the church persuaded him to come into the hospital. Initially thought to be septic upon further investigation his INR was noted to be significantly elevated at 7.5 and his hemoglobin was 5.8. Patient notes that he's been having dark stools for the last 3 weeks as well as has noticed increased weakness and fatigue over the last 1 week. He attributed the weakness likely due to his chemotherapy treatments which she is receiving for his gastric cancer. He notes that his last INR was 2.7 on 11/16/2014.  GI had been consult about the patient by ED physician.   Review of Systems: A complete review of systems was performed and negative except for as noted above in history of present illness   Past Medical History  Diagnosis Date  . Diabetes mellitus   . High blood pressure   . Stroke [facility]) 2008    residual left sided weakness  . Dyslipidemia   . Persistent atrial fibrillation ([place]) 01/14/2009    afib--cardioversion successful x1 shock; recurrent A. fib  . Bilateral cataracts 2014     status post extraction  . Stomach cancer (Patrick [name]) 10/21/13  . TxN1 adenocarcinoma of the gastric antrum 11/19/2013     Past Surgical History  Procedure Laterality Date  . Hernia repair    . Cardiac catheterization  01/15/2009    LV 40%, no occlusive coronary  disease,norenal artery stenosis or abdmoninal aotric aneurysm  . Doppler echocardiography  10 /2012    EF normal -probably elevated left atrial pressures unable to asses due to afib.;mildly scelrotic aortic valve but  no stenosis and mildlyelevated right atrial  pressures and pulm. pressures at 45-50 mmhg with mild to mod pulm  htn no change from 2011  . Cataract extraction, bilateral  2014  . Biopsy stomach  10/21/13    adenocarcinoma      Social History:  reports that he quit smoking about 6 years ago. His smoking use included Cigarettes. He has a 45 pack-year smoking history. He has never used smokeless tobacco. He reports that he does not drink alcohol or use illicit drugs. where does patient live--home  Can patient participate in ADLs? Yes  No Known Allergies  History reviewed. No pertinent family history.    FAMILY HISTORY  When questioned  Directly-patient reports  No family history of HTN, CVA ,DIABETES, TB, Cancer CAD, Bleeding Disorders, Sickle Cell, diabetes, anemia, asthma,   Prior to Admission medications   Medication Sig Start Date End Date Taking? Authorizing Provider  aspirin 81 MG EC tablet Take 81 mg by mouth at bedtime.    Yes Historical Provider, MD  atorvastatin (LIPITOR) 10 MG tablet Take 10 mg by mouth daily.     Yes Historical Provider, MD  capecitabine (XELODA) 500 MG tablet Take 2 tablets by mouth twice a day. Patient taking differently: Take 1,000 mg by mouth 2 (two) times daily after a meal. Take 2 tablets by mouth twice a day. 10/20/14  Yes Wyatt Portela, MD  carvedilol (COREG) 6.25 MG tablet Take 1 tablet (6.25 mg total) by mouth 2 (two) times daily with a meal. 03/20/13  Yes Leonie Man, MD  DILT-XR 180 MG 24 hr capsule take 1 capsule by mouth once daily 05/27/14  Yes Leonie Man, MD  ferrous sulfate 325 (65 FE) MG tablet Take 325 mg by mouth 2 (two) times daily with a meal.  09/26/14  Yes Historical Provider, MD  furosemide (LASIX) 20 MG tablet Take  20 mg by mouth at bedtime.  04/17/14  Yes Historical Provider, MD  glipiZIDE (GLUCOTROL XL) 5 MG 24 hr tablet Take 5 mg by mouth 2 (two) times daily.   Yes Historical Provider, MD  lisinopril-hydrochlorothiazide (PRINZIDE,ZESTORETIC) 20-12.5 MG per tablet Take 1 tablet by mouth daily.   Yes Historical Provider, MD  loperamide (IMODIUM A-D) 2 MG tablet Take 2 mg by mouth 4 (four) times daily as needed for diarrhea or loose stools.   Yes Historical Provider, MD  metFORMIN (GLUCOPHAGE) 1000 MG tablet Take 1,000 mg by mouth 2 (two) times daily with a meal.  08/19/14  Yes Historical Provider, MD  PROAIR HFA 108 (90 BASE) MCG/ACT inhaler Inhale 2 puffs into the lungs 4 (four) times daily as needed for wheezing or shortness of breath.  09/22/13  Yes Historical Provider, MD  warfarin (COUMADIN) 5 MG tablet take 1 tablet by mouth once daily or as directed Patient taking differently: TAKE 5 MG BY MOUTH DAILY ON SUN, MON, WED, AND FRI. TAKE 2.5 MG BY MOUTH DAILY ON TUES, THURS, AND SAT. 12/03/14  Yes Leonie Man, MD  prochlorperazine (COMPAZINE) 10 MG tablet Take 1 tablet (10 mg total) by mouth every 6 (six) hours as needed for nausea or vomiting. Patient not taking: Reported on 12/20/2014 12/09/13   Thea Silversmith, MD     Physical Exam: Filed Vitals:   12/20/14 1515 12/20/14 1525 12/20/14 1530 12/20/14 1545  BP: 95/46 99/50 102/67 104/51  Pulse: 80 91 77 46  Temp:  98.1 F (36.7 C)    TempSrc:  Oral    Resp: 23 26 23 20   SpO2: 100% 100% 100% 100%     Constitutional: Vital signs reviewed. Patient is a Alert and oriented x3. Patient really in no acute distress at this time comfortably sitting in bed Head: Normocephalic and atraumatic  Ear: TM normal bilaterally  Mouth: no erythema or exudates,  dry mucous membranes Eyes: PERRL, EOMI, conjunctivae normal, No scleral icterus.  Neck: Supple, Trachea midline normal ROM, No JVD, mass, thyromegaly, or carotid bruit present.  Cardiovascular:Irregular  regular heart rhythm Chest: CTAB, no wheezes, rales, or rhonchi  Abdominal: Soft. Non-tender, non-distended, bowel sounds are normal, no masses, organomegaly, or guarding present.  GU: no CVA tenderness Musculoskeletal: No joint deformities, erythema, or stiffness, ROM full and no nontender Ext: no edema and no cyanosis, pulses palpable bilaterally (DP and PT)  Hematology: no cervical, inginal, or axillary adenopathy.  Neurological: A&O x3, Strenght is normal and symmetric bilaterally, cranial nerve II-XII are grossly intact, no focal motor deficit, sensory intact to light touch bilaterally.  Skin: Warm, dry and intact. No rash, cyanosis, or clubbing.  Psychiatric: Normal mood and affect. speech and behavior is normal. Judgment and thought content normal. Cognition and memory are normal.      Data Review   Micro Results No results found for this or any previous visit (from the past 240 hour(s)).  Radiology Reports Dg Chest [facility]  12/20/2014  CLINICAL DATA:  Weakness, history of atrial fibrillation EXAM: PORTABLE CHEST 1 VIEW COMPARISON:  09/16/2014 FINDINGS: Borderline cardiomegaly. No acute infiltrate or pleural effusion. No pulmonary edema. Bony thorax is unremarkable. IMPRESSION: No active disease. Electronically Signed   By: Lahoma Crocker M.D.   On: 12/20/2014 12:19     CBC  Recent Labs Lab 12/20/14 1121  WBC 5.7  HGB 5.8*  HCT 18.2*  PLT 296  MCV 83.9  MCH 26.7  MCHC 31.9  RDW 26.0*  LYMPHSABS 0.7  MONOABS 0.3  EOSABS 0.0  BASOSABS 0.1    Chemistries   Recent Labs Lab 12/20/14 1137  NA 136  K 5.1  CL 106  CO2 21*  GLUCOSE 150*  BUN 64*  CREATININE 1.77*  CALCIUM 8.9  AST 25  ALT 16*  ALKPHOS 56  BILITOT 0.5   ------------------------------------------------------------------------------------------------------------------ estimated creatinine clearance is 43.3 mL/min (by C-G formula based on Cr of  1.77). ------------------------------------------------------------------------------------------------------------------ No results for input(s): HGBA1C in the last 72 hours. ------------------------------------------------------------------------------------------------------------------ No results for input(s): CHOL, HDL, LDLCALC, TRIG, CHOLHDL, LDLDIRECT in the last 72 hours. ------------------------------------------------------------------------------------------------------------------ No results for input(s): TSH, T4TOTAL, T3FREE, THYROIDAB in the last 72 hours.  Invalid input(s): FREET3 ------------------------------------------------------------------------------------------------------------------ No results for input(s): VITAMINB12, FOLATE, FERRITIN, TIBC, IRON, RETICCTPCT in the last 72 hours.  Coagulation profile  Recent Labs Lab 12/20/14 1121  INR 7.52*    No results for input(s): DDIMER in the last 72 hours.  Cardiac Enzymes No results for input(s): CKMB, TROPONINI, MYOGLOBIN in the last 168 hours.  Invalid input(s): CK ------------------------------------------------------------------------------------------------------------------ Invalid input(s): POCBNP   CBG:  Recent Labs Lab 12/20/14 1049  GLUCAP 120*       EKG: Independently reviewed. Showing atrial fibrillation   Assessment/Plan Principal Problem:  GI bleed: Acute. Secondary to a supratherapeutic INR. Patient with associated history of adenocarcinoma of the gastric antrum. Patient with a elevated BUN Patient transfused 2 packs of pRBCs. Received 2 L of IV fluids -Gastroenterology consultation called out to Dr. Brien Mates -Protonix gtt  Acute blood loss anemia: Initial hemoglobin is 12.8 transfused 2 units of blood in the ED. - H&H every 4 hours -Repeat BMP in a.m. -Check ionized calcium in a.m. -Monitor for signs of fluid overload  Atrial Fibrillation: Patient was on Coumadin. INR was 7.2  admission. EKG showing atrial fibrillation. Heart rate is well controlled. With the patient recently having a GI bleed we will have to consider risk and benefits of anticoagulation -Diltiazem IV prn if needed for heart rates greater than 110 and SBP >100  until able to restart home meds. - Goal for rate control heart rate <110  Long term current use of anticoagulant therapy: Uncontrolled. supratherapeutic INR at 7.2 on initial presentation patient was given Eppie Gibson  -[facility] protocol -F/u INR TxN1 adenocarcinoma of the gastric antrum:  Diabetes mellitus type II, non insulin dependent ([place]): -Held patient's glipizide and metformin will need to be restarted at discharge -Accu-Cheks every 4 hours with sliding scale insulin sensitivity scale -Hypoglycemic protocols  Essential hypertension: Stable. Patient initially presented with low normal blood pressure. -Will need to be restarted on home blood pressure medications when able to take by mouth medications  Adenocarcinoma of the gastric antrum on chemotherapy: -Held all meds will need to be restarted when patient taking by mouth medications  AKI: Baseline Cr 1.2 acutely worsen Cr 1.77 on admission. Likely due to prerenal secondary to severe dehydration. - IVF of NS at 150/hr -Follow up renal function by BMP -Avoid ACEI  and NSAIDs   Lactic acidosis: Lactic acid level 2.61 secondary to acute blood loss -recheck lactic acidosis in am.  DVT prophylaxis: SCDs secondary to active GI bleed Code Status:   full Family Communication: none at bedside Disposition Plan: admit   Total time spent 55 minutes.Greater than 50% of this time was spent in counseling, explanation of diagnosis, planning of further management, and coordination of care  [facility] Pager [phone]  If 7PM-7AM, please contact night-coverage [email or web address] Password [email or web address] 12/20/2014, 3:53 PM

## 2014-12-20 NOTE — Progress Notes (Signed)
Report given to [facility], RN and is aware that  Patient will need blood.

## 2014-12-20 NOTE — Progress Notes (Signed)
Received patient from ED. Oriented  to room.  CHG bath completed. MRSA swab obtained. Aware that patient will need 1 more unit of blood.

## 2014-12-20 NOTE — Progress Notes (Signed)
Report obtained from [place].

## 2014-12-20 NOTE — ED Notes (Signed)
Report attempted 

## 2014-12-20 NOTE — ED Notes (Signed)
   Critical INR received from lab: 7.52

## 2014-12-20 NOTE — ED Notes (Signed)
hospitalist at bedside

## 2014-12-21 LAB — CBC
HCT: 26.4 % — ABNORMAL LOW (ref 39.0–52.0)
HEMATOCRIT: 26.5 % — AB (ref 39.0–52.0)
HEMOGLOBIN: 8.9 g/dL — AB (ref 13.0–17.0)
HEMOGLOBIN: 9.1 g/dL — AB (ref 13.0–17.0)
MCH: 28.2 pg (ref 26.0–34.0)
MCH: 28.4 pg (ref 26.0–34.0)
MCHC: 33.7 g/dL (ref 30.0–36.0)
MCHC: 34.3 g/dL (ref 30.0–36.0)
MCV: 83.5 fL (ref 78.0–100.0)
MCV: 82.8 fL (ref 78.0–100.0)
Platelets: 196 10*3/uL (ref 150–400)
Platelets: 190 10*3/uL (ref 150–400)
RBC: 3.16 MIL/uL — ABNORMAL LOW (ref 4.22–5.81)
RBC: 3.2 MIL/uL — AB (ref 4.22–5.81)
RDW: 18.8 % — ABNORMAL HIGH (ref 11.5–15.5)
RDW: 18.9 % — ABNORMAL HIGH (ref 11.5–15.5)
WBC: 4.8 10*3/uL (ref 4.0–10.5)
WBC: 5.7 10*3/uL (ref 4.0–10.5)

## 2014-12-21 LAB — BASIC METABOLIC PANEL
ANION GAP: 7 (ref 5–15)
BUN: 34 mg/dL — ABNORMAL HIGH (ref 6–20)
CHLORIDE: 108 mmol/L (ref 101–111)
CO2: 19 mmol/L — AB (ref 22–32)
CREATININE: 1.01 mg/dL (ref 0.61–1.24)
Calcium: 7.9 mg/dL — ABNORMAL LOW (ref 8.9–10.3)
GFR calc non Af Amer: >60 mL/min (ref >60)
GLUCOSE: 40 mg/dL — AB (ref 65–99)
Potassium: 4.1 mmol/L (ref 3.5–5.1)
Sodium: 134 mmol/L — ABNORMAL LOW (ref 135–145)

## 2014-12-21 LAB — GLUCOSE, CAPILLARY
GLUCOSE-CAPILLARY: 73 mg/dL (ref 65–99)
Glucose-Capillary: 84 mg/dL (ref 65–99)
Glucose-Capillary: 56 mg/dL — ABNORMAL LOW (ref 65–99)
Glucose-Capillary: 104 mg/dL — ABNORMAL HIGH (ref 65–99)

## 2014-12-21 LAB — HEMOGLOBIN: Hemoglobin: 8.6 g/dL — ABNORMAL LOW (ref 13.0–17.0)

## 2014-12-21 LAB — PROTIME-INR
INR: 1.15 (ref 0.00–1.49)
Prothrombin Time: 14.8 seconds (ref 11.6–15.2)

## 2014-12-21 MED ORDER — DEXTROSE 50 % IV SOLN
INTRAVENOUS | Status: AC
  Administered 2014-12-21: 50 mL
  Filled 2014-12-21: qty 50

## 2014-12-21 MED ORDER — CAPECITABINE 500 MG PO TABS: 1000.0000 mg | ORAL_TABLET | Freq: Two times a day (BID) | ORAL | Status: DC

## 2014-12-21 MED ORDER — DEXTROSE-NACL 5-0.9 % IV SOLN
INTRAVENOUS | Status: DC
  Administered 2014-12-21 – 2014-12-22 (×4): via INTRAVENOUS

## 2014-12-21 MED ORDER — FERROUS SULFATE 325 (65 FE) MG PO TABS
325.0000 mg | ORAL_TABLET | Freq: Two times a day (BID) | ORAL | Status: DC
  Administered 2014-12-21 – 2014-12-24 (×6): 325 mg via ORAL
  Filled 2014-12-21 (×7): qty 1

## 2014-12-21 MED ORDER — CARVEDILOL 6.25 MG PO TABS: 6.2500 mg | ORAL_TABLET | Freq: Two times a day (BID) | ORAL | Status: DC

## 2014-12-21 MED ORDER — DEXTROSE 50 % IV SOLN
INTRAVENOUS | Status: AC
Start: 2014-12-21 — End: 2014-12-21
  Administered 2014-12-21: 50 mL
  Filled 2014-12-21: qty 50

## 2014-12-21 MED ORDER — CARVEDILOL 3.125 MG PO TABS
3.1250 mg | ORAL_TABLET | Freq: Two times a day (BID) | ORAL | Status: DC
  Administered 2014-12-21 – 2014-12-24 (×6): 3.125 mg via ORAL
  Filled 2014-12-21 (×7): qty 1

## 2014-12-21 MED ORDER — ATORVASTATIN CALCIUM 10 MG PO TABS
10.0000 mg | ORAL_TABLET | Freq: Every day | ORAL | Status: DC
  Administered 2014-12-21 – 2014-12-23 (×3): 10 mg via ORAL
  Filled 2014-12-21 (×4): qty 1

## 2014-12-21 MED ORDER — CAPECITABINE 500 MG PO TABS
1000.0000 mg | ORAL_TABLET | Freq: Two times a day (BID) | ORAL | Status: DC
  Administered 2014-12-21 – 2014-12-24 (×6): 1000 mg via ORAL
  Filled 2014-12-21 (×11): qty 2

## 2014-12-21 NOTE — Care Management Note (Signed)
Case Management Note  Patient Details  Name: Patrick [name] MRN: [id number] Date of Birth: 1945-11-22  Subjective/Objective:    Adm w gi bleed                Action/Plan: lives at home   Expected Discharge Date:                  Expected Discharge Plan:     In-House Referral:     Discharge planning Services     Post Acute Care Choice:    Choice offered to:     DME Arranged:    DME Agency:     HH Arranged:    [place] Agency:     Status of Service:     Medicare Important Message Given:    Date Medicare IM Given:    Medicare IM give by:    Date Additional Medicare IM Given:    Additional Medicare Important Message give by:     If discussed at [place] of Stay Meetings, dates discussed:    Additional Comments: ur review done  Lacretia Leigh, RN 12/21/2014, 8:19 AM

## 2014-12-21 NOTE — Progress Notes (Signed)
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 Patient was at church singing in the choir when he got extremely weak and had to sit down. Members from the church persuaded him to come to the hospital. INR was noted to be 7.5 and his hemoglobin was 5.8. Patient admits to having dark stools for 3 weeks as well as increased weakness and fatigue over 1 week. He notes that his last INR was 2.7 on 11/16/2014.   HPI/Subjective: The patient is resting comfortably in his bed.  He is very hungry and asls to be allowed to eat.  He is not experiencing any nausea vomiting abdominal pain chest pain or shortness of breath today.  He has not moved his bowels since he was admitted to the hospital.  Assessment/Plan:  Upper GI bleed / melena  Likely due to his known gastric cancer in the setting of coagulopathy related to supratherapeutic INR on Coumadin - with correction of INR and appears his bleeding has ceased - will hold on GI consult at this time unless bleeding persists/returns despite corrected INR  Acute blood loss anemia Status post 4 units PRBC thus far - with correction of INR bleeding appears to have ceased - follow hemoglobin trend  Atrial Fibrillation Heart rate currently well controlled - holding anticoagulation at present given acute GI bleed  TxN1 adenocarcinoma of the gastric antrum Actively under the care of Dr. Alen Blew - resume outpatient oral chemotherapy regimen  Diabetes mellitus type II Hypoglycemia has been an issue due to nothing by mouth status - resume  diet as patient now appears to be stabilizing - follow CBG  Essential hypertension Blood pressure well controlled  AKI Baseline Cr 1.2 - 1.77 on admission - has returned to normal with volume expansion  Lactic acidosis secondary to acute blood loss - recheck lactic acidosis in am  Code Status: FULL Family Communication: no family present at time of exam Disposition Plan: SDU   Consultants: None  Procedures: None  Antibiotics: None  DVT prophylaxis: SCDs  Objective: Blood pressure 123/62, pulse 91, temperature 98.1 F (36.7 C), temperature source Oral, resp. rate 17, SpO2 100 %.  Intake/Output Summary (Last 24 hours) at 12/21/14 1600 Last data filed at 12/21/14 1400  Gross per 24 hour  Intake 3677.17 ml  Output   3500 ml  Net 177.17 ml   Exam: General: No acute respiratory distress Lungs: Clear to auscultation bilaterally without wheezes or crackles Cardiovascular: Regular rate and rhythm without murmur gallop or rub normal S1 and S2 Abdomen: Nontender, nondistended, soft, bowel sounds positive, no rebound, no ascites, no appreciable mass Extremities: No significant cyanosis, clubbing, or edema bilateral lower extremities  Data Reviewed: Basic Metabolic Panel:  Recent Labs Lab 12/20/14 1137 12/20/14 1816 12/21/14 0636  NA 136 137 134*  K 5.1 4.7 4.1  CL 106 113* 108  CO2 21* 17* 19*  GLUCOSE 150* 49* 40*  BUN 64* 51* 34*  CREATININE 1.77* 1.20 1.01  CALCIUM 8.9 8.0* 7.9*    CBC:  Recent Labs Lab 12/20/14 1121 12/20/14 1816 12/20/14 2133 12/21/14 0636 12/21/14 1120  WBC 5.7  --   --   --  4.8  NEUTROABS 4.6  --   --   --   --   HGB 5.8* 6.4* 6.7* 8.6* 8.9*  HCT 18.2* 19.9*  --   --  26.4*  MCV 83.9  --   --   --  83.5  PLT 296  --   --   --  196    Liver Function Tests:  Recent Labs Lab 12/20/14 1137  AST 25  ALT 16*  ALKPHOS 56  BILITOT 0.5  PROT 6.4*  ALBUMIN 3.5   Coags:  Recent Labs Lab 12/20/14 1121 12/20/14 1605  12/20/14 1958 12/21/14 0636  INR 7.52* 1.20 1.23 1.15    Recent Labs Lab 12/20/14 1605  APTT 32   CBG:  Recent Labs Lab 12/20/14 1049 12/21/14 0920 12/21/14 1218 12/21/14 1403  GLUCAP 120* 84 56* 104*    Recent Results (from the past 240 hour(s))  Culture, blood (routine x 2)     Status: None (Preliminary result)   Collection Time: 12/20/14 11:50 AM  Result Value Ref Range Status   Specimen Description BLOOD RIGHT HAND  Final   Special Requests BOTTLES DRAWN AEROBIC ONLY 10CC  Final   Culture NO GROWTH 1 DAY  Final   Report Status PENDING  Incomplete  Culture, blood (routine x 2)     Status: None (Preliminary result)   Collection Time: 12/20/14 12:20 PM  Result Value Ref Range Status   Specimen Description BLOOD RIGHT HAND  Final   Special Requests BOTTLES DRAWN AEROBIC ONLY 10CC  Final   Culture NO GROWTH 1 DAY  Final   Report Status PENDING  Incomplete  MRSA PCR Screening     Status: None   Collection Time: 12/20/14  6:08 PM  Result Value Ref Range Status   MRSA by PCR NEGATIVE NEGATIVE Final    Comment:        The GeneXpert MRSA Assay (FDA approved for NASAL specimens only), is one component of a comprehensive MRSA colonization surveillance program. It is not intended to diagnose MRSA infection nor to guide or monitor treatment for MRSA infections.      Studies:   Recent x-ray studies have been reviewed in detail by the Attending Physician  Scheduled Meds:  Scheduled Meds: . sodium chloride   Intravenous Once  . sodium chloride  3 mL Intravenous Q12H    Time spent on care of this patient: 35 mins   Joeleen Wortley T , MD   [facility] Office  [phone] Pager - Text Page per Shea Evans as per below:  On-Call/Text Page:      [email or web address]      password [email or web address]  If 7PM-7AM, please contact night-coverage [email or web address] Password [email or web address] 12/21/2014, 4:00 PM   LOS: 1 day

## 2014-12-22 DIAGNOSIS — Z7901 Long term (current) use of anticoagulants: Secondary | ICD-10-CM

## 2014-12-22 DIAGNOSIS — C163 Malignant neoplasm of pyloric antrum: Secondary | ICD-10-CM

## 2014-12-22 DIAGNOSIS — D689 Coagulation defect, unspecified: Secondary | ICD-10-CM | POA: Diagnosis present

## 2014-12-22 DIAGNOSIS — E119 Type 2 diabetes mellitus without complications: Secondary | ICD-10-CM

## 2014-12-22 DIAGNOSIS — D62 Acute posthemorrhagic anemia: Secondary | ICD-10-CM

## 2014-12-22 DIAGNOSIS — K922 Gastrointestinal hemorrhage, unspecified: Secondary | ICD-10-CM

## 2014-12-22 DIAGNOSIS — R791 Abnormal coagulation profile: Secondary | ICD-10-CM

## 2014-12-22 DIAGNOSIS — I481 Persistent atrial fibrillation: Secondary | ICD-10-CM

## 2014-12-22 DIAGNOSIS — I10 Essential (primary) hypertension: Secondary | ICD-10-CM

## 2014-12-22 LAB — TYPE AND SCREEN
ABO/RH(D): A POS
Antibody Screen: NEGATIVE
Unit division: 0
Unit division: 0
Unit division: 0
Unit division: 0

## 2014-12-22 LAB — GLUCOSE, CAPILLARY
GLUCOSE-CAPILLARY: 129 mg/dL — AB (ref 65–99)
GLUCOSE-CAPILLARY: 184 mg/dL — AB (ref 65–99)
GLUCOSE-CAPILLARY: 250 mg/dL — AB (ref 65–99)
Glucose-Capillary: 163 mg/dL — ABNORMAL HIGH (ref 65–99)

## 2014-12-22 LAB — PROTIME-INR
INR: 1.16 (ref 0.00–1.49)
PROTHROMBIN TIME: 15 s (ref 11.6–15.2)

## 2014-12-22 LAB — COMPREHENSIVE METABOLIC PANEL
ALT: 12 U/L — ABNORMAL LOW (ref 17–63)
ANION GAP: 7 (ref 5–15)
AST: 19 U/L (ref 15–41)
Albumin: 3 g/dL — ABNORMAL LOW (ref 3.5–5.0)
Alkaline Phosphatase: 51 U/L (ref 38–126)
BUN: 14 mg/dL (ref 6–20)
CHLORIDE: 110 mmol/L (ref 101–111)
CO2: 18 mmol/L — AB (ref 22–32)
Calcium: 8 mg/dL — ABNORMAL LOW (ref 8.9–10.3)
Creatinine, Ser: 0.88 mg/dL (ref 0.61–1.24)
Glucose, Bld: 171 mg/dL — ABNORMAL HIGH (ref 65–99)
Potassium: 4.1 mmol/L (ref 3.5–5.1)
SODIUM: 135 mmol/L (ref 135–145)
Total Bilirubin: 0.3 mg/dL (ref 0.3–1.2)
Total Protein: 5.4 g/dL — ABNORMAL LOW (ref 6.5–8.1)

## 2014-12-22 LAB — CBC
HCT: 29.6 % — ABNORMAL LOW (ref 39.0–52.0)
HEMOGLOBIN: 9.7 g/dL — AB (ref 13.0–17.0)
MCH: 28.1 pg (ref 26.0–34.0)
MCHC: 32.8 g/dL (ref 30.0–36.0)
MCV: 85.8 fL (ref 78.0–100.0)
PLATELETS: 206 10*3/uL (ref 150–400)
RBC: 3.45 MIL/uL — AB (ref 4.22–5.81)
RDW: 19.6 % — ABNORMAL HIGH (ref 11.5–15.5)
WBC: 5.5 10*3/uL (ref 4.0–10.5)

## 2014-12-22 LAB — LACTIC ACID, PLASMA
LACTIC ACID, VENOUS: 2.2 mmol/L — AB (ref 0.5–2.0)
LACTIC ACID, VENOUS: 2.1 mmol/L — AB (ref 0.5–2.0)

## 2014-12-22 MED ORDER — PANTOPRAZOLE SODIUM 40 MG PO TBEC
40.0000 mg | DELAYED_RELEASE_TABLET | Freq: Every day | ORAL | Status: DC
  Administered 2014-12-22 – 2014-12-24 (×3): 40 mg via ORAL
  Filled 2014-12-22 (×3): qty 1

## 2014-12-22 MED ORDER — INSULIN ASPART 100 UNIT/ML ~~LOC~~ SOLN
0.0000 [IU] | SUBCUTANEOUS | Status: DC
  Administered 2014-12-22: 3 [IU] via SUBCUTANEOUS
  Administered 2014-12-23: 1 [IU] via SUBCUTANEOUS
  Administered 2014-12-23: 2 [IU] via SUBCUTANEOUS

## 2014-12-22 MED ORDER — SODIUM CHLORIDE 0.9 % IV BOLUS (SEPSIS)
500.0000 mL | Freq: Once | INTRAVENOUS | Status: AC
  Administered 2014-12-22: 500 mL via INTRAVENOUS

## 2014-12-22 MED ORDER — INFLUENZA VAC SPLIT QUAD 0.5 ML IM SUSY
0.5000 mL | PREFILLED_SYRINGE | INTRAMUSCULAR | Status: AC
  Administered 2014-12-22: 0.5 mL via INTRAMUSCULAR

## 2014-12-22 NOTE — Progress Notes (Signed)
CRITICAL VALUE ALERT  Critical value received:  Lactic acid 2.2  Date of notification:  12/22/14  Time of notification:  18:37  Critical value read back:Yes.    Nurse who received alert:  Srihith Spires RN  MD notified (1st page):  Dr Sherral Hammers   Time of first page:  18:38  MD notified (2nd page): Dr Sherral Hammers   Time of second page: 18:53  Responding MD:  Dr Sherral Hammers     Time MD responded:  18:56

## 2014-12-22 NOTE — Progress Notes (Signed)
CRITICAL VALUE ALERT  Critical value received:  LA 2.1  Date of notification:  12/22/2014   Time of notification:  9:39 PM   Critical value read back:Yes.    Nurse who received alert:  Monico Hoar, RN   MD notified (1st page):  Baltazar Najjar, NP   Time of first page:  9:39 PM    Responding MD:  Baltazar Najjar, NP    Time MD responded:  9:39 PM

## 2014-12-22 NOTE — Progress Notes (Signed)
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 Patient was at church singing in the choir when he got extremely weak and had to sit down. Members from the church persuaded him to come to the hospital. INR was noted to be 7.5 and his hemoglobin was 5.8. Patient admits to having dark stools for 3 weeks as well as increased weakness and fatigue over 1 week. He notes that his last INR was 2.7 on 11/16/2014.    HPI/Subjective: 10/18 A/O 4, NAD,  Assessment/Plan: Upper GI bleed / melena  -Likely due to his known gastric cancer in the setting of coagulopathy related to supratherapeutic INR on Coumadin - with correction of INR and appears his bleeding has ceased - will hold on GI consult at this time unless bleeding persists/returns despite corrected INR -Protonic 40 mg daily  Acute blood loss anemia -Status post transfused 4 units PRBC thus far -Stable  Lactic acidosis -Most likely secondary to gastric cancer/GI bleed, however will obtain blood and urine cultures. -Patient afebrile, negative leukocytosis, negative bands, negative left shift however patient is immunocompromised secondary to chemotherapy -Trend lactic acid -Bolus normal saline 540ml  Atrial Fibrillation -Heart rate currently well controlled - holding anticoagulation at present given acute GI bleed  Dilated cardiomyopathy  -See echocardiogram from 09/15/2014 -Repeat echocardiogram pending  TxN1 adenocarcinoma of the gastric antrum -Actively under the care of Dr. Alen Blew - resume outpatient oral chemotherapy regimen  Diabetes mellitus type  II -Sensitive SSI  Essential hypertension Blood pressure well controlled  AKI Baseline Cr 1.2 - 1.77 on admission - has returned to normal with volume expansion    Code Status: FULL Family Communication: no family present at time of exam Disposition Plan: Discharge in next 24-48 hours    Consultants: NA  Procedure/Significant Events: NA   Culture NA  Antibiotics: NA  DVT prophylaxis: SCD   Devices    LINES / TUBES:      Continuous Infusions: . dextrose 5 % and 0.9% NaCl 75 mL/hr at 12/22/14 0428  . pantoprozole (PROTONIX) infusion 8 mg/hr (12/22/14 0439)    Objective: VITAL SIGNS: Temp: 97.9 F (36.6 C) (10/18 0415) Temp Source: Oral (10/18 0415) BP: 102/59 mmHg (10/18 0500) Pulse Rate: 82 (10/18 0500) SPO2; FIO2:   Intake/Output Summary (Last 24 hours) at 12/22/14 0748 Last data filed at 12/22/14 0600  Gross per 24 hour  Intake   2648 ml  Output   3450 ml  Net   -802 ml     Exam: General: A/O 4, NAD, No acute respiratory distress Eyes: Negative headache, eye pain, double vision,negative scleral hemorrhage ENT: Negative Runny nose, negative ear pain, negative gingival bleeding, Neck:  Negative scars, masses, torticollis, lymphadenopathy, JVD Lungs: Clear to auscultation bilaterally without wheezes or crackles Cardiovascular: Irregular irregular rhythm and rate, without murmur gallop or rub normal S1 and S2 Abdomen:negative abdominal pain, nondistended, positive soft, bowel sounds, no rebound, no ascites, no appreciable mass Extremities: No significant cyanosis, clubbing, or edema bilateral lower extremities Psychiatric:  Negative depression, negative anxiety, negative fatigue, negative mania  Neurologic:  Cranial nerves II through XII intact, tongue/uvula midline, all extremities muscle strength 5/5, sensation intact throughout, negative dysarthria, negative expressive aphasia,  negative receptive aphasia.   Data Reviewed: Basic  Metabolic Panel:  Recent Labs Lab 12/20/14 1137 12/20/14 1816 12/21/14 0636 12/22/14 0326  NA 136 137 134* 135  K 5.1 4.7 4.1 4.1  CL 106 113* 108 110  CO2 21* 17* 19* 18*  GLUCOSE 150* 49* 40* 171*  BUN 64* 51* 34* 14  CREATININE 1.77* 1.20 1.01 0.88  CALCIUM 8.9 8.0* 7.9* 8.0*   Liver Function Tests:  Recent Labs Lab 12/20/14 1137 12/22/14 0326  AST 25 19  ALT 16* 12*  ALKPHOS 56 51  BILITOT 0.5 0.3  PROT 6.4* 5.4*  ALBUMIN 3.5 3.0*   No results for input(s): LIPASE, AMYLASE in the last 168 hours. No results for input(s): AMMONIA in the last 168 hours. CBC:  Recent Labs Lab 12/20/14 1121 12/20/14 1816 12/20/14 2133 12/21/14 0636 12/21/14 1120 12/21/14 1844 12/22/14 0326  WBC 5.7  --   --   --  4.8 5.7 5.5  NEUTROABS 4.6  --   --   --   --   --   --   HGB 5.8* 6.4* 6.7* 8.6* 8.9* 9.1* 9.7*  HCT 18.2* 19.9*  --   --  26.4* 26.5* 29.6*  MCV 83.9  --   --   --  83.5 82.8 85.8  PLT 296  --   --   --  196 190 206   Cardiac Enzymes: No results for input(s): CKTOTAL, CKMB, CKMBINDEX, TROPONINI in the last 168 hours. BNP (last 3 results) No results for input(s): BNP in the last 8760 hours.  ProBNP (last 3 results) No results for input(s): PROBNP in the last 8760 hours.  CBG:  Recent Labs Lab 12/20/14 1049 12/21/14 0920 12/21/14 1218 12/21/14 1403 12/21/14 1648  GLUCAP 120* 84 56* 104* 73    Recent Results (from the past 240 hour(s))  Culture, blood (routine x 2)     Status: None (Preliminary result)   Collection Time: 12/20/14 11:50 AM  Result Value Ref Range Status   Specimen Description BLOOD RIGHT HAND  Final   Special Requests BOTTLES DRAWN AEROBIC ONLY 10CC  Final   Culture NO GROWTH 1 DAY  Final   Report Status PENDING  Incomplete  Culture, blood (routine x 2)     Status: None (Preliminary result)   Collection Time: 12/20/14 12:20 PM  Result Value Ref Range Status   Specimen Description BLOOD RIGHT HAND  Final   Special Requests BOTTLES  DRAWN AEROBIC ONLY 10CC  Final   Culture NO GROWTH 1 DAY  Final   Report Status PENDING  Incomplete  MRSA PCR Screening     Status: None   Collection Time: 12/20/14  6:08 PM  Result Value Ref Range Status   MRSA by PCR NEGATIVE NEGATIVE Final    Comment:        The GeneXpert MRSA Assay (FDA approved for NASAL specimens only), is one component of a comprehensive MRSA colonization surveillance program. It is not intended to diagnose MRSA infection nor to guide or monitor treatment for MRSA infections.      Studies:  Recent x-ray studies have been reviewed in detail by the Attending Physician  Scheduled Meds:  Scheduled Meds: . atorvastatin  10 mg Oral q1800  . capecitabine  1,000 mg Oral BID PC  . [START ON 01/04/2015] capecitabine  1,000 mg Oral BID PC  . carvedilol  3.125 mg Oral BID WC  . ferrous sulfate  325 mg Oral BID WC  . sodium chloride  3 mL Intravenous Q12H    Time spent on care of this patient: 40 mins   Novia Lansberry, Geraldo Docker , MD  [facility] Office  [phone] Pager [phone]  On-Call/Text Page:      [email or web address]      password [email or web address]  If 7PM-7AM, please contact night-coverage [email or web address] Password [email or web address] 12/22/2014, 7:48 AM   LOS: 2 days   Care during the described time interval was provided by me .  I have reviewed this patient's available data, including medical history, events of note, physical examination, and all test results as part of my evaluation. I have personally reviewed and interpreted all radiology studies.   Dia Crawford, MD [phone] Pager

## 2014-12-23 ENCOUNTER — Ambulatory Visit (HOSPITAL_COMMUNITY): Payer: Medicare Other

## 2014-12-23 DIAGNOSIS — I420 Dilated cardiomyopathy: Secondary | ICD-10-CM | POA: Diagnosis not present

## 2014-12-23 DIAGNOSIS — K922 Gastrointestinal hemorrhage, unspecified: Secondary | ICD-10-CM | POA: Diagnosis not present

## 2014-12-23 DIAGNOSIS — E11649 Type 2 diabetes mellitus with hypoglycemia without coma: Secondary | ICD-10-CM | POA: Diagnosis not present

## 2014-12-23 DIAGNOSIS — Z7901 Long term (current) use of anticoagulants: Secondary | ICD-10-CM | POA: Diagnosis not present

## 2014-12-23 DIAGNOSIS — E860 Dehydration: Secondary | ICD-10-CM | POA: Diagnosis not present

## 2014-12-23 DIAGNOSIS — T45515A Adverse effect of anticoagulants, initial encounter: Secondary | ICD-10-CM | POA: Diagnosis not present

## 2014-12-23 DIAGNOSIS — I959 Hypotension, unspecified: Secondary | ICD-10-CM | POA: Diagnosis not present

## 2014-12-23 DIAGNOSIS — Z87891 Personal history of nicotine dependence: Secondary | ICD-10-CM | POA: Diagnosis not present

## 2014-12-23 DIAGNOSIS — Z7984 Long term (current) use of oral hypoglycemic drugs: Secondary | ICD-10-CM | POA: Diagnosis not present

## 2014-12-23 DIAGNOSIS — Z7982 Long term (current) use of aspirin: Secondary | ICD-10-CM | POA: Diagnosis not present

## 2014-12-23 DIAGNOSIS — D62 Acute posthemorrhagic anemia: Secondary | ICD-10-CM | POA: Diagnosis not present

## 2014-12-23 DIAGNOSIS — I272 Other secondary pulmonary hypertension: Secondary | ICD-10-CM | POA: Diagnosis not present

## 2014-12-23 DIAGNOSIS — I10 Essential (primary) hypertension: Secondary | ICD-10-CM | POA: Diagnosis not present

## 2014-12-23 DIAGNOSIS — N179 Acute kidney failure, unspecified: Secondary | ICD-10-CM | POA: Diagnosis not present

## 2014-12-23 DIAGNOSIS — I425 Other restrictive cardiomyopathy: Secondary | ICD-10-CM

## 2014-12-23 DIAGNOSIS — E785 Hyperlipidemia, unspecified: Secondary | ICD-10-CM | POA: Diagnosis not present

## 2014-12-23 DIAGNOSIS — E872 Acidosis: Secondary | ICD-10-CM | POA: Diagnosis not present

## 2014-12-23 DIAGNOSIS — D6832 Hemorrhagic disorder due to extrinsic circulating anticoagulants: Secondary | ICD-10-CM | POA: Diagnosis not present

## 2014-12-23 DIAGNOSIS — C163 Malignant neoplasm of pyloric antrum: Secondary | ICD-10-CM | POA: Diagnosis not present

## 2014-12-23 DIAGNOSIS — Z79899 Other long term (current) drug therapy: Secondary | ICD-10-CM | POA: Diagnosis not present

## 2014-12-23 DIAGNOSIS — I481 Persistent atrial fibrillation: Secondary | ICD-10-CM | POA: Diagnosis not present

## 2014-12-23 LAB — CBC WITH DIFFERENTIAL/PLATELET
BASOS PCT: 0 %
Basophils Absolute: 0 10*3/uL (ref 0.0–0.1)
Eosinophils Absolute: 0.2 10*3/uL (ref 0.0–0.7)
Eosinophils Relative: 3 %
HEMATOCRIT: 25.2 % — AB (ref 39.0–52.0)
HEMOGLOBIN: 8.4 g/dL — AB (ref 13.0–17.0)
LYMPHS ABS: 0.7 10*3/uL (ref 0.7–4.0)
Lymphocytes Relative: 13 %
MCH: 28.1 pg (ref 26.0–34.0)
MCHC: 33.3 g/dL (ref 30.0–36.0)
MCV: 84.3 fL (ref 78.0–100.0)
MONOS PCT: 7 %
Monocytes Absolute: 0.3 10*3/uL (ref 0.1–1.0)
NEUTROS ABS: 3.9 10*3/uL (ref 1.7–7.7)
NEUTROS PCT: 77 %
Platelets: 210 10*3/uL (ref 150–400)
RBC: 2.99 MIL/uL — AB (ref 4.22–5.81)
RDW: 19.4 % — ABNORMAL HIGH (ref 11.5–15.5)
WBC: 5 10*3/uL (ref 4.0–10.5)

## 2014-12-23 LAB — CBC
HEMATOCRIT: 29.8 % — AB (ref 39.0–52.0)
Hemoglobin: 10.1 g/dL — ABNORMAL LOW (ref 13.0–17.0)
MCH: 28.8 pg (ref 26.0–34.0)
MCHC: 33.9 g/dL (ref 30.0–36.0)
MCV: 84.9 fL (ref 78.0–100.0)
PLATELETS: UNDETERMINED 10*3/uL (ref 150–400)
RBC: 3.51 MIL/uL — ABNORMAL LOW (ref 4.22–5.81)
RDW: 19.8 % — AB (ref 11.5–15.5)
WBC: 7.9 10*3/uL (ref 4.0–10.5)

## 2014-12-23 LAB — COMPREHENSIVE METABOLIC PANEL
ALT: 13 U/L — AB (ref 17–63)
AST: 18 U/L (ref 15–41)
Albumin: 2.9 g/dL — ABNORMAL LOW (ref 3.5–5.0)
Alkaline Phosphatase: 48 U/L (ref 38–126)
Anion gap: 8 (ref 5–15)
BILIRUBIN TOTAL: 0.5 mg/dL (ref 0.3–1.2)
BUN: 11 mg/dL (ref 6–20)
CO2: 21 mmol/L — ABNORMAL LOW (ref 22–32)
CREATININE: 0.95 mg/dL (ref 0.61–1.24)
Calcium: 8.6 mg/dL — ABNORMAL LOW (ref 8.9–10.3)
Chloride: 110 mmol/L (ref 101–111)
GFR calc Af Amer: >60 mL/min (ref >60)
Glucose, Bld: 90 mg/dL (ref 65–99)
POTASSIUM: 4.1 mmol/L (ref 3.5–5.1)
Sodium: 139 mmol/L (ref 135–145)
TOTAL PROTEIN: 5.2 g/dL — AB (ref 6.5–8.1)

## 2014-12-23 LAB — GLUCOSE, CAPILLARY
GLUCOSE-CAPILLARY: 76 mg/dL (ref 65–99)
GLUCOSE-CAPILLARY: 95 mg/dL (ref 65–99)
GLUCOSE-CAPILLARY: 156 mg/dL — AB (ref 65–99)
GLUCOSE-CAPILLARY: 138 mg/dL — AB (ref 65–99)
Glucose-Capillary: 154 mg/dL — ABNORMAL HIGH (ref 65–99)
Glucose-Capillary: 239 mg/dL — ABNORMAL HIGH (ref 65–99)

## 2014-12-23 LAB — LACTIC ACID, PLASMA: Lactic Acid, Venous: 0.8 mmol/L (ref 0.5–2.0)

## 2014-12-23 LAB — MAGNESIUM: MAGNESIUM: 1.5 mg/dL — AB (ref 1.7–2.4)

## 2014-12-23 MED ORDER — INSULIN ASPART 100 UNIT/ML ~~LOC~~ SOLN
0.0000 [IU] | Freq: Three times a day (TID) | SUBCUTANEOUS | Status: DC
  Administered 2014-12-23: 2 [IU] via SUBCUTANEOUS
  Administered 2014-12-24: 3 [IU] via SUBCUTANEOUS

## 2014-12-23 NOTE — Care Management Important Message (Signed)
Important Message  Patient Details  Name: Patrick [name] MRN: [id number] Date of Birth: 1945/08/21   Medicare Important Message Given:  Yes-second notification given    Nathen May 12/23/2014, 10:06 AM

## 2014-12-23 NOTE — Progress Notes (Signed)
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 Patient was at church singing in the choir when he got extremely weak and had to sit down. Members from the church persuaded him to come to the hospital. INR was noted to be 7.5 and his hemoglobin was 5.8. Patient admits to having dark stools for 3 weeks as well as increased weakness and fatigue over 1 week. He notes that his last INR was 2.7 on 11/16/2014.   HPI/Subjective: The patient is resting comfortably in a bedside chair.  He had a dark stool this morning.  He denies abdominal cramps, nausea, vomiting, shortness of breath, or chest pain.  He has not yet been up and around his ribs to any significant extent.  Assessment/Plan:  Upper GI bleed / melena  Likely due to his known gastric cancer in the setting of coagulopathy related to supratherapeutic INR on Coumadin - with correction of INR it appears his bleeding has ceased - will hold on GI consult at this time unless bleeding persists/returns despite corrected INR  Acute blood loss anemia Status post 4 units PRBC this admit - with correction of INR bleeding appears to have ceased - follow hemoglobin trend   Atrial Fibrillation CHA2DS2-VASc score is at least 3 - Has Bled score presently 3, making risk of bleeding higher than risk of CVA presently - will not plan to resume anticoag for 7 days, and then only if Hgb remains stable - Heart rate currently well controlled   TxN1 adenocarcinoma of the gastric antrum Actively under the care of Dr. Alen Blew - resume  outpatient oral chemotherapy regimen  Diabetes mellitus type II CBGs currently well controlled   Essential hypertension Blood pressure well controlled  Hypomagnesemia  Replace today and recheck in AM   AKI Baseline Cr 1.2 - 1.77 on admission - has returned to normal with volume expansion  Lactic acidosis secondary to acute blood loss - has returned to normal with volume expansion  Code Status: FULL Family Communication: no family present at time of exam Disposition Plan: transfer to med bed - ambulate w/ PT/OT - recheck Hgb in AM - possible d/c home 10/20  Consultants: None  Procedures: TTE 10/19 - pending   Antibiotics: None  DVT prophylaxis: SCDs  Objective: Blood pressure 107/60, pulse 84, temperature 98 F (36.7 C), temperature source Oral, resp. rate 18, height 6' (1.829 m), weight 80.1 kg (176 lb 9.4 oz), SpO2 100 %.  Intake/Output Summary (Last 24 hours) at 12/23/14 1202 Last data filed at 12/23/14 1015  Gross per 24 hour  Intake    103 ml  Output   2125 ml  Net  -2022 ml   Exam: General: No acute respiratory distress Lungs: Clear to auscultation bilaterally without wheeze/crackles Cardiovascular: Regular rate without murmur gallop or rub  Abdomen: Nontender, nondistended, soft, bowel sounds positive, no rebound, no ascites, no appreciable mass Extremities: No significant cyanosis, clubbing, edema bilateral lower extremities  Data Reviewed: Basic Metabolic Panel:  Recent Labs Lab 12/20/14 1137 12/20/14 1816 12/21/14 0636 12/22/14 0326 12/23/14 0126  NA 136 137 134* 135 139  K 5.1 4.7 4.1 4.1 4.1  CL 106 113* 108 110 110  CO2 21* 17* 19* 18* 21*  GLUCOSE 150* 49* 40* 171* 90  BUN 64* 51* 34* 14 11  CREATININE 1.77* 1.20 1.01 0.88 0.95  CALCIUM 8.9 8.0* 7.9* 8.0* 8.6*  MG  --   --   --   --  1.5*    CBC:  Recent Labs Lab 12/20/14 1121  12/21/14 1120 12/21/14 1844 12/22/14 0326 12/23/14 0126 12/23/14 1030  WBC 5.7  --  4.8 5.7 5.5  5.0 PENDING  NEUTROABS 4.6  --   --   --   --  3.9  --   HGB 5.8*  < > 8.9* 9.1* 9.7* 8.4* 10.1*  HCT 18.2*  < > 26.4* 26.5* 29.6* 25.2* 29.8*  MCV 83.9  --  83.5 82.8 85.8 84.3 84.9  PLT 296  --  196 190 206 210 PENDING  < > = values in this interval not displayed.  Liver Function Tests:  Recent Labs Lab 12/20/14 1137 12/22/14 0326 12/23/14 0126  AST 25 19 18   ALT 16* 12* 13*  ALKPHOS 56 51 48  BILITOT 0.5 0.3 0.5  PROT 6.4* 5.4* 5.2*  ALBUMIN 3.5 3.0* 2.9*   Coags:  Recent Labs Lab 12/20/14 1121 12/20/14 1605 12/20/14 1958 12/21/14 0636 12/22/14 0326  INR 7.52* 1.20 1.23 1.15 1.16    Recent Labs Lab 12/20/14 1605  APTT 32   CBG:  Recent Labs Lab 12/22/14 1620 12/22/14 2011 12/22/14 2322 12/23/14 0457 12/23/14 0934  GLUCAP 184* 250* 76 95 156*    Recent Results (from the past 240 hour(s))  Culture, blood (routine x 2)     Status: None (Preliminary result)   Collection Time: 12/20/14 11:50 AM  Result Value Ref Range Status   Specimen Description BLOOD RIGHT HAND  Final   Special Requests BOTTLES DRAWN AEROBIC ONLY 10CC  Final   Culture NO GROWTH 2 DAYS  Final   Report Status PENDING  Incomplete  Culture, blood (routine x 2)     Status: None (Preliminary result)   Collection Time: 12/20/14 12:20 PM  Result Value Ref Range Status   Specimen Description BLOOD RIGHT HAND  Final   Special Requests BOTTLES DRAWN AEROBIC ONLY 10CC  Final   Culture NO GROWTH 2 DAYS  Final   Report Status PENDING  Incomplete  MRSA PCR Screening     Status: None   Collection Time: 12/20/14  6:08 PM  Result Value Ref Range Status   MRSA by PCR NEGATIVE NEGATIVE Final    Comment:        The GeneXpert MRSA Assay (FDA approved for NASAL specimens only), is one component of a comprehensive MRSA colonization surveillance program. It is not intended to diagnose MRSA infection nor to guide or monitor treatment for MRSA infections.      Studies:   Recent x-ray studies  have been reviewed in detail by the Attending Physician  Scheduled Meds:  Scheduled Meds: . atorvastatin  10 mg Oral q1800  . capecitabine  1,000 mg Oral BID PC  . [START ON 01/04/2015] capecitabine  1,000 mg Oral BID PC  . carvedilol  3.125 mg Oral BID WC  . ferrous sulfate  325 mg Oral BID WC  . insulin aspart  0-9 Units Subcutaneous 6 times per day  . pantoprazole  40 mg Oral Daily  . sodium chloride  3 mL Intravenous Q12H    Time spent on care of this patient: 35 mins   MCCLUNG,JEFFREY T , MD   [facility] Office  [phone] Pager - Text Page per Shea Evans as per below:  On-Call/Text Page:      [email or web address]      password [email or web address]  If 7PM-7AM, please contact night-coverage [email or web address] Password [email or web address] 12/23/2014, 12:02 PM   LOS: 3 days

## 2014-12-23 NOTE — Progress Notes (Signed)
Echocardiogram 2D Echocardiogram has been performed.  Tresa Res 12/23/2014, 9:58 AM

## 2014-12-24 ENCOUNTER — Other Ambulatory Visit: Payer: Self-pay | Admitting: *Deleted

## 2014-12-24 DIAGNOSIS — C163 Malignant neoplasm of pyloric antrum: Secondary | ICD-10-CM

## 2014-12-24 DIAGNOSIS — N179 Acute kidney failure, unspecified: Secondary | ICD-10-CM | POA: Diagnosis present

## 2014-12-24 LAB — CBC
HEMATOCRIT: 27.9 % — AB (ref 39.0–52.0)
HEMOGLOBIN: 9.1 g/dL — AB (ref 13.0–17.0)
MCH: 27.9 pg (ref 26.0–34.0)
MCHC: 32.6 g/dL (ref 30.0–36.0)
MCV: 85.6 fL (ref 78.0–100.0)
Platelets: 233 10*3/uL (ref 150–400)
RBC: 3.26 MIL/uL — ABNORMAL LOW (ref 4.22–5.81)
RDW: 19.7 % — ABNORMAL HIGH (ref 11.5–15.5)
WBC: 4.4 10*3/uL (ref 4.0–10.5)

## 2014-12-24 LAB — URINE CULTURE: Culture: NO GROWTH

## 2014-12-24 LAB — COMPREHENSIVE METABOLIC PANEL
ALK PHOS: 56 U/L (ref 38–126)
ALT: 12 U/L — AB (ref 17–63)
AST: 18 U/L (ref 15–41)
Albumin: 3 g/dL — ABNORMAL LOW (ref 3.5–5.0)
Anion gap: 7 (ref 5–15)
BUN: 8 mg/dL (ref 6–20)
CALCIUM: 8.9 mg/dL (ref 8.9–10.3)
CO2: 23 mmol/L (ref 22–32)
CREATININE: 0.85 mg/dL (ref 0.61–1.24)
Chloride: 108 mmol/L (ref 101–111)
GFR calc non Af Amer: >60 mL/min (ref >60)
GLUCOSE: 127 mg/dL — AB (ref 65–99)
Potassium: 4.1 mmol/L (ref 3.5–5.1)
SODIUM: 138 mmol/L (ref 135–145)
Total Bilirubin: 0.8 mg/dL (ref 0.3–1.2)
Total Protein: 5.5 g/dL — ABNORMAL LOW (ref 6.5–8.1)

## 2014-12-24 LAB — GLUCOSE, CAPILLARY
GLUCOSE-CAPILLARY: 164 mg/dL — AB (ref 65–99)
Glucose-Capillary: 132 mg/dL — ABNORMAL HIGH (ref 65–99)
Glucose-Capillary: 228 mg/dL — ABNORMAL HIGH (ref 65–99)

## 2014-12-24 LAB — MAGNESIUM: Magnesium: 1.5 mg/dL — ABNORMAL LOW (ref 1.7–2.4)

## 2014-12-24 MED ORDER — ENOXAPARIN SODIUM 80 MG/0.8ML ~~LOC~~ SOLN
1.0000 mg/kg | Freq: Two times a day (BID) | SUBCUTANEOUS | Status: DC
  Administered 2014-12-24: 80 mg via SUBCUTANEOUS
  Filled 2014-12-24: qty 0.8

## 2014-12-24 MED ORDER — CAPECITABINE 500 MG PO TABS: ORAL_TABLET | ORAL | Status: DC

## 2014-12-24 MED ORDER — ENOXAPARIN SODIUM 80 MG/0.8ML ~~LOC~~ SOLN: 1.0000 mg/kg | Freq: Two times a day (BID) | SUBCUTANEOUS | Status: DC

## 2014-12-24 MED ORDER — CARVEDILOL 3.125 MG PO TABS: 3.1250 mg | ORAL_TABLET | Freq: Two times a day (BID) | ORAL | Status: DC

## 2014-12-24 MED ORDER — PANTOPRAZOLE SODIUM 40 MG PO TBEC: 40.0000 mg | DELAYED_RELEASE_TABLET | Freq: Every day | ORAL | Status: DC

## 2014-12-24 NOTE — Care Management Note (Signed)
Case Management Note  Patient Details  Name: Patrick [name] MRN: [id number] Date of Birth: [birth year]  Subjective/Objective:  69 yr old male admitted with a GI Bleed. Physical therapist has reccommended home health PT.    Action/Plan:  Case manager spoke with patient concerning home health needs. Choice was offered. Patient states that he doesn't need a walker, has a cane for assistance if he feels weak. Referal was called to [place], [facility].    Expected Discharge Date:   12/25/14               Expected Discharge Plan:   Home with Home Health PT  In-House Referral:  NA  Discharge planning Services  CM Consult  Post Acute Care Choice:  Home Health Choice offered to:  Patient  DME Arranged:  N/A DME Agency:  NA  HH Arranged:  PT HH Agency:  [place] [facility]  Status of Service:  Completed, signed off  Medicare Important Message Given:  Yes-second notification given Date Medicare IM Given:    Medicare IM give by:    Date Additional Medicare IM Given:    Additional Medicare Important Message give by:     If discussed at [place] of Stay Meetings, dates discussed:    Additional Comments:  Ninfa Meeker, RN 12/24/2014, 2:26 PM

## 2014-12-24 NOTE — Evaluation (Signed)
Physical Therapy Evaluation and Discharge Patient Details Name: Patrick [name] MRN: [id number] DOB: [birth year] Today's Date: 12/24/2014   History of Present Illness   Patient was at church singing in the choir when he got extremely weak and had to sit down , had been having dark stools, weakness and fatigue and was found to have low Hgb in ED  Clinical Impression  Patient evaluated by Physical Therapy with no further acute PT needs identified. All education has been completed and the patient has no further questions. Ambulates generally well but demonstrates generalized weakness and some difficulty with higher level dynamic balance tasks. Safely navigates stairs and uses a cane for support currently. Would benefit from HHPT to further progress strength and independence with mobility. See below for any follow-up Physial Therapy or equipment needs. PT is signing off. Thank you for this referral.     Follow Up Recommendations Home health PT;Supervision - Intermittent    Equipment Recommendations  None recommended by PT    Recommendations for Other Services       Precautions / Restrictions Precautions Precautions: None Restrictions Weight Bearing Restrictions: No      Mobility  Bed Mobility Overal bed mobility: Modified Independent             General bed mobility comments: in recliner  Transfers Overall transfer level: Modified independent Equipment used: Straight cane Transfers: Sit to/from Stand Sit to Stand: Modified independent (Device/Increase time)         General transfer comment: Extra time with use of cane. performed x2 no physical assist  Ambulation/Gait Ambulation/Gait assistance: Supervision Ambulation Distance (Feet): 200 Feet Assistive device: None;Straight cane Gait Pattern/deviations: Step-through pattern;Decreased stride length Gait velocity: decreased Gait velocity interpretation: Below normal speed for age/gender General Gait Details:  Demonstrates minimal sway, improved gait speed with use of a cane for support. No loss of balance with or without assistive device today. Tolerated higher level challenges too including high marching, variable speeds, quick turns and backwards stepping. No overt loss of balance during these tasks but were obviously challenging to patient.  Stairs Stairs: Yes Stairs assistance: Supervision Stair Management: One rail Right;Alternating pattern;Step to pattern;Forwards;With cane Number of Stairs: 13 General stair comments: Supervision for safety. No loss of balance, cues for sequencing and safety.  Wheelchair Mobility    Modified Rankin (Stroke Patients Only)       Balance Overall balance assessment: Needs assistance Sitting-balance support: No upper extremity supported;Feet supported Sitting balance-Leahy Scale: Normal     Standing balance support: No upper extremity supported Standing balance-Leahy Scale: Fair                               Pertinent Vitals/Pain Pain Assessment: No/denies pain    Home Living Family/patient expects to be discharged to:: Private residence Living Arrangements: Children Available Help at Discharge: Family Type of Home: Apartment Home Access: Level entry     Home Layout: Two level Home Equipment: Cane - single point      Prior Function Level of Independence: Independent         Comments: independent with ADLs, cooking, laundry and was driving     Hand Dominance   Dominant Hand: Right    Extremity/Trunk Assessment   Upper Extremity Assessment: Defer to OT evaluation           Lower Extremity Assessment: Generalized weakness         Communication   Communication: No difficulties  Cognition Arousal/Alertness: Awake/alert Behavior During Therapy: WFL for tasks assessed/performed Overall Cognitive Status: Within Functional Limits for tasks assessed                      General Comments General  comments (skin integrity, edema, etc.): Reviewed safety with mobility    Exercises        Assessment/Plan    PT Assessment Patent does not need any further PT services  PT Diagnosis Abnormality of gait;Generalized weakness   PT Problem List    PT Treatment Interventions     PT Goals (Current goals can be found in the Care Plan section) Acute Rehab PT Goals Patient Stated Goal: Be outside PT Goal Formulation: All assessment and education complete, DC therapy    Frequency     Barriers to discharge        Co-evaluation               End of Session   Activity Tolerance: Patient tolerated treatment well Patient left: in chair;with call bell/phone within reach Nurse Communication: Mobility status         Time: 1155-1207 PT Time Calculation (min) (ACUTE ONLY): 12 min   Charges:   PT Evaluation $Initial PT Evaluation Tier I: 1 Procedure     PT G CodesEllouise Newer 12/24/2014, 1:01 PM Camille Bal [place], Agency

## 2014-12-24 NOTE — Evaluation (Signed)
Occupational Therapy Evaluation Patient Details Name: Patrick [name] MRN: [id number] DOB: April 17, 1945 Today's Date: 12/24/2014    History of Present Illness  Patient was at church singing in the choir when he got extremely weak and had to sit down , had been having dark stools, weakness and fatigue and was found to have low Hgb in ED   Clinical Impression   Pt is at sup - Mod I level with ADLs. Pt able to use cane for ambulation from bathroom with Fair, guarded balance. No further acute OT services indicated at this time. Recommend PT consult for functional mobility safety and balance    Follow Up Recommendations  No OT follow up;Supervision - Intermittent    Equipment Recommendations  None recommended by OT    Recommendations for Other Services       Precautions / Restrictions        Mobility Bed Mobility Overal bed mobility: Modified Independent                Transfers Overall transfer level: Modified independent Equipment used: Rolling walker (2 wheeled);Straight cane                  Balance Overall balance assessment: Needs assistance   Sitting balance-Leahy Scale: Good       Standing balance-Leahy Scale: Fair                              ADL Overall ADL's : Needs assistance/impaired     Grooming: Wash/dry hands;Wash/dry face;Modified independent   Upper Body Bathing: Modified independent   Lower Body Bathing: Supervison/ safety   Upper Body Dressing : Modified independent   Lower Body Dressing: Supervision/safety   Toilet Transfer: Supervision/safety   Toileting- Clothing Manipulation and Hygiene: Modified independent   Tub/ Shower Transfer: Supervision/safety;Ambulation;Grab bars   Functional mobility during ADLs: Supervision/safety;Min guard       Vision  wears glasses at all times   Perception Perception Perception Tested?: No   Praxis Praxis Praxis tested?: Not tested    Pertinent Vitals/Pain Pain  Assessment: No/denies pain     Hand Dominance Right   Extremity/Trunk Assessment Upper Extremity Assessment Upper Extremity Assessment: Overall WFL for tasks assessed   Lower Extremity Assessment Lower Extremity Assessment: Defer to PT evaluation       Communication Communication Communication: No difficulties   Cognition Arousal/Alertness: Awake/alert Behavior During Therapy: WFL for tasks assessed/performed Overall Cognitive Status: Within Functional Limits for tasks assessed                     General Comments   pt very pleasant and cooperative                 Home Living Family/patient expects to be discharged to:: Private residence Living Arrangements: Children Available Help at Discharge: Family Type of Home: House       Home Layout: Two level Alternate Level Stairs-Number of Steps: 15 Alternate Level Stairs-Rails: Right Bathroom Shower/Tub: Teacher, early years/pre: Standard     Home Equipment: Cane - single point          Prior Functioning/Environment Level of Independence: Independent        Comments: independent with ADLs, cooking, laundry and was driving    OT Diagnosis: Generalized weakness   OT Problem List: Decreased activity tolerance   OT Treatment/Interventions:      OT Goals(Current goals can be found in  the care plan section) Acute Rehab OT Goals OT Goal Formulation: With patient Time For Goal Achievement: 12/24/14  OT Frequency:     Barriers to D/C:  none                        End of Session Equipment Utilized During Treatment: Rolling walker;Other (comment) (cane)  Activity Tolerance: Patient tolerated treatment well Patient left: in chair;with call bell/phone within reach   Time: 0957-1022 OT Time Calculation (min): 25 min Charges:  OT General Charges $OT Visit: 1 Procedure OT Evaluation $Initial OT Evaluation Tier I: 1 Procedure OT Treatments $Therapeutic Activity: 8-22  mins G-Codes:    Patrick [name] 12/24/2014, 11:07 AM

## 2014-12-24 NOTE — Progress Notes (Signed)
Patient to discharge on treatment dose of Lovenox for treatment of AFib and cancer.  SCr 0.85, CrCl ~85-40mL/min.  Hgb 9.1, plts 233.  Discussed with Dr. Sherral Hammers and agreed to 1mg /kg dosing.  Plan: -Lovenox 80mg  subQ q12h *patient can take his next dose after discharge on 10/21 at 0500 in order to avoid waking up in the middle of the night. After that dose, each subsequent dose can be pushed back by ~1h until patient is on a q12h schedule that works for him (ie give 10/21 at 0500, then 1800, then 10/22 at 0700 and 2000 so patient would end up on an 8a/8p schedule) -patient will follow up with oncology and long term plan can be adjusted if needed  Hoa Deriso D. Sayge Brienza, PharmD, BCPS Clinical Pharmacist Pager: [phone] 12/24/2014 2:31 PM

## 2014-12-24 NOTE — Discharge Summary (Signed)
Physician Discharge Summary  Patrick [name] WIO:[id number] DOB: 06/26/1945 DOA: 12/20/2014  PCP: Thressa Sheller, MD  Admit date: 12/20/2014 Discharge date: 12/24/2014  Time spent: 40 minutes  Recommendations for Outpatient Follow-up:  Upper GI bleed / melena  -Likely due to his known gastric cancer in the setting of coagulopathy related to supratherapeutic INR on Coumadin - with correction of INR bleeding has ceased  -Protonic 40 mg daily -Follow-up with Dr. Thressa Sheller in 7-10 days upper GI bleed, A. Fib, HTN  Acute blood loss anemia -Status post transfused 4 units PRBC thus far -Stable  Lactic acidosis -Was most likely secondary to gastric cancer/GI bleed. Has resolved  Atrial Fibrillation -Continue Coreg 3.125 mg BID  -Given patient has a neoplasm and was supratherapeutic on Coumadin + Xeloda (black box warning with Coumadin use) will start patient on Lovenox prior to discharge -Lovenox 80 mg BID   Dilated cardiomyopathy  -See echocardiogram from 09/15/2014 -Repeat echocardiogram; shows dilated cardiomyopathy with pulmonary hypertension see results below  Pulmonary hypertension - See atrial fibrillation   TxN1 adenocarcinoma of the gastric antrum -Actively under the care of Dr. Alen Blew  - resume Xeloda 1000mg   BID  -Follow-up with Dr.Firas N Shadad in 7 days, adenocarcinoma of gastric antrum  Diabetes mellitus type II -Restart metformin 1000 mg BID -Restart glipizide XL 5 mg daily  Essential hypertension -Blood pressure well controlled  AKI -Resolved     Discharge Diagnoses:  Principal Problem:   GI bleed Active Problems:   Essential hypertension   Persistent atrial fibrillation ([place])   Long term current use of anticoagulant therapy   TxN1 adenocarcinoma of the gastric antrum   Diabetes mellitus type II, non insulin dependent (HCC)   Acute blood loss anemia   Upper GI bleed   Elevated INR   Bleeding gastrointestinal   Acute kidney injury  ([place])   Discharge Condition: Stable  Diet recommendation: American diabetic Association  Filed Weights   12/22/14 1700  Weight: 80.1 kg (176 lb 9.4 oz)    History of present illness:  69 year old BM PMHx Atrial fibrillation on anticoagulation, HTN, HLD, Diabetes Mellitus type 2, and adenocarcinoma of the gastric atrium status post radiation therapy currently receiving chemotherapy   Presented to the emergency department for severe weakness. Patient was at church singing in the choir when he got extremely weak and had to sit down. Members from the church persuaded him to come to the hospital. INR was noted to be 7.5 and his hemoglobin was 5.8. Patient admits to having dark stools for 3 weeks as well as increased weakness and fatigue over 1 week. He notes that his last INR was 2.7 on 11/16/2014.  During his admission patient was treated for acute GI bleed most likely secondary to interaction between Coumadin+Xeloda (black box warning of serious GI bleeding with this combination). Patient received 4 units PRBC and warfarin was discontinued. Patient's hemoglobin level stabilized. Warfarin WELL NOT be restarted at discharge.     Procedures: 10/19 echocardiogram;- LVEF= 55% to 60%.  - Left atrium: moderately dilated. - Right atrium: moderately dilated. - Tricuspid valve: moderate regurgitation  - Pulmonary arteries: PA peak pressure: 51 mm Hg (S).     Discharge Exam: Filed Vitals:   12/23/14 1510 12/23/14 2055 12/24/14 0523 12/24/14 0939  BP: 111/59 105/53 103/51 111/65  Pulse: 73 70 72 98  Temp: 97.6 F (36.4 C) 98 F (36.7 C) 98.3 F (36.8 C) 98.8 F (37.1 C)  TempSrc: Oral Oral Oral Oral  Resp: 16 16 16  17  Height:      Weight:      SpO2: 100% 100% 100% 100%    General: A/O 4, NAD, No acute respiratory distress Eyes: Negative headache, eye pain, double vision,negative scleral hemorrhage ENT: Negative Runny nose, negative ear pain, negative gingival bleeding, Neck:  Negative scars, masses, torticollis, lymphadenopathy, JVD Lungs: Clear to auscultation bilaterally without wheezes or crackles Cardiovascular: Irregular irregular rhythm and rate, without murmur gallop or rub normal S1 and S2 Abdomen:negative abdominal pain, nondistended, positive soft, bowel sounds, no rebound, no ascites, no appreciable mass   Discharge Instructions     Medication List    STOP taking these medications        DILT-XR 180 MG 24 hr capsule  Generic drug:  diltiazem     furosemide 20 MG tablet  Commonly known as:  LASIX     lisinopril-hydrochlorothiazide 20-12.5 MG tablet  Commonly known as:  PRINZIDE,ZESTORETIC     prochlorperazine 10 MG tablet  Commonly known as:  COMPAZINE     warfarin 5 MG tablet  Commonly known as:  COUMADIN      TAKE these medications        aspirin 81 MG EC tablet  Take 81 mg by mouth at bedtime.     capecitabine 500 MG tablet  Commonly known as:  XELODA  Take 2 tablets by mouth twice a day.     carvedilol 3.125 MG tablet  Commonly known as:  COREG  Take 1 tablet (3.125 mg total) by mouth 2 (two) times daily with a meal.     enoxaparin 80 MG/0.8ML injection  Commonly known as:  LOVENOX  Inject 0.8 mLs (80 mg total) into the skin every 12 (twelve) hours.     ferrous sulfate 325 (65 FE) MG tablet  Take 325 mg by mouth 2 (two) times daily with a meal.     glipiZIDE 5 MG 24 hr tablet  Commonly known as:  GLUCOTROL XL  Take 5 mg by mouth 2 (two) times daily.     LIPITOR 10 MG tablet  Generic drug:  atorvastatin  Take 10 mg by mouth daily.     loperamide 2 MG tablet  Commonly known as:  IMODIUM A-D  Take 2 mg by mouth 4 (four) times daily as needed for diarrhea or loose stools.     metFORMIN 1000 MG tablet  Commonly known as:  GLUCOPHAGE  Take 1,000 mg by mouth 2 (two) times daily with a meal.     pantoprazole 40 MG tablet  Commonly known as:  PROTONIX  Take 1 tablet (40 mg total) by mouth daily.     PROAIR HFA 108  (90 BASE) MCG/ACT inhaler  Generic drug:  albuterol  Inhale 2 puffs into the lungs 4 (four) times daily as needed for wheezing or shortness of breath.       No Known Allergies Follow-up Information    Follow up with Thressa Sheller, MD. Schedule an appointment as soon as possible for a visit in 1 week.   Specialty:  Internal Medicine   Why:  Follow-up with Dr. Thressa Sheller in 7-10 days upper GI bleed, A. Fib, HTN   Contact information:   [place], [place]  [address] [phone]       Follow up with [facility], MD. Schedule an appointment as soon as possible for a visit in 1 week.   Specialty:  Oncology   Why:  Follow-up with Dr.Firas N Shadad in 7 days, adenocarcinoma of  gastric antrum   Contact information:   [address] [place] [address] [phone]        The results of significant diagnostics from this hospitalization (including imaging, microbiology, ancillary and laboratory) are listed below for reference.    Significant Diagnostic Studies: Dg Chest Port 1 View  12/20/2014  CLINICAL DATA:  Weakness, history of atrial fibrillation EXAM: PORTABLE CHEST 1 VIEW COMPARISON:  09/16/2014 FINDINGS: Borderline cardiomegaly. No acute infiltrate or pleural effusion. No pulmonary edema. Bony thorax is unremarkable. IMPRESSION: No active disease. Electronically Signed   By: Lahoma Crocker M.D.   On: 12/20/2014 12:19    Microbiology: Recent Results (from the past 240 hour(s))  Culture, blood (routine x 2)     Status: None (Preliminary result)   Collection Time: 12/20/14 11:50 AM  Result Value Ref Range Status   Specimen Description BLOOD RIGHT HAND  Final   Special Requests BOTTLES DRAWN AEROBIC ONLY 10CC  Final   Culture NO GROWTH 4 DAYS  Final   Report Status PENDING  Incomplete  Culture, blood (routine x 2)     Status: None (Preliminary result)   Collection Time: 12/20/14 12:20 PM  Result Value Ref Range Status   Specimen Description BLOOD  RIGHT HAND  Final   Special Requests BOTTLES DRAWN AEROBIC ONLY 10CC  Final   Culture NO GROWTH 4 DAYS  Final   Report Status PENDING  Incomplete  MRSA PCR Screening     Status: None   Collection Time: 12/20/14  6:08 PM  Result Value Ref Range Status   MRSA by PCR NEGATIVE NEGATIVE Final    Comment:        The GeneXpert MRSA Assay (FDA approved for NASAL specimens only), is one component of a comprehensive MRSA colonization surveillance program. It is not intended to diagnose MRSA infection nor to guide or monitor treatment for MRSA infections.   Culture, blood (routine x 2)     Status: None (Preliminary result)   Collection Time: 12/22/14  8:15 PM  Result Value Ref Range Status   Specimen Description BLOOD RIGHT ANTECUBITAL  Final   Special Requests IN PEDIATRIC BOTTLE 3CC  Final   Culture NO GROWTH 2 DAYS  Final   Report Status PENDING  Incomplete  Culture, blood (routine x 2)     Status: None (Preliminary result)   Collection Time: 12/22/14  8:44 PM  Result Value Ref Range Status   Specimen Description BLOOD LEFT HAND  Final   Special Requests BOTTLES DRAWN AEROBIC ONLY 5CC  Final   Culture NO GROWTH 2 DAYS  Final   Report Status PENDING  Incomplete  Culture, Urine     Status: None   Collection Time: 12/22/14  9:53 PM  Result Value Ref Range Status   Specimen Description URINE, CLEAN CATCH  Final   Special Requests NONE  Final   Culture NO GROWTH 2 DAYS  Final   Report Status 12/24/2014 FINAL  Final     Labs: Basic Metabolic Panel:  Recent Labs Lab 12/20/14 1816 12/21/14 0636 12/22/14 0326 12/23/14 0126 12/24/14 0708  NA 137 134* 135 139 138  K 4.7 4.1 4.1 4.1 4.1  CL 113* 108 110 110 108  CO2 17* 19* 18* 21* 23  GLUCOSE 49* 40* 171* 90 127*  BUN 51* 34* 14 11 8   CREATININE 1.20 1.01 0.88 0.95 0.85  CALCIUM 8.0* 7.9* 8.0* 8.6* 8.9  MG  --   --   --  1.5* 1.5*  Liver Function Tests:  Recent Labs Lab 12/20/14 1137 12/22/14 0326 12/23/14 0126  12/24/14 0708  AST 25 19 18 18   ALT 16* 12* 13* 12*  ALKPHOS 56 51 48 56  BILITOT 0.5 0.3 0.5 0.8  PROT 6.4* 5.4* 5.2* 5.5*  ALBUMIN 3.5 3.0* 2.9* 3.0*   No results for input(s): LIPASE, AMYLASE in the last 168 hours. No results for input(s): AMMONIA in the last 168 hours. CBC:  Recent Labs Lab 12/20/14 1121  12/21/14 1844 12/22/14 0326 12/23/14 0126 12/23/14 1030 12/24/14 0708  WBC 5.7  < > 5.7 5.5 5.0 7.9 4.4  NEUTROABS 4.6  --   --   --  3.9  --   --   HGB 5.8*  < > 9.1* 9.7* 8.4* 10.1* 9.1*  HCT 18.2*  < > 26.5* 29.6* 25.2* 29.8* 27.9*  MCV 83.9  < > 82.8 85.8 84.3 84.9 85.6  PLT 296  < > 190 206 210 PLATELET CLUMPS NOTED ON SMEAR, UNABLE TO ESTIMATE 233  < > = values in this interval not displayed. Cardiac Enzymes: No results for input(s): CKTOTAL, CKMB, CKMBINDEX, TROPONINI in the last 168 hours. BNP: BNP (last 3 results) No results for input(s): BNP in the last 8760 hours.  ProBNP (last 3 results) No results for input(s): PROBNP in the last 8760 hours.  CBG:  Recent Labs Lab 12/23/14 1232 12/23/14 1649 12/23/14 2051 12/24/14 0757 12/24/14 1151  GLUCAP 138* 154* 239* 132* 228*       Signed:  Dia Crawford, MD [facility] [phone] pager

## 2014-12-26 LAB — CULTURE, BLOOD (ROUTINE X 2)
CULTURE: NO GROWTH
Culture: NO GROWTH

## 2014-12-27 LAB — CULTURE, BLOOD (ROUTINE X 2)
CULTURE: NO GROWTH
Culture: NO GROWTH

## 2014-12-28 ENCOUNTER — Ambulatory Visit: Payer: Medicare Other | Admitting: Pharmacist Clinician (PhC)/ Clinical Pharmacy Specialist

## 2014-12-28 DIAGNOSIS — K922 Gastrointestinal hemorrhage, unspecified: Secondary | ICD-10-CM | POA: Diagnosis not present

## 2014-12-28 DIAGNOSIS — I10 Essential (primary) hypertension: Secondary | ICD-10-CM | POA: Diagnosis not present

## 2014-12-28 DIAGNOSIS — I4891 Unspecified atrial fibrillation: Secondary | ICD-10-CM | POA: Diagnosis not present

## 2014-12-28 DIAGNOSIS — E119 Type 2 diabetes mellitus without complications: Secondary | ICD-10-CM | POA: Diagnosis not present

## 2014-12-28 DIAGNOSIS — C163 Malignant neoplasm of pyloric antrum: Secondary | ICD-10-CM | POA: Diagnosis not present

## 2014-12-28 DIAGNOSIS — D62 Acute posthemorrhagic anemia: Secondary | ICD-10-CM | POA: Diagnosis not present

## 2014-12-30 ENCOUNTER — Ambulatory Visit (INDEPENDENT_AMBULATORY_CARE_PROVIDER_SITE_OTHER): Payer: Medicare Other | Admitting: Pharmacist Clinician (PhC)/ Clinical Pharmacy Specialist

## 2014-12-30 DIAGNOSIS — I481 Persistent atrial fibrillation: Secondary | ICD-10-CM

## 2014-12-30 DIAGNOSIS — Z7901 Long term (current) use of anticoagulants: Secondary | ICD-10-CM

## 2014-12-30 DIAGNOSIS — D62 Acute posthemorrhagic anemia: Secondary | ICD-10-CM

## 2014-12-30 DIAGNOSIS — I4819 Other persistent atrial fibrillation: Secondary | ICD-10-CM

## 2014-12-30 LAB — POCT INR: INR: 2.5

## 2014-12-31 ENCOUNTER — Encounter: Payer: Self-pay | Admitting: Oncology

## 2014-12-31 DIAGNOSIS — E1122 Type 2 diabetes mellitus with diabetic chronic kidney disease: Secondary | ICD-10-CM | POA: Diagnosis not present

## 2014-12-31 DIAGNOSIS — K922 Gastrointestinal hemorrhage, unspecified: Secondary | ICD-10-CM | POA: Diagnosis not present

## 2014-12-31 NOTE — Progress Notes (Signed)
Per biologics capecitabine was shipped fedex

## 2015-01-01 ENCOUNTER — Encounter: Payer: Self-pay | Admitting: Oncology

## 2015-01-01 DIAGNOSIS — E119 Type 2 diabetes mellitus without complications: Secondary | ICD-10-CM | POA: Diagnosis not present

## 2015-01-01 DIAGNOSIS — I10 Essential (primary) hypertension: Secondary | ICD-10-CM | POA: Diagnosis not present

## 2015-01-01 DIAGNOSIS — K922 Gastrointestinal hemorrhage, unspecified: Secondary | ICD-10-CM | POA: Diagnosis not present

## 2015-01-01 DIAGNOSIS — I4891 Unspecified atrial fibrillation: Secondary | ICD-10-CM | POA: Diagnosis not present

## 2015-01-01 DIAGNOSIS — D62 Acute posthemorrhagic anemia: Secondary | ICD-10-CM | POA: Diagnosis not present

## 2015-01-01 DIAGNOSIS — C163 Malignant neoplasm of pyloric antrum: Secondary | ICD-10-CM | POA: Diagnosis not present

## 2015-01-01 NOTE — Progress Notes (Signed)
Per biologics capecitabine shipped via fedex

## 2015-01-07 DIAGNOSIS — K922 Gastrointestinal hemorrhage, unspecified: Secondary | ICD-10-CM | POA: Diagnosis not present

## 2015-01-07 DIAGNOSIS — E119 Type 2 diabetes mellitus without complications: Secondary | ICD-10-CM | POA: Diagnosis not present

## 2015-01-07 DIAGNOSIS — Z7901 Long term (current) use of anticoagulants: Secondary | ICD-10-CM | POA: Diagnosis not present

## 2015-01-07 DIAGNOSIS — D62 Acute posthemorrhagic anemia: Secondary | ICD-10-CM | POA: Diagnosis not present

## 2015-01-07 DIAGNOSIS — C163 Malignant neoplasm of pyloric antrum: Secondary | ICD-10-CM | POA: Diagnosis not present

## 2015-01-07 DIAGNOSIS — D649 Anemia, unspecified: Secondary | ICD-10-CM | POA: Diagnosis not present

## 2015-01-07 DIAGNOSIS — C169 Malignant neoplasm of stomach, unspecified: Secondary | ICD-10-CM | POA: Diagnosis not present

## 2015-01-08 ENCOUNTER — Other Ambulatory Visit: Payer: Self-pay | Admitting: *Deleted

## 2015-01-08 DIAGNOSIS — C163 Malignant neoplasm of pyloric antrum: Secondary | ICD-10-CM

## 2015-01-08 MED ORDER — CAPECITABINE 500 MG PO TABS: ORAL_TABLET | ORAL | Status: DC

## 2015-01-11 ENCOUNTER — Encounter (HOSPITAL_COMMUNITY)
Admission: RE | Admit: 2015-01-11 | Discharge: 2015-01-11 | Disposition: A | Discharge status: Home / Self Care | Payer: Medicare Other | Source: Ambulatory Visit | Attending: Oncology | Admitting: Oncology

## 2015-01-11 ENCOUNTER — Other Ambulatory Visit (HOSPITAL_BASED_OUTPATIENT_CLINIC_OR_DEPARTMENT_OTHER): Payer: Medicare Other

## 2015-01-11 DIAGNOSIS — E119 Type 2 diabetes mellitus without complications: Secondary | ICD-10-CM | POA: Diagnosis not present

## 2015-01-11 DIAGNOSIS — C163 Malignant neoplasm of pyloric antrum: Secondary | ICD-10-CM | POA: Diagnosis not present

## 2015-01-11 DIAGNOSIS — C7951 Secondary malignant neoplasm of bone: Secondary | ICD-10-CM | POA: Diagnosis not present

## 2015-01-11 DIAGNOSIS — R5383 Other fatigue: Secondary | ICD-10-CM | POA: Diagnosis not present

## 2015-01-11 DIAGNOSIS — E785 Hyperlipidemia, unspecified: Secondary | ICD-10-CM | POA: Diagnosis not present

## 2015-01-11 DIAGNOSIS — Z79899 Other long term (current) drug therapy: Secondary | ICD-10-CM | POA: Diagnosis not present

## 2015-01-11 DIAGNOSIS — Z7984 Long term (current) use of oral hypoglycemic drugs: Secondary | ICD-10-CM | POA: Diagnosis not present

## 2015-01-11 DIAGNOSIS — Z923 Personal history of irradiation: Secondary | ICD-10-CM | POA: Diagnosis not present

## 2015-01-11 DIAGNOSIS — I10 Essential (primary) hypertension: Secondary | ICD-10-CM | POA: Diagnosis not present

## 2015-01-11 DIAGNOSIS — C169 Malignant neoplasm of stomach, unspecified: Secondary | ICD-10-CM | POA: Diagnosis not present

## 2015-01-11 DIAGNOSIS — Z9841 Cataract extraction status, right eye: Secondary | ICD-10-CM | POA: Diagnosis not present

## 2015-01-11 DIAGNOSIS — Z9842 Cataract extraction status, left eye: Secondary | ICD-10-CM | POA: Diagnosis not present

## 2015-01-11 DIAGNOSIS — Z7982 Long term (current) use of aspirin: Secondary | ICD-10-CM | POA: Diagnosis not present

## 2015-01-11 DIAGNOSIS — N179 Acute kidney failure, unspecified: Secondary | ICD-10-CM | POA: Diagnosis not present

## 2015-01-11 DIAGNOSIS — J45909 Unspecified asthma, uncomplicated: Secondary | ICD-10-CM | POA: Diagnosis not present

## 2015-01-11 DIAGNOSIS — K922 Gastrointestinal hemorrhage, unspecified: Secondary | ICD-10-CM | POA: Diagnosis not present

## 2015-01-11 DIAGNOSIS — R42 Dizziness and giddiness: Secondary | ICD-10-CM | POA: Diagnosis not present

## 2015-01-11 DIAGNOSIS — Z8673 Personal history of transient ischemic attack (TIA), and cerebral infarction without residual deficits: Secondary | ICD-10-CM | POA: Diagnosis not present

## 2015-01-11 DIAGNOSIS — Z87891 Personal history of nicotine dependence: Secondary | ICD-10-CM | POA: Diagnosis not present

## 2015-01-11 DIAGNOSIS — I959 Hypotension, unspecified: Secondary | ICD-10-CM | POA: Diagnosis not present

## 2015-01-11 DIAGNOSIS — I481 Persistent atrial fibrillation: Secondary | ICD-10-CM | POA: Diagnosis not present

## 2015-01-11 DIAGNOSIS — K921 Melena: Secondary | ICD-10-CM | POA: Diagnosis not present

## 2015-01-11 DIAGNOSIS — I4581 Long QT syndrome: Secondary | ICD-10-CM | POA: Diagnosis not present

## 2015-01-11 DIAGNOSIS — D62 Acute posthemorrhagic anemia: Secondary | ICD-10-CM | POA: Diagnosis not present

## 2015-01-11 LAB — CBC WITH DIFFERENTIAL/PLATELET
BASO%: 1.1 % (ref 0.0–2.0)
BASOS ABS: 0 10*3/uL (ref 0.0–0.1)
EOS ABS: 0.1 10*3/uL (ref 0.0–0.5)
EOS%: 2.1 % (ref 0.0–7.0)
HCT: 30.2 % — ABNORMAL LOW (ref 38.4–49.9)
HEMOGLOBIN: 9.8 g/dL — AB (ref 13.0–17.1)
LYMPH%: 15.3 % (ref 14.0–49.0)
MCH: 28.6 pg (ref 27.2–33.4)
MCHC: 32.5 g/dL (ref 32.0–36.0)
MCV: 88 fL (ref 79.3–98.0)
MONO#: 0.4 10*3/uL (ref 0.1–0.9)
MONO%: 11 % (ref 0.0–14.0)
NEUT#: 2.6 10*3/uL (ref 1.5–6.5)
NEUT%: 70.5 % (ref 39.0–75.0)
Platelets: 296 10*3/uL (ref 140–400)
RBC: 3.43 10*6/uL — ABNORMAL LOW (ref 4.20–5.82)
RDW: 20.3 % — AB (ref 11.0–14.6)
WBC: 3.7 10*3/uL — ABNORMAL LOW (ref 4.0–10.3)
lymph#: 0.6 10*3/uL — ABNORMAL LOW (ref 0.9–3.3)

## 2015-01-11 LAB — COMPREHENSIVE METABOLIC PANEL (CC13)
ALBUMIN: 3.7 g/dL (ref 3.5–5.0)
ALK PHOS: 76 U/L (ref 40–150)
ALT: 36 U/L (ref 0–55)
AST: 29 U/L (ref 5–34)
Anion Gap: 9 mEq/L (ref 3–11)
BUN: 15.9 mg/dL (ref 7.0–26.0)
CO2: 28 mEq/L (ref 22–29)
Calcium: 9.4 mg/dL (ref 8.4–10.4)
Chloride: 103 mEq/L (ref 98–109)
Creatinine: 1 mg/dL (ref 0.7–1.3)
EGFR: 83 mL/min/{1.73_m2} — AB (ref >90)
GLUCOSE: 101 mg/dL (ref 70–140)
POTASSIUM: 3.7 meq/L (ref 3.5–5.1)
SODIUM: 141 meq/L (ref 136–145)
Total Bilirubin: 0.51 mg/dL (ref 0.20–1.20)
Total Protein: 6.9 g/dL (ref 6.4–8.3)

## 2015-01-11 MED ORDER — TECHNETIUM TC 99M MEDRONATE IV KIT
25.5000 | PACK | Freq: Once | INTRAVENOUS | Status: AC | PRN
  Administered 2015-01-11: 25.5 via INTRAVENOUS

## 2015-01-12 ENCOUNTER — Ambulatory Visit: Payer: Medicare Other | Admitting: Podiatry

## 2015-01-12 ENCOUNTER — Encounter (HOSPITAL_COMMUNITY): Payer: Self-pay | Admitting: Emergency Medicine

## 2015-01-12 ENCOUNTER — Ambulatory Visit (HOSPITAL_COMMUNITY): Payer: Medicare Other

## 2015-01-12 ENCOUNTER — Inpatient Hospital Stay (HOSPITAL_COMMUNITY)
Admission: EM | Admit: 2015-01-12 | Discharge: 2015-01-15 | DRG: 375 | Disposition: A | Discharge status: Home / Self Care | Payer: Medicare Other | Attending: Internal Medicine | Admitting: Internal Medicine

## 2015-01-12 ENCOUNTER — Encounter (HOSPITAL_COMMUNITY): Payer: Medicare Other

## 2015-01-12 ENCOUNTER — Other Ambulatory Visit: Payer: Self-pay

## 2015-01-12 DIAGNOSIS — C7951 Secondary malignant neoplasm of bone: Secondary | ICD-10-CM | POA: Diagnosis present

## 2015-01-12 DIAGNOSIS — Z7984 Long term (current) use of oral hypoglycemic drugs: Secondary | ICD-10-CM

## 2015-01-12 DIAGNOSIS — Z8673 Personal history of transient ischemic attack (TIA), and cerebral infarction without residual deficits: Secondary | ICD-10-CM | POA: Diagnosis not present

## 2015-01-12 DIAGNOSIS — R5383 Other fatigue: Secondary | ICD-10-CM | POA: Diagnosis not present

## 2015-01-12 DIAGNOSIS — C163 Malignant neoplasm of pyloric antrum: Principal | ICD-10-CM | POA: Diagnosis present

## 2015-01-12 DIAGNOSIS — Z87891 Personal history of nicotine dependence: Secondary | ICD-10-CM

## 2015-01-12 DIAGNOSIS — I4891 Unspecified atrial fibrillation: Secondary | ICD-10-CM | POA: Diagnosis not present

## 2015-01-12 DIAGNOSIS — D62 Acute posthemorrhagic anemia: Secondary | ICD-10-CM | POA: Diagnosis not present

## 2015-01-12 DIAGNOSIS — Z923 Personal history of irradiation: Secondary | ICD-10-CM | POA: Diagnosis not present

## 2015-01-12 DIAGNOSIS — I959 Hypotension, unspecified: Secondary | ICD-10-CM | POA: Diagnosis not present

## 2015-01-12 DIAGNOSIS — K922 Gastrointestinal hemorrhage, unspecified: Secondary | ICD-10-CM

## 2015-01-12 DIAGNOSIS — J45909 Unspecified asthma, uncomplicated: Secondary | ICD-10-CM | POA: Diagnosis present

## 2015-01-12 DIAGNOSIS — Z9841 Cataract extraction status, right eye: Secondary | ICD-10-CM

## 2015-01-12 DIAGNOSIS — I481 Persistent atrial fibrillation: Secondary | ICD-10-CM | POA: Diagnosis present

## 2015-01-12 DIAGNOSIS — I482 Chronic atrial fibrillation, unspecified: Secondary | ICD-10-CM | POA: Diagnosis present

## 2015-01-12 DIAGNOSIS — Z9842 Cataract extraction status, left eye: Secondary | ICD-10-CM

## 2015-01-12 DIAGNOSIS — R933 Abnormal findings on diagnostic imaging of other parts of digestive tract: Secondary | ICD-10-CM | POA: Diagnosis not present

## 2015-01-12 DIAGNOSIS — N179 Acute kidney failure, unspecified: Secondary | ICD-10-CM | POA: Diagnosis present

## 2015-01-12 DIAGNOSIS — I4581 Long QT syndrome: Secondary | ICD-10-CM | POA: Diagnosis present

## 2015-01-12 DIAGNOSIS — E785 Hyperlipidemia, unspecified: Secondary | ICD-10-CM | POA: Diagnosis present

## 2015-01-12 DIAGNOSIS — K921 Melena: Secondary | ICD-10-CM | POA: Diagnosis present

## 2015-01-12 DIAGNOSIS — E119 Type 2 diabetes mellitus without complications: Secondary | ICD-10-CM | POA: Diagnosis not present

## 2015-01-12 DIAGNOSIS — R404 Transient alteration of awareness: Secondary | ICD-10-CM | POA: Diagnosis not present

## 2015-01-12 DIAGNOSIS — Z79899 Other long term (current) drug therapy: Secondary | ICD-10-CM | POA: Diagnosis not present

## 2015-01-12 DIAGNOSIS — Z7982 Long term (current) use of aspirin: Secondary | ICD-10-CM

## 2015-01-12 DIAGNOSIS — R531 Weakness: Secondary | ICD-10-CM | POA: Diagnosis not present

## 2015-01-12 DIAGNOSIS — C169 Malignant neoplasm of stomach, unspecified: Secondary | ICD-10-CM | POA: Diagnosis not present

## 2015-01-12 DIAGNOSIS — I10 Essential (primary) hypertension: Secondary | ICD-10-CM | POA: Diagnosis not present

## 2015-01-12 DIAGNOSIS — R42 Dizziness and giddiness: Secondary | ICD-10-CM | POA: Diagnosis not present

## 2015-01-12 DIAGNOSIS — D500 Iron deficiency anemia secondary to blood loss (chronic): Secondary | ICD-10-CM | POA: Diagnosis not present

## 2015-01-12 HISTORY — DX: Gastrointestinal hemorrhage, unspecified: K92.2

## 2015-01-12 HISTORY — DX: Anemia, unspecified: D64.9

## 2015-01-12 HISTORY — DX: Type 2 diabetes mellitus without complications: E11.9

## 2015-01-12 HISTORY — DX: Personal history of other medical treatment: Z92.89

## 2015-01-12 LAB — BASIC METABOLIC PANEL
ANION GAP: 12 (ref 5–15)
BUN: 41 mg/dL — AB (ref 6–20)
CHLORIDE: 107 mmol/L (ref 101–111)
CO2: 21 mmol/L — AB (ref 22–32)
Calcium: 8.7 mg/dL — ABNORMAL LOW (ref 8.9–10.3)
Creatinine, Ser: 1.08 mg/dL (ref 0.61–1.24)
GFR calc Af Amer: >60 mL/min (ref >60)
GLUCOSE: 94 mg/dL (ref 65–99)
POTASSIUM: 4.1 mmol/L (ref 3.5–5.1)
Sodium: 140 mmol/L (ref 135–145)

## 2015-01-12 LAB — PROTIME-INR
INR: 1.17 (ref 0.00–1.49)
PROTHROMBIN TIME: 15.1 s (ref 11.6–15.2)

## 2015-01-12 LAB — CBG MONITORING, ED: Glucose-Capillary: 85 mg/dL (ref 65–99)

## 2015-01-12 LAB — CBC
HEMATOCRIT: 23.8 % — AB (ref 39.0–52.0)
HEMATOCRIT: 23.6 % — AB (ref 39.0–52.0)
HEMOGLOBIN: 7.7 g/dL — AB (ref 13.0–17.0)
HEMOGLOBIN: 7.6 g/dL — AB (ref 13.0–17.0)
MCH: 28.1 pg (ref 26.0–34.0)
MCH: 27.9 pg (ref 26.0–34.0)
MCHC: 32.4 g/dL (ref 30.0–36.0)
MCHC: 32.2 g/dL (ref 30.0–36.0)
MCV: 86.9 fL (ref 78.0–100.0)
MCV: 86.8 fL (ref 78.0–100.0)
Platelets: 253 10*3/uL (ref 150–400)
Platelets: 268 10*3/uL (ref 150–400)
RBC: 2.74 MIL/uL — ABNORMAL LOW (ref 4.22–5.81)
RBC: 2.72 MIL/uL — AB (ref 4.22–5.81)
RDW: 20.4 % — AB (ref 11.5–15.5)
RDW: 20.3 % — ABNORMAL HIGH (ref 11.5–15.5)
WBC: 6.1 10*3/uL (ref 4.0–10.5)
WBC: 4.9 10*3/uL (ref 4.0–10.5)

## 2015-01-12 LAB — PREPARE RBC (CROSSMATCH)

## 2015-01-12 LAB — GLUCOSE, CAPILLARY: GLUCOSE-CAPILLARY: 82 mg/dL (ref 65–99)

## 2015-01-12 LAB — APTT: aPTT: 33 seconds (ref 24–37)

## 2015-01-12 LAB — MAGNESIUM: MAGNESIUM: 1.8 mg/dL (ref 1.7–2.4)

## 2015-01-12 LAB — POC OCCULT BLOOD, ED: Fecal Occult Bld: POSITIVE — AB

## 2015-01-12 MED ORDER — ACETAMINOPHEN 325 MG PO TABS: 650.0000 mg | ORAL_TABLET | Freq: Four times a day (QID) | ORAL | Status: DC | PRN

## 2015-01-12 MED ORDER — INSULIN ASPART 100 UNIT/ML ~~LOC~~ SOLN
0.0000 [IU] | Freq: Three times a day (TID) | SUBCUTANEOUS | Status: DC
Start: 2015-01-13 — End: 2015-01-15
  Administered 2015-01-13: 2 [IU] via SUBCUTANEOUS
  Administered 2015-01-15: 1 [IU] via SUBCUTANEOUS

## 2015-01-12 MED ORDER — ASPIRIN EC 81 MG PO TBEC
81.0000 mg | DELAYED_RELEASE_TABLET | Freq: Every day | ORAL | Status: DC
  Administered 2015-01-12: 81 mg via ORAL
  Filled 2015-01-12: qty 1

## 2015-01-12 MED ORDER — ONDANSETRON HCL 4 MG/2ML IJ SOLN: 4.0000 mg | Freq: Four times a day (QID) | INTRAMUSCULAR | Status: DC | PRN

## 2015-01-12 MED ORDER — CARVEDILOL 3.125 MG PO TABS
3.1250 mg | ORAL_TABLET | Freq: Two times a day (BID) | ORAL | Status: DC
Start: 2015-01-12 — End: 2015-01-15
  Administered 2015-01-13 – 2015-01-14 (×4): 3.125 mg via ORAL
  Filled 2015-01-12 (×5): qty 1

## 2015-01-12 MED ORDER — CAPECITABINE 500 MG PO TABS
1000.0000 mg | ORAL_TABLET | Freq: Two times a day (BID) | ORAL | Status: DC
  Administered 2015-01-13 – 2015-01-15 (×3): 1000 mg via ORAL
  Filled 2015-01-12 (×7): qty 2

## 2015-01-12 MED ORDER — ACETAMINOPHEN 650 MG RE SUPP: 650.0000 mg | Freq: Four times a day (QID) | RECTAL | Status: DC | PRN

## 2015-01-12 MED ORDER — SODIUM CHLORIDE 0.9 % IV SOLN
Freq: Once | INTRAVENOUS | Status: AC
  Administered 2015-01-12: 22:00:00 via INTRAVENOUS

## 2015-01-12 MED ORDER — ONDANSETRON HCL 4 MG PO TABS: 4.0000 mg | ORAL_TABLET | Freq: Four times a day (QID) | ORAL | Status: DC | PRN

## 2015-01-12 MED ORDER — DOCUSATE SODIUM 100 MG PO CAPS
100.0000 mg | ORAL_CAPSULE | Freq: Two times a day (BID) | ORAL | Status: DC
  Administered 2015-01-12 – 2015-01-15 (×4): 100 mg via ORAL
  Filled 2015-01-12 (×6): qty 1

## 2015-01-12 MED ORDER — ATORVASTATIN CALCIUM 10 MG PO TABS
10.0000 mg | ORAL_TABLET | Freq: Every day | ORAL | Status: DC
  Administered 2015-01-12 – 2015-01-15 (×4): 10 mg via ORAL
  Filled 2015-01-12 (×4): qty 1

## 2015-01-12 MED ORDER — INSULIN ASPART 100 UNIT/ML ~~LOC~~ SOLN
0.0000 [IU] | Freq: Every day | SUBCUTANEOUS | Status: DC
Start: 2015-01-12 — End: 2015-01-15
  Administered 2015-01-14: 2 [IU] via SUBCUTANEOUS

## 2015-01-12 NOTE — H&P (Addendum)
[facility] History and Physical  Patrick [name] JGG:[id number] DOB: 14-Jan-1946 DOA: 01/12/2015   PCP: Thressa Sheller, MD    Chief Complaint: weakness  HPI: Patrick [name] is a 69 y.o. male with adenocarcinoma of the stomach, A-fib on lovenox, DM, h/o CVA who was discharged on 10/20 after being treated for upper GI bleed. He was started on Lovenox instead of Coumadin. He is on Iron as well. He states he noted black tarry stools today and had 4 episodes of them. He felt quite weak subsequently and therefore presented to the ER. Hb checked yesterday was 9.8 and today is 7.7.   ROS  General: The patient denies anorexia, fever, weight loss Cardiac: Denies chest pain, syncope, palpitations, pedal edema  Respiratory: Denies cough, shortness of breath, wheezing GI: Denies severe indigestion/heartburn, abdominal pain, nausea, vomiting, diarrhea and constipation GU: Denies hematuria, incontinence, dysuria  Musculoskeletal: Denies arthritis  Skin: Denies suspicious skin lesions Neurologic: Denies focal weakness or numbness, change in vision Psychiatry: Denies depression or anxiety. Hematologic: + easy bruising/ bleeding  All other systems reviewed and found to be negative.  Past Medical History  Diagnosis Date  . Diabetes mellitus   . High blood pressure   . Stroke [facility]) 2008    residual left sided weakness  . Dyslipidemia   . Persistent atrial fibrillation ([place]) 01/14/2009    afib--cardioversion successful x1 shock; recurrent A. fib  . Bilateral cataracts 2014     status post extraction  . Stomach cancer ([place]) 10/21/13  . TxN1 adenocarcinoma of the gastric antrum 11/19/2013    Past Surgical History  Procedure Laterality Date  . Hernia repair    . Cardiac catheterization  01/15/2009    LV 40%, no occlusive coronary disease,norenal artery stenosis or abdmoninal aotric aneurysm  . Doppler echocardiography  10 /2012    EF normal -probably elevated left atrial pressures unable  to asses due to afib.;mildly scelrotic aortic valve but  no stenosis and mildlyelevated right atrial  pressures and pulm. pressures at 45-50 mmhg with mild to mod pulm  htn no change from 2011  . Cataract extraction, bilateral  2014  . Biopsy stomach  10/21/13    adenocarcinoma    Social History: does does no smoke or drink alcohol, no illicit drug use Lives at home with family- does not need a walker or cane, can manage ADLS    No Known Allergies  Family history:  No medical problems that he is aware of    Prior to Admission medications   Medication Sig Start Date End Date Taking? Authorizing Provider  aspirin 81 MG EC tablet Take 81 mg by mouth at bedtime.    Yes Historical Provider, MD  atorvastatin (LIPITOR) 10 MG tablet Take 10 mg by mouth daily.     Yes Historical Provider, MD  capecitabine (XELODA) 500 MG tablet Take 2 tablets by mouth twice a day. Patient taking differently: Take 1,000 mg/m2 by mouth 2 (two) times daily after a meal. Take 2 tablets by mouth twice a day. 01/08/15  Yes Wyatt Portela, MD  carvedilol (COREG) 3.125 MG tablet Take 1 tablet (3.125 mg total) by mouth 2 (two) times daily with a meal. 12/24/14  Yes Allie Bossier, MD  enoxaparin (LOVENOX) 80 MG/0.8ML injection Inject 0.8 mLs (80 mg total) into the skin every 12 (twelve) hours. 12/24/14  Yes Allie Bossier, MD  ferrous sulfate 325 (65 FE) MG tablet Take 325 mg by mouth 2 (two) times daily with a meal.  09/26/14  Yes Historical Provider, MD  glipiZIDE (GLUCOTROL XL) 5 MG 24 hr tablet Take 5 mg by mouth 2 (two) times daily.   Yes Historical Provider, MD  metFORMIN (GLUCOPHAGE) 1000 MG tablet Take 1,000 mg by mouth 2 (two) times daily with a meal.  08/19/14  Yes Historical Provider, MD  loperamide (IMODIUM A-D) 2 MG tablet Take 2 mg by mouth 4 (four) times daily as needed for diarrhea or loose stools.    Historical Provider, MD  pantoprazole (PROTONIX) 40 MG tablet Take 1 tablet (40 mg total) by mouth  daily. Patient not taking: Reported on 01/12/2015 12/24/14   Allie Bossier, MD  PROAIR HFA 108 (90 BASE) MCG/ACT inhaler Inhale 2 puffs into the lungs 4 (four) times daily as needed for wheezing or shortness of breath.  09/22/13   Historical Provider, MD     Physical Exam: Filed Vitals:   01/12/15 1440  BP: 97/49  Pulse: 80  Temp: 98 F (36.7 C)  Resp: 12  SpO2: 94%     General: AAo x 3, no acute distress HEENT: Normocephalic and Atraumatic, Mucous membranes pink                PERRLA; EOM intact; No scleral icterus,                 Nares: Patent, Oropharynx: Clear, Fair Dentition                 Neck: FROM, no cervical lymphadenopathy, thyromegaly, carotid bruit or JVD;  Breasts: deferred CHEST WALL: No tenderness  CHEST: Normal respiration, clear to auscultation bilaterally  HEART: Regular rate and rhythm; no murmurs rubs or gallops  BACK: No kyphosis or scoliosis; no CVA tenderness  GI: Positive Bowel Sounds, soft, non-tender; no masses, no organomegaly Rectal Exam: deferred MSK: No cyanosis, clubbing, or edema Genitalia: not examined  SKIN:  no rash or ulceration  CNS: Alert and Oriented x 4, Nonfocal exam, CN 2-12 intact  Labs on Admission:  Basic Metabolic Panel:  Recent Labs Lab 01/11/15 0844 01/12/15 1510  NA 141 140  K 3.7 4.1  CL  --  107  CO2 28 21*  GLUCOSE 101 94  BUN 15.9 41*  CREATININE 1.0 1.08  CALCIUM 9.4 8.7*   Liver Function Tests:  Recent Labs Lab 01/11/15 0844  AST 29  ALT 36  ALKPHOS 76  BILITOT 0.51  PROT 6.9  ALBUMIN 3.7   No results for input(s): LIPASE, AMYLASE in the last 168 hours. No results for input(s): AMMONIA in the last 168 hours. CBC:  Recent Labs Lab 01/11/15 0844 01/12/15 1510  WBC 3.7* 6.1  NEUTROABS 2.6  --   HGB 9.8* 7.7*  HCT 30.2* 23.8*  MCV 88.0 86.9  PLT 296 253   Cardiac Enzymes: No results for input(s): CKTOTAL, CKMB, CKMBINDEX, TROPONINI in the last 168 hours.  BNP (last 3 results) No  results for input(s): BNP in the last 8760 hours.  ProBNP (last 3 results) No results for input(s): PROBNP in the last 8760 hours.  CBG:  Recent Labs Lab 01/12/15 1510  GLUCAP 85    Radiological Exams on Admission: Nm Bone Scan Whole Body  01/11/2015  CLINICAL DATA:  Gastric malignancy. EXAM: NUCLEAR MEDICINE WHOLE BODY BONE SCAN TECHNIQUE: Whole body anterior and posterior images were obtained approximately 3 hours after intravenous injection of radiopharmaceutical. RADIOPHARMACEUTICALS:  25.5 MCi Technetium-28m MDP IV COMPARISON:  PET-CT study of September 16, 2014 FINDINGS: There is adequate uptake of the  radiopharmaceutical by the skeleton. Adequate soft tissue clearance and left renal activity is observed. The right kidney lies in the pelvis and activity within it is visible. Uptake within the mid cervical spine is consistent with degenerative change. Uptake within the thoracic and lumbar spine as well as the ribs and pectoral girdle is normal. There is increased uptake on the posterior images within the sacrum bilaterally. Activity within the iliac and pubic bones is normal. Activity within the lower extremities is normal. IMPRESSION: 1. There is increased uptake bilaterally within the sacrum which may reflect metastatic disease. Plain films of the pelvis with attention to the sacrum is recommended. 2. Activity in the mid cervical spine is likely degenerative in nature. Plain films are recommended. 3. The patient has a functioning right-sided pelvic kidney. Electronically Signed   By: David  Martinique M.D.   On: 01/11/2015 12:49    EKG: Independently reviewed. A-fib at 110 bpm- QT 534  Assessment/Plan Principal Problem:   Acute blood loss anemia due to upper GI bleed - hold Lovenox - 2 U PRBC ordered - check Hb every 12 hrs - cont Protonix - clear liquids for tonight  - as the bleeding is likely secondary to his known gastric cancer, hold off on GI consult unless bleeding does not  resolve  Active Problems:   Hypotension with h/o Essential hypertension - resume Coreg with holding parameters    Persistent atrial fibrillation  - currently rate controlled - resume Coreg- hold Lovenox    TxN1 adenocarcinoma of the gastric antrum - currently undergoing Chemo - being managed by Dr Alen Blew -cont Xeloda    Diabetes mellitus type II, non insulin dependent ([place]) - place on ISS- hold oral medications  Prolonged QT - follow- check Mg+   Consulted:   Code Status: Full code Family Communication:   DVT Prophylaxis:SCDs  Time spent: 52 min  Flanagan, MD [facility]  If 7PM-7AM, please contact night-coverage [email or web address] 01/12/2015, 5:29 PM

## 2015-01-12 NOTE — ED Notes (Signed)
Patient states he has complaits of diarrhea. States he can not give a number but has went a lot. Patient pressure on arrival 97/49.

## 2015-01-12 NOTE — ED Notes (Signed)
PT monitored by pulse ox, bp cuff, and 12-lead. 

## 2015-01-12 NOTE — ED Notes (Signed)
Patient comes today with complaints of dizziness states itial BP 82 patient has received 200cc of NS and BP 80. Patient alert and oriented on arrival. No complaints of pain. Patient is orthostatic per EMS. Patient has Hx of Afib and HR 70 with EMS.

## 2015-01-12 NOTE — ED Provider Notes (Signed)
CSN: [id number]     Arrival date & time 01/12/15  1430 History   First MD Initiated Contact with Patient 01/12/15 1509     Chief Complaint  Patient presents with  . Hypotension  . Dizziness     (Consider location/radiation/quality/duration/timing/severity/associated sxs/prior Treatment) HPI Comments: History of gastric carcinoma.  Atrial fib, on coumadin. PET scan on 01/11/15: increased uptake within the sacrum may reflect metastatic disease.  Patient is a 69 y.o. male presenting with dizziness. The history is provided by the patient and medical records. No language interpreter was used.  Dizziness Quality:  Lightheadedness Severity:  Moderate Onset quality:  Sudden Duration:  1 day Timing:  Constant Chronicity:  Recurrent Context: standing up   Worsened by:  Standing up Associated symptoms: blood in stool, diarrhea and weakness   Associated symptoms: no chest pain, no nausea, no shortness of breath and no vomiting   Risk factors: anemia     Past Medical History  Diagnosis Date  . Diabetes mellitus   . High blood pressure   . Stroke [facility]) 2008    residual left sided weakness  . Dyslipidemia   . Persistent atrial fibrillation ([place]) 01/14/2009    afib--cardioversion successful x1 shock; recurrent A. fib  . Bilateral cataracts 2014     status post extraction  . Stomach cancer ([place]) 10/21/13  . 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 Hernia repair    . Cardiac catheterization  01/15/2009    LV 40%, no occlusive coronary disease,norenal artery stenosis or abdmoninal aotric aneurysm  . Doppler echocardiography  10 /2012    EF normal -probably elevated left atrial pressures unable to asses due to afib.;mildly scelrotic aortic valve but  no stenosis and mildlyelevated right atrial  pressures and pulm. pressures at 45-50 mmhg with mild to mod pulm  htn no change from 2011  . Cataract extraction, bilateral  2014   . Biopsy stomach  10/21/13    adenocarcinoma   No family history on file. Social History  Substance Use Topics  . Smoking status: Former Smoker -- 1.00 packs/day for 45 years    Types: Cigarettes    Quit date: 11/21/2008  . Smokeless tobacco: Never Used  . Alcohol Use: No     Comment: quit drinking 6 years ago,    Review of Systems  Constitutional: Positive for fatigue.  Respiratory: Negative for shortness of breath.   Cardiovascular: Negative for chest pain.  Gastrointestinal: Positive for diarrhea and blood in stool. Negative for nausea, vomiting and abdominal pain.  Neurological: Positive for dizziness and weakness.  All other systems reviewed and are negative.     Allergies  Review of patient's allergies indicates no known allergies.  Home Medications   Prior to Admission medications   Medication Sig Start Date End Date Taking? Authorizing Provider  aspirin 81 MG EC tablet Take 81 mg by mouth at bedtime.     Historical Provider, MD  atorvastatin (LIPITOR) 10 MG tablet Take 10 mg by mouth daily.      Historical Provider, MD  capecitabine (XELODA) 500 MG tablet Take 2 tablets by mouth twice a day. 01/08/15   Wyatt Portela, MD  carvedilol (COREG) 3.125 MG tablet Take 1 tablet (3.125 mg total) by mouth 2 (two) times daily with a meal. 12/24/14   Allie Bossier, MD  enoxaparin (LOVENOX) 80 MG/0.8ML injection Inject 0.8 mLs (80 mg total) into the skin every 12 (twelve)  hours. 12/24/14   Allie Bossier, MD  ferrous sulfate 325 (65 FE) MG tablet Take 325 mg by mouth 2 (two) times daily with a meal.  09/26/14   Historical Provider, MD  glipiZIDE (GLUCOTROL XL) 5 MG 24 hr tablet Take 5 mg by mouth 2 (two) times daily.    Historical Provider, MD  loperamide (IMODIUM A-D) 2 MG tablet Take 2 mg by mouth 4 (four) times daily as needed for diarrhea or loose stools.    Historical Provider, MD  metFORMIN (GLUCOPHAGE) 1000 MG tablet Take 1,000 mg by mouth 2 (two) times daily with a meal.   08/19/14   Historical Provider, MD  pantoprazole (PROTONIX) 40 MG tablet Take 1 tablet (40 mg total) by mouth daily. 12/24/14   Allie Bossier, MD  PROAIR HFA 108 (90 BASE) MCG/ACT inhaler Inhale 2 puffs into the lungs 4 (four) times daily as needed for wheezing or shortness of breath.  09/22/13   Historical Provider, MD   BP 97/49 mmHg  Pulse 80  Temp(Src) 98 F (36.7 C)  Resp 12  SpO2 94% Physical Exam  Constitutional: He is oriented to person, place, and time. He appears well-developed and well-nourished.  HENT:  Mouth/Throat: Oropharynx is clear and moist.  Eyes: Pupils are equal, round, and reactive to light.  Neck: Neck supple.  Cardiovascular: Intact distal pulses.  An irregularly irregular rhythm present.  Pulmonary/Chest: Effort normal and breath sounds normal.  Abdominal: Bowel sounds are normal. He exhibits no distension. There is no tenderness.  Musculoskeletal: He exhibits no edema or tenderness.  Lymphadenopathy:    He has no cervical adenopathy.  Neurological: He is alert and oriented to person, place, and time.  Skin: Skin is warm and dry.  Psychiatric: He has a normal mood and affect.    ED Course  Procedures (including critical care time) Labs Review Labs Reviewed  CBC - Abnormal; Notable for the following:    RBC 2.74 (*)    Hemoglobin 7.7 (*)    HCT 23.8 (*)    RDW 20.4 (*)    All other components within normal limits  BASIC METABOLIC PANEL  URINALYSIS, ROUTINE W REFLEX MICROSCOPIC (NOT AT [facility])  OCCULT BLOOD X 1 CARD TO LAB, STOOL  CBG MONITORING, ED    Imaging Review Nm Bone Scan Whole Body  01/11/2015  CLINICAL DATA:  Gastric malignancy. EXAM: NUCLEAR MEDICINE WHOLE BODY BONE SCAN TECHNIQUE: Whole body anterior and posterior images were obtained approximately 3 hours after intravenous injection of radiopharmaceutical. RADIOPHARMACEUTICALS:  25.5 MCi Technetium-83m MDP IV COMPARISON:  PET-CT study of September 16, 2014 FINDINGS: There is adequate uptake of  the radiopharmaceutical by the skeleton. Adequate soft tissue clearance and left renal activity is observed. The right kidney lies in the pelvis and activity within it is visible. Uptake within the mid cervical spine is consistent with degenerative change. Uptake within the thoracic and lumbar spine as well as the ribs and pectoral girdle is normal. There is increased uptake on the posterior images within the sacrum bilaterally. Activity within the iliac and pubic bones is normal. Activity within the lower extremities is normal. IMPRESSION: 1. There is increased uptake bilaterally within the sacrum which may reflect metastatic disease. Plain films of the pelvis with attention to the sacrum is recommended. 2. Activity in the mid cervical spine is likely degenerative in nature. Plain films are recommended. 3. The patient has a functioning right-sided pelvic kidney. Electronically Signed   By: Fleming Prill  [place] M.D.  On: 01/11/2015 12:49   I have personally reviewed and evaluated these images and lab results as part of my medical decision-making.   EKG Interpretation None     Lab and radiology results reviewed and shared with patient. Patient discussed with and seen by Dr. Tomi Bamberger.  Patient with known gastric carcinoma presents today with return of dark, tarry, diarrheal stools with increasing weakness and dizziness with standing. Positive fecal hemoccult. 2.2 mg drop in hemoglobin since yesterday.  IV fluids, 2 units PRBC in ED. Admitted to hospitalist service. MDM   Final diagnoses:  None    Upper GI bleed.    Etta Quill, NP 01/13/15 0100  Dorie Rank, MD 01/13/15 1102

## 2015-01-13 ENCOUNTER — Ambulatory Visit: Payer: Medicare Other | Admitting: Oncology

## 2015-01-13 DIAGNOSIS — E119 Type 2 diabetes mellitus without complications: Secondary | ICD-10-CM

## 2015-01-13 DIAGNOSIS — K922 Gastrointestinal hemorrhage, unspecified: Secondary | ICD-10-CM

## 2015-01-13 DIAGNOSIS — I481 Persistent atrial fibrillation: Secondary | ICD-10-CM

## 2015-01-13 DIAGNOSIS — D62 Acute posthemorrhagic anemia: Secondary | ICD-10-CM

## 2015-01-13 DIAGNOSIS — C163 Malignant neoplasm of pyloric antrum: Principal | ICD-10-CM

## 2015-01-13 DIAGNOSIS — I10 Essential (primary) hypertension: Secondary | ICD-10-CM

## 2015-01-13 LAB — RETICULOCYTES
RBC.: 3.2 MIL/uL — ABNORMAL LOW (ref 4.22–5.81)
Retic Count, Absolute: 28.8 10*3/uL (ref 19.0–186.0)
Retic Ct Pct: 0.9 % (ref 0.4–3.1)

## 2015-01-13 LAB — BASIC METABOLIC PANEL
ANION GAP: 9 (ref 5–15)
BUN: 35 mg/dL — ABNORMAL HIGH (ref 6–20)
CALCIUM: 8.7 mg/dL — AB (ref 8.9–10.3)
CHLORIDE: 107 mmol/L (ref 101–111)
CO2: 22 mmol/L (ref 22–32)
Creatinine, Ser: 0.96 mg/dL (ref 0.61–1.24)
GFR calc Af Amer: >60 mL/min (ref >60)
GFR calc non Af Amer: >60 mL/min (ref >60)
GLUCOSE: 108 mg/dL — AB (ref 65–99)
Potassium: 3.7 mmol/L (ref 3.5–5.1)
Sodium: 138 mmol/L (ref 135–145)

## 2015-01-13 LAB — FOLATE: FOLATE: 10.7 ng/mL (ref >5.9)

## 2015-01-13 LAB — PROTIME-INR
INR: 1.06 (ref 0.00–1.49)
Prothrombin Time: 14 seconds (ref 11.6–15.2)

## 2015-01-13 LAB — FERRITIN: Ferritin: 32 ng/mL (ref 24–336)

## 2015-01-13 LAB — IRON AND TIBC
Iron: 324 ug/dL — ABNORMAL HIGH (ref 45–182)
SATURATION RATIOS: 86 % — AB (ref 17.9–39.5)
TIBC: 378 ug/dL (ref 250–450)
UIBC: 54 ug/dL

## 2015-01-13 LAB — GLUCOSE, CAPILLARY
GLUCOSE-CAPILLARY: 166 mg/dL — AB (ref 65–99)
GLUCOSE-CAPILLARY: 118 mg/dL — AB (ref 65–99)
GLUCOSE-CAPILLARY: 146 mg/dL — AB (ref 65–99)
Glucose-Capillary: 101 mg/dL — ABNORMAL HIGH (ref 65–99)

## 2015-01-13 LAB — CBC
HEMATOCRIT: 28.4 % — AB (ref 39.0–52.0)
HEMATOCRIT: 29.2 % — AB (ref 39.0–52.0)
HEMOGLOBIN: 9.3 g/dL — AB (ref 13.0–17.0)
HEMOGLOBIN: 9.9 g/dL — AB (ref 13.0–17.0)
MCH: 28.3 pg (ref 26.0–34.0)
MCH: 29.1 pg (ref 26.0–34.0)
MCHC: 32.7 g/dL (ref 30.0–36.0)
MCHC: 33.9 g/dL (ref 30.0–36.0)
MCV: 86.3 fL (ref 78.0–100.0)
MCV: 85.9 fL (ref 78.0–100.0)
Platelets: 217 10*3/uL (ref 150–400)
Platelets: 132 10*3/uL — ABNORMAL LOW (ref 150–400)
RBC: 3.29 MIL/uL — ABNORMAL LOW (ref 4.22–5.81)
RBC: 3.4 MIL/uL — ABNORMAL LOW (ref 4.22–5.81)
RDW: 18.1 % — ABNORMAL HIGH (ref 11.5–15.5)
RDW: 18.7 % — ABNORMAL HIGH (ref 11.5–15.5)
WBC: 4.2 10*3/uL (ref 4.0–10.5)
WBC: 4.1 10*3/uL (ref 4.0–10.5)

## 2015-01-13 LAB — VITAMIN B12: VITAMIN B 12: 180 pg/mL (ref 180–914)

## 2015-01-13 MED ORDER — PANTOPRAZOLE SODIUM 40 MG IV SOLR
40.0000 mg | Freq: Two times a day (BID) | INTRAVENOUS | Status: DC
  Administered 2015-01-13 – 2015-01-15 (×5): 40 mg via INTRAVENOUS
  Filled 2015-01-13 (×5): qty 40

## 2015-01-13 MED ORDER — ALBUTEROL SULFATE (2.5 MG/3ML) 0.083% IN NEBU: 2.5000 mg | INHALATION_SOLUTION | RESPIRATORY_TRACT | Status: DC | PRN

## 2015-01-13 NOTE — Consult Note (Signed)
Reason for Consult: GI bleed and gastric adenocarcinoma Referring Physician: [facility]  Velia Meyer HPI: This is a 69 year old male with a PMH of gastric adenocarcinoma diagnosed on 10/21/2013 during an evaluation for a microcytic anemia, afib on coumadin, and HTN admitted for symptomatic anemia.  He was recently discharged in October from a GI bleed presumed to be from his gastric cancer, which was exacerbated by his use of coumadin.  His INR was corrected and he was transfused from the 5 range up to the 9 range.  His melena recurred and he felt weak.  His HGB upon admission was at 7.7 g/dL.  He was treated with radiation, but his most recent PET scan on 09/2014 did reveal metastatic disease in the gastrohepatic nodes as well as likely boney mets to the sacrum.  As a result of the progression he was started on Xeloda.  Past Medical History  Diagnosis Date  . High blood pressure   . Dyslipidemia   . Persistent atrial fibrillation ([place]) 01/14/2009    afib--cardioversion successful x1 shock; recurrent A. fib  . 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 Type II diabetes mellitus ([place])   . Anemia   . History of blood transfusion 12/2014; 01/12/2015    "related to low HgB; related to low HgB" (01/12/2015)  . Upper GI bleed 12/2014; 01/12/2015  . Stroke [facility]) 2009    denies residual on 01/12/2015    Past Surgical History  Procedure Laterality Date  . Cardiac catheterization  01/15/2009    LV 40%, no occlusive coronary disease,norenal artery stenosis or abdmoninal aotric aneurysm  . Doppler echocardiography  10 /2012    EF normal -probably elevated left atrial pressures unable to asses due to afib.;mildly scelrotic aortic valve but  no stenosis and mildlyelevated right atrial  pressures and pulm. pressures at 45-50 mmhg with mild to mod pulm  htn no change from 2011  . Cataract extraction w/ intraocular lens  implant, bilateral  2014  . Biopsy stomach  10/21/13   [phone]  . Inguinal hernia repair Bilateral     History reviewed. No pertinent family history.  Social History:  reports that he quit smoking about 6 years ago. His smoking use included Cigarettes. He has a 45 pack-year smoking history. He has never used smokeless tobacco. He reports that he drinks alcohol. He reports that he does not use illicit drugs.  Allergies: No Known Allergies  Medications:  Scheduled: . aspirin EC  81 mg Oral QHS  . atorvastatin  10 mg Oral Daily  . capecitabine  1,000 mg Oral BID PC  . carvedilol  3.125 mg Oral BID WC  . docusate sodium  100 mg Oral BID  . insulin aspart  0-5 Units Subcutaneous QHS  . insulin aspart  0-9 Units Subcutaneous TID WC   Continuous:   Results for orders placed or performed during the hospital encounter of 01/12/15 (from the past 24 hour(s))  Basic metabolic panel     Status: Abnormal   Collection Time: 01/12/15  3:10 PM  Result Value Ref Range   Sodium 140 135 - 145 mmol/L   Potassium 4.1 3.5 - 5.1 mmol/L   Chloride 107 101 - 111 mmol/L   CO2 21 (L) 22 - 32 mmol/L   Glucose, Bld 94 65 - 99 mg/dL   BUN 41 (H) 6 - 20 mg/dL   Creatinine, Ser 1.08 0.61 - 1.24 mg/dL   Calcium 8.7 (L) 8.9 - 10.3 mg/dL  GFR calc non Af Amer >60 >60 mL/min   GFR calc Af Amer >60 >60 mL/min   Anion gap 12 5 - 15  CBC     Status: Abnormal   Collection Time: 01/12/15  3:10 PM  Result Value Ref Range   WBC 6.1 4.0 - 10.5 K/uL   RBC 2.74 (L) 4.22 - 5.81 MIL/uL   Hemoglobin 7.7 (L) 13.0 - 17.0 g/dL   HCT 23.8 (L) 39.0 - 52.0 %   MCV 86.9 78.0 - 100.0 fL   MCH 28.1 26.0 - 34.0 pg   MCHC 32.4 30.0 - 36.0 g/dL   RDW 20.4 (H) 11.5 - 15.5 %   Platelets 253 150 - 400 K/uL  CBG monitoring, ED     Status: None   Collection Time: 01/12/15  3:10 PM  Result Value Ref Range   Glucose-Capillary 85 65 - 99 mg/dL  POC occult blood, ED     Status: Abnormal   Collection Time: 01/12/15  3:27 PM  Result Value Ref Range   Fecal Occult Bld POSITIVE (A)  NEGATIVE  Protime-INR     Status: None   Collection Time: 01/12/15  5:30 PM  Result Value Ref Range   Prothrombin Time 15.1 11.6 - 15.2 seconds   INR 1.17 0.00 - 1.49  PTT     Status: None   Collection Time: 01/12/15  5:30 PM  Result Value Ref Range   aPTT 33 24 - 37 seconds  Type and screen [place]     Status: None (Preliminary result)   Collection Time: 01/12/15  5:50 PM  Result Value Ref Range   ABO/RH(D) A POS    Antibody Screen NEG    Sample Expiration 01/15/2015    Unit Number [id number]    Blood Component Type RED CELLS,LR    Unit division 00    Status of Unit ISSUED    Transfusion Status OK TO TRANSFUSE    Crossmatch Result Compatible    Unit Number [id number]    Blood Component Type RED CELLS,LR    Unit division 00    Status of Unit ISSUED,FINAL    Transfusion Status OK TO TRANSFUSE    Crossmatch Result Compatible   Prepare RBC     Status: None   Collection Time: 01/12/15  5:50 PM  Result Value Ref Range   Order Confirmation ORDER PROCESSED BY BLOOD BANK   CBC     Status: Abnormal   Collection Time: 01/12/15  7:10 PM  Result Value Ref Range   WBC 4.9 4.0 - 10.5 K/uL   RBC 2.72 (L) 4.22 - 5.81 MIL/uL   Hemoglobin 7.6 (L) 13.0 - 17.0 g/dL   HCT 23.6 (L) 39.0 - 52.0 %   MCV 86.8 78.0 - 100.0 fL   MCH 27.9 26.0 - 34.0 pg   MCHC 32.2 30.0 - 36.0 g/dL   RDW 20.3 (H) 11.5 - 15.5 %   Platelets 268 150 - 400 K/uL  Magnesium     Status: None   Collection Time: 01/12/15  7:10 PM  Result Value Ref Range   Magnesium 1.8 1.7 - 2.4 mg/dL  Glucose, capillary     Status: None   Collection Time: 01/12/15  9:50 PM  Result Value Ref Range   Glucose-Capillary 82 65 - 99 mg/dL  Basic metabolic panel     Status: Abnormal   Collection Time: 01/13/15  5:41 AM  Result Value Ref Range   Sodium 138 135 - 145 mmol/L  Potassium 3.7 3.5 - 5.1 mmol/L   Chloride 107 101 - 111 mmol/L   CO2 22 22 - 32 mmol/L   Glucose, Bld 108 (H) 65 - 99 mg/dL   BUN 35  (H) 6 - 20 mg/dL   Creatinine, Ser 0.96 0.61 - 1.24 mg/dL   Calcium 8.7 (L) 8.9 - 10.3 mg/dL   GFR calc non Af Amer >60 >60 mL/min   GFR calc Af Amer >60 >60 mL/min   Anion gap 9 5 - 15  CBC     Status: Abnormal   Collection Time: 01/13/15  5:41 AM  Result Value Ref Range   WBC 4.2 4.0 - 10.5 K/uL   RBC 3.29 (L) 4.22 - 5.81 MIL/uL   Hemoglobin 9.3 (L) 13.0 - 17.0 g/dL   HCT 28.4 (L) 39.0 - 52.0 %   MCV 86.3 78.0 - 100.0 fL   MCH 28.3 26.0 - 34.0 pg   MCHC 32.7 30.0 - 36.0 g/dL   RDW 18.1 (H) 11.5 - 15.5 %   Platelets 217 150 - 400 K/uL  Protime-INR     Status: None   Collection Time: 01/13/15  5:41 AM  Result Value Ref Range   Prothrombin Time 14.0 11.6 - 15.2 seconds   INR 1.06 0.00 - 1.49  Glucose, capillary     Status: Abnormal   Collection Time: 01/13/15  7:44 AM  Result Value Ref Range   Glucose-Capillary 101 (H) 65 - 99 mg/dL  Glucose, capillary     Status: Abnormal   Collection Time: 01/13/15 12:28 PM  Result Value Ref Range   Glucose-Capillary 166 (H) 65 - 99 mg/dL     No results found.  ROS:  As stated above in the HPI otherwise negative.  Blood pressure 93/61, pulse 75, temperature 98.4 F (36.9 C), temperature source Oral, resp. rate 18, SpO2 100 %.    PE: Gen: NAD, Alert and Oriented HEENT:  [place]/AT, EOMI Neck: Supple, no LAD Lungs: CTA Bilaterally CV: RRR without M/G/R ABM: Soft, NTND, +BS Ext: No C/C/E  Assessment/Plan: 1) Gastric adenocarcinoma. 2) GI bleed. 3) Afib.   I will perform an EGD tomorrow and ablate any sites of bleeding.  With his significant GI bleed and his gastric cancer, the benefits will most likely be higher if he stops coumadin or any anticoagulation completely.  It appears that his GI bleeding poses more of a life threatening risk than stroke prevention from his afib.  Plan: 1) EGD tomorrow.  Delita Chiquito D 01/13/2015, 12:58 PM

## 2015-01-13 NOTE — Care Management Note (Signed)
Case Management Note  Patient Details  Name: Patrick [name] MRN: [id number] Date of Birth: 1945-05-24  Subjective/Objective:                  Date-01-13-15 Initial Assessment Spoke with patient at the bedside Introduced self as case manager and explained role in discharge planning and how to be reached.  Verified patient lives in [place] in apartment with son. Patient has to take 15 steps to get to apartment.  Verified patient anticipates to go home with family at time of discharge and will have  "most of the time" supervision by son  Patient has DME walking stick for long walks. Expressed potential need for no other DME.  Patient denied needing help with their medication.  Patient is driven by son to MD appointments.  Verified patient has PCP Dr Trilby Drummer Plan: CM will continue to follow for discharge planning and [facility] Acute Care resources.   Carles Collet RN BSN CM [phone]   Action/Plan:   Expected Discharge Date:                  Expected Discharge Plan:  Home/Self Care  In-House Referral:     Discharge planning Services  CM Consult  Post Acute Care Choice:    Choice offered to:     DME Arranged:    DME Agency:     HH Arranged:    HH Agency:     Status of Service:  In process, will continue to follow  Medicare Important Message Given:    Date Medicare IM Given:    Medicare IM give by:    Date Additional Medicare IM Given:    Additional Medicare Important Message give by:     If discussed at [place] of Stay Meetings, dates discussed:    Additional Comments:  Carles Collet, RN 01/13/2015, 3:25 PM

## 2015-01-13 NOTE — Progress Notes (Signed)
Dr. Sloan Leiter aware of pt having periods of tachy HR 141. Hx of a-fib. No distress.

## 2015-01-13 NOTE — Progress Notes (Signed)
PATIENT DETAILS Name: Patrick [name] Age: 69 y.o. Sex: male Date of Birth: April 03, 1945 Admit Date: 01/12/2015 Admitting Physician Debbe Odea, MD OZH:YQMVHQION,GEXBM, MD  Brief narrative:  69 year old male with history of gastric ulcer-currently on chemotherapy-status post radiation last year-history of atrial fibrillation-on Lovenox and aspirin prior to this admission-presented to the ED with melena and acute blood loss anemia. Similar admission a few weeks back.  Subjective: No further melena  Assessment/Plan: Principal Problem: Upper GI bleeding: No further melena, has history of gastric cancer-since patient had similar admission a few weeks back-and on anticoagulation for atrial fibrillation-have consulted gastroenterology for EGD. Discontinue aspirin, start PPI andFollow clinical course-continue to hold Lovenox.  Active Problems: Acute blood loss anemia: Secondary to above, transfused 2 units of PRBC on admission-hemoglobin stable at 9.3. Check anemia panel-suspect may need IV iron infusion.  Atrial fibrillation: Remains in atrial fibrillation-but rate controlled with Coreg.On Lovenox prior to admission-will continue to hold-suspect may not be a great candidate for anticoagulation-but await GI evaluation and EGD. CHA2DS2-VASc score 5.  Essential hypertension: Continue Coreg-appears controlled  Type 2 diabetes: CBGs stable, continue SSI-resume metformin and glipizide on discharge  History of gastric cancer: Follows with oncology-on Xeloda. Is status post radiation therapy.  History of bronchial asthma: Appears stable-clear lungs-continue as needed bronchitis  ? Prolonged QTC: Recheck EKG today-EKG poor quality yesterday. If still prolonged-Will supplement K and magnesium.  Disposition: Remain inpatient  Antimicrobial agents  See below  Anti-infectives    None      DVT Prophylaxis: SCD's  Code Status: Full code   Family Communication None at  bedside  Procedures:   CONSULTS:  GI  Time spent 30 minutes-Greater than 50% of this time was spent in counseling, explanation of diagnosis, planning of further management, and coordination of care.  MEDICATIONS: Scheduled Meds: . aspirin EC  81 mg Oral QHS  . atorvastatin  10 mg Oral Daily  . capecitabine  1,000 mg Oral BID PC  . carvedilol  3.125 mg Oral BID WC  . docusate sodium  100 mg Oral BID  . insulin aspart  0-5 Units Subcutaneous QHS  . insulin aspart  0-9 Units Subcutaneous TID WC   Continuous Infusions:  PRN Meds:.acetaminophen **OR** acetaminophen, ondansetron **OR** ondansetron (ZOFRAN) IV    PHYSICAL EXAM: Vital signs in last 24 hours: Filed Vitals:   01/13/15 0136 01/13/15 0400 01/13/15 0402 01/13/15 0555  BP: 104/65 108/68 105/57 93/61  Pulse: 71 72 65 75  Temp: 98.9 F (37.2 C) 98.6 F (37 C) 98.7 F (37.1 C) 98.4 F (36.9 C)  TempSrc: Oral Oral Oral Oral  Resp: 16 16  18   SpO2:    100%    Weight change:  There were no vitals filed for this visit. There is no weight on file to calculate BMI.   Gen Exam: Awake and alert with clear speech. Neck: Supple, No JVD.   Chest: B/L Clear.   CVS: S1 S2 Regular, no murmurs.  Abdomen: soft, BS +, non tender, non distended.  Extremities: no edema, lower extremities warm to touch. Neurologic: Non Focal.   Skin: No Rash.   Wounds: N/A.    Intake/Output from previous day:  Intake/Output Summary (Last 24 hours) at 01/13/15 1252 Last data filed at 01/13/15 0904  Gross per 24 hour  Intake   1305 ml  Output      0 ml  Net   1305 ml  LAB RESULTS: CBC  Recent Labs Lab 01/11/15 0844 01/12/15 1510 01/12/15 1910 01/13/15 0541  WBC 3.7* 6.1 4.9 4.2  HGB 9.8* 7.7* 7.6* 9.3*  HCT 30.2* 23.8* 23.6* 28.4*  PLT 296 253 268 217  MCV 88.0 86.9 86.8 86.3  MCH 28.6 28.1 27.9 28.3  MCHC 32.5 32.4 32.2 32.7  RDW 20.3* 20.4* 20.3* 18.1*  LYMPHSABS 0.6*  --   --   --   MONOABS 0.4  --   --   --     EOSABS 0.1  --   --   --   BASOSABS 0.0  --   --   --     Chemistries   Recent Labs Lab 01/11/15 0844 01/12/15 1510 01/12/15 1910 01/13/15 0541  NA 141 140  --  138  K 3.7 4.1  --  3.7  CL  --  107  --  107  CO2 28 21*  --  22  GLUCOSE 101 94  --  108*  BUN 15.9 41*  --  35*  CREATININE 1.0 1.08  --  0.96  CALCIUM 9.4 8.7*  --  8.7*  MG  --   --  1.8  --     CBG:  Recent Labs Lab 01/12/15 1510 01/12/15 2150 01/13/15 0744 01/13/15 1228  GLUCAP 85 82 101* 166*    GFR CrCl cannot be calculated (Unknown ideal weight.).  Coagulation profile  Recent Labs Lab 01/12/15 1730 01/13/15 0541  INR 1.17 1.06    Cardiac Enzymes No results for input(s): CKMB, TROPONINI, MYOGLOBIN in the last 168 hours.  Invalid input(s): CK  Invalid input(s): POCBNP No results for input(s): DDIMER in the last 72 hours. No results for input(s): HGBA1C in the last 72 hours. No results for input(s): CHOL, HDL, LDLCALC, TRIG, CHOLHDL, LDLDIRECT in the last 72 hours. No results for input(s): TSH, T4TOTAL, T3FREE, THYROIDAB in the last 72 hours.  Invalid input(s): FREET3 No results for input(s): VITAMINB12, FOLATE, FERRITIN, TIBC, IRON, RETICCTPCT in the last 72 hours. No results for input(s): LIPASE, AMYLASE in the last 72 hours.  Urine Studies No results for input(s): UHGB, CRYS in the last 72 hours.  Invalid input(s): UACOL, UAPR, USPG, UPH, UTP, UGL, UKET, UBIL, UNIT, UROB, ULEU, UEPI, UWBC, URBC, UBAC, CAST, UCOM, BILUA  MICROBIOLOGY: No results found for this or any previous visit (from the past 240 hour(s)).  RADIOLOGY STUDIES/RESULTS: Nm Bone Scan Whole Body  01/11/2015  CLINICAL DATA:  Gastric malignancy. EXAM: NUCLEAR MEDICINE WHOLE BODY BONE SCAN TECHNIQUE: Whole body anterior and posterior images were obtained approximately 3 hours after intravenous injection of radiopharmaceutical. RADIOPHARMACEUTICALS:  25.5 MCi Technetium-33m MDP IV COMPARISON:  PET-CT study of September 16, 2014 FINDINGS: There is adequate uptake of the radiopharmaceutical by the skeleton. Adequate soft tissue clearance and left renal activity is observed. The right kidney lies in the pelvis and activity within it is visible. Uptake within the mid cervical spine is consistent with degenerative change. Uptake within the thoracic and lumbar spine as well as the ribs and pectoral girdle is normal. There is increased uptake on the posterior images within the sacrum bilaterally. Activity within the iliac and pubic bones is normal. Activity within the lower extremities is normal. IMPRESSION: 1. There is increased uptake bilaterally within the sacrum which may reflect metastatic disease. Plain films of the pelvis with attention to the sacrum is recommended. 2. Activity in the mid cervical spine is likely degenerative in nature. Plain films are recommended. 3.  The patient has a functioning right-sided pelvic kidney. Electronically Signed   By: David  Martin[place] M.D.   On: 01/11/2015 12:49   Dg Chest Port 1 View  12/20/2014  CLINICAL DATA:  Weakness, history of atrial fibrillation EXAM: PORTABLE CHEST 1 VIEW COMPARISON:  09/16/2014 FINDINGS: Borderline cardiomegaly. No acute infiltrate or pleural effusion. No pulmonary edema. Bony thorax is unremarkable. IMPRESSION: No active disease. Electronically Signed   By: Lahoma Crocker M.D.   On: 12/20/2014 12:19    Oren Binet, MD  [facility] Pager:[phone]  If 7PM-7AM, please contact night-coverage [email or web address] Password [email or web address] 01/13/2015, 12:52 PM   LOS: 1 day

## 2015-01-14 ENCOUNTER — Encounter (HOSPITAL_COMMUNITY): Payer: Self-pay

## 2015-01-14 ENCOUNTER — Encounter (HOSPITAL_COMMUNITY)
Admission: EM | Disposition: A | Discharge status: Home / Self Care | Payer: Self-pay | Source: Home / Self Care | Attending: Internal Medicine

## 2015-01-14 HISTORY — PX: ESOPHAGOGASTRODUODENOSCOPY: SHX5428

## 2015-01-14 LAB — TYPE AND SCREEN
ABO/RH(D): A POS
ANTIBODY SCREEN: NEGATIVE
Unit division: 0
Unit division: 0

## 2015-01-14 LAB — CBC
HCT: 28.6 % — ABNORMAL LOW (ref 39.0–52.0)
HEMOGLOBIN: 9.4 g/dL — AB (ref 13.0–17.0)
MCH: 28.8 pg (ref 26.0–34.0)
MCHC: 32.9 g/dL (ref 30.0–36.0)
MCV: 87.7 fL (ref 78.0–100.0)
PLATELETS: 181 10*3/uL (ref 150–400)
RBC: 3.26 MIL/uL — AB (ref 4.22–5.81)
RDW: 19.1 % — ABNORMAL HIGH (ref 11.5–15.5)
WBC: 4.2 10*3/uL (ref 4.0–10.5)

## 2015-01-14 LAB — GLUCOSE, CAPILLARY
GLUCOSE-CAPILLARY: 84 mg/dL (ref 65–99)
GLUCOSE-CAPILLARY: 211 mg/dL — AB (ref 65–99)
Glucose-Capillary: 121 mg/dL — ABNORMAL HIGH (ref 65–99)
Glucose-Capillary: 106 mg/dL — ABNORMAL HIGH (ref 65–99)

## 2015-01-14 SURGERY — EGD (ESOPHAGOGASTRODUODENOSCOPY)
Anesthesia: Moderate Sedation

## 2015-01-14 MED ORDER — MIDAZOLAM HCL 10 MG/2ML IJ SOLN
INTRAMUSCULAR | Status: DC | PRN
  Administered 2015-01-14: 2 mg via INTRAVENOUS

## 2015-01-14 MED ORDER — FENTANYL CITRATE (PF) 100 MCG/2ML IJ SOLN
INTRAMUSCULAR | Status: AC
  Filled 2015-01-14: qty 2

## 2015-01-14 MED ORDER — MIDAZOLAM HCL 5 MG/ML IJ SOLN
INTRAMUSCULAR | Status: AC
  Filled 2015-01-14: qty 2

## 2015-01-14 MED ORDER — DIPHENHYDRAMINE HCL 50 MG/ML IJ SOLN
INTRAMUSCULAR | Status: AC
  Filled 2015-01-14: qty 1

## 2015-01-14 MED ORDER — FENTANYL CITRATE (PF) 100 MCG/2ML IJ SOLN
INTRAMUSCULAR | Status: DC | PRN
  Administered 2015-01-14: 25 ug via INTRAVENOUS

## 2015-01-14 NOTE — H&P (View-Only) (Signed)
Reason for Consult: GI bleed and gastric adenocarcinoma Referring Physician: [facility]  Patrick [name] HPI: This is a 69 year old male with a PMH of gastric adenocarcinoma diagnosed on 10/21/2013 during an evaluation for a microcytic anemia, afib on coumadin, and HTN admitted for symptomatic anemia.  He was recently discharged in October from a GI bleed presumed to be from his gastric cancer, which was exacerbated by his use of coumadin.  His INR was corrected and he was transfused from the 5 range up to the 9 range.  His melena recurred and he felt weak.  His HGB upon admission was at 7.7 g/dL.  He was treated with radiation, but his most recent PET scan on 09/2014 did reveal metastatic disease in the gastrohepatic nodes as well as likely boney mets to the sacrum.  As a result of the progression he was started on Xeloda.  Past Medical History  Diagnosis Date  . High blood pressure   . Dyslipidemia   . Persistent atrial fibrillation (HCC) 01/14/2009    afib--cardioversion successful x1 shock; recurrent A. fib  . 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 Type II diabetes mellitus (HCC)   . Anemia   . History of blood transfusion 12/2014; 01/12/2015    "related to low HgB; related to low HgB" (01/12/2015)  . Upper GI bleed 12/2014; 01/12/2015  . Stroke (HCC) 2009    denies residual on 01/12/2015    Past Surgical History  Procedure Laterality Date  . Cardiac catheterization  01/15/2009    LV 40%, no occlusive coronary disease,norenal artery stenosis or abdmoninal aotric aneurysm  . Doppler echocardiography  10 /2012    EF normal -probably elevated left atrial pressures unable to asses due to afib.;mildly scelrotic aortic valve but  no stenosis and mildlyelevated right atrial  pressures and pulm. pressures at 45-50 mmhg with mild to mod pulm  htn no change from 2011  . Cataract extraction w/ intraocular lens  implant, bilateral  2014  . Biopsy stomach  10/21/13   [phone]  . Inguinal hernia repair Bilateral     History reviewed. No pertinent family history.  Social History:  reports that he quit smoking about 6 years ago. His smoking use included Cigarettes. He has a 45 pack-year smoking history. He has never used smokeless tobacco. He reports that he drinks alcohol. He reports that he does not use illicit drugs.  Allergies: No Known Allergies  Medications:  Scheduled: . aspirin EC  81 mg Oral QHS  . atorvastatin  10 mg Oral Daily  . capecitabine  1,000 mg Oral BID PC  . carvedilol  3.125 mg Oral BID WC  . docusate sodium  100 mg Oral BID  . insulin aspart  0-5 Units Subcutaneous QHS  . insulin aspart  0-9 Units Subcutaneous TID WC   Continuous:   Results for orders placed or performed during the hospital encounter of 01/12/15 (from the past 24 hour(s))  Basic metabolic panel     Status: Abnormal   Collection Time: 01/12/15  3:10 PM  Result Value Ref Range   Sodium 140 135 - 145 mmol/L   Potassium 4.1 3.5 - 5.1 mmol/L   Chloride 107 101 - 111 mmol/L   CO2 21 (L) 22 - 32 mmol/L   Glucose, Bld 94 65 - 99 mg/dL   BUN 41 (H) 6 - 20 mg/dL   Creatinine, Ser 1.08 0.61 - 1.24 mg/dL   Calcium 8.7 (L) 8.9 - 10.3 mg/dL     GFR calc non Af Amer >60 >60 mL/min   GFR calc Af Amer >60 >60 mL/min   Anion gap 12 5 - 15  CBC     Status: Abnormal   Collection Time: 01/12/15  3:10 PM  Result Value Ref Range   WBC 6.1 4.0 - 10.5 K/uL   RBC 2.74 (L) 4.22 - 5.81 MIL/uL   Hemoglobin 7.7 (L) 13.0 - 17.0 g/dL   HCT 23.8 (L) 39.0 - 52.0 %   MCV 86.9 78.0 - 100.0 fL   MCH 28.1 26.0 - 34.0 pg   MCHC 32.4 30.0 - 36.0 g/dL   RDW 20.4 (H) 11.5 - 15.5 %   Platelets 253 150 - 400 K/uL  CBG monitoring, ED     Status: None   Collection Time: 01/12/15  3:10 PM  Result Value Ref Range   Glucose-Capillary 85 65 - 99 mg/dL  POC occult blood, ED     Status: Abnormal   Collection Time: 01/12/15  3:27 PM  Result Value Ref Range   Fecal Occult Bld POSITIVE (A)  NEGATIVE  Protime-INR     Status: None   Collection Time: 01/12/15  5:30 PM  Result Value Ref Range   Prothrombin Time 15.1 11.6 - 15.2 seconds   INR 1.17 0.00 - 1.49  PTT     Status: None   Collection Time: 01/12/15  5:30 PM  Result Value Ref Range   aPTT 33 24 - 37 seconds  Type and screen [facility]     Status: None (Preliminary result)   Collection Time: 01/12/15  5:50 PM  Result Value Ref Range   ABO/RH(D) A POS    Antibody Screen NEG    Sample Expiration 01/15/2015    Unit Number [id number]    Blood Component Type RED CELLS,LR    Unit division 00    Status of Unit ISSUED    Transfusion Status OK TO TRANSFUSE    Crossmatch Result Compatible    Unit Number [id number]    Blood Component Type RED CELLS,LR    Unit division 00    Status of Unit ISSUED,FINAL    Transfusion Status OK TO TRANSFUSE    Crossmatch Result Compatible   Prepare RBC     Status: None   Collection Time: 01/12/15  5:50 PM  Result Value Ref Range   Order Confirmation ORDER PROCESSED BY BLOOD BANK   CBC     Status: Abnormal   Collection Time: 01/12/15  7:10 PM  Result Value Ref Range   WBC 4.9 4.0 - 10.5 K/uL   RBC 2.72 (L) 4.22 - 5.81 MIL/uL   Hemoglobin 7.6 (L) 13.0 - 17.0 g/dL   HCT 23.6 (L) 39.0 - 52.0 %   MCV 86.8 78.0 - 100.0 fL   MCH 27.9 26.0 - 34.0 pg   MCHC 32.2 30.0 - 36.0 g/dL   RDW 20.3 (H) 11.5 - 15.5 %   Platelets 268 150 - 400 K/uL  Magnesium     Status: None   Collection Time: 01/12/15  7:10 PM  Result Value Ref Range   Magnesium 1.8 1.7 - 2.4 mg/dL  Glucose, capillary     Status: None   Collection Time: 01/12/15  9:50 PM  Result Value Ref Range   Glucose-Capillary 82 65 - 99 mg/dL  Basic metabolic panel     Status: Abnormal   Collection Time: 01/13/15  5:41 AM  Result Value Ref Range   Sodium 138 135 - 145 mmol/L     Potassium 3.7 3.5 - 5.1 mmol/L   Chloride 107 101 - 111 mmol/L   CO2 22 22 - 32 mmol/L   Glucose, Bld 108 (H) 65 - 99 mg/dL   BUN 35  (H) 6 - 20 mg/dL   Creatinine, Ser 0.96 0.61 - 1.24 mg/dL   Calcium 8.7 (L) 8.9 - 10.3 mg/dL   GFR calc non Af Amer >60 >60 mL/min   GFR calc Af Amer >60 >60 mL/min   Anion gap 9 5 - 15  CBC     Status: Abnormal   Collection Time: 01/13/15  5:41 AM  Result Value Ref Range   WBC 4.2 4.0 - 10.5 K/uL   RBC 3.29 (L) 4.22 - 5.81 MIL/uL   Hemoglobin 9.3 (L) 13.0 - 17.0 g/dL   HCT 28.4 (L) 39.0 - 52.0 %   MCV 86.3 78.0 - 100.0 fL   MCH 28.3 26.0 - 34.0 pg   MCHC 32.7 30.0 - 36.0 g/dL   RDW 18.1 (H) 11.5 - 15.5 %   Platelets 217 150 - 400 K/uL  Protime-INR     Status: None   Collection Time: 01/13/15  5:41 AM  Result Value Ref Range   Prothrombin Time 14.0 11.6 - 15.2 seconds   INR 1.06 0.00 - 1.49  Glucose, capillary     Status: Abnormal   Collection Time: 01/13/15  7:44 AM  Result Value Ref Range   Glucose-Capillary 101 (H) 65 - 99 mg/dL  Glucose, capillary     Status: Abnormal   Collection Time: 01/13/15 12:28 PM  Result Value Ref Range   Glucose-Capillary 166 (H) 65 - 99 mg/dL     No results found.  ROS:  As stated above in the HPI otherwise negative.  Blood pressure 93/61, pulse 75, temperature 98.4 F (36.9 C), temperature source Oral, resp. rate 18, SpO2 100 %.    PE: Gen: NAD, Alert and Oriented HEENT:  /AT, EOMI Neck: Supple, no LAD Lungs: CTA Bilaterally CV: RRR without M/G/R ABM: Soft, NTND, +BS Ext: No C/C/E  Assessment/Plan: 1) Gastric adenocarcinoma. 2) GI bleed. 3) Afib.   I will perform an EGD tomorrow and ablate any sites of bleeding.  With his significant GI bleed and his gastric cancer, the benefits will most likely be higher if he stops coumadin or any anticoagulation completely.  It appears that his GI bleeding poses more of a life threatening risk than stroke prevention from his afib.  Plan: 1) EGD tomorrow.  Chinedu Agustin D 01/13/2015, 12:58 PM      

## 2015-01-14 NOTE — Interval H&P Note (Signed)
History and Physical Interval Note:  01/14/2015 2:44 PM  Patrick [name]  has presented today for surgery, with the diagnosis of GI bleed  The various methods of treatment have been discussed with the patient and family. After consideration of risks, benefits and other options for treatment, the patient has consented to  Procedure(s): ESOPHAGOGASTRODUODENOSCOPY (EGD) (N/A) as a surgical intervention .  The patient's history has been reviewed, patient examined, no change in status, stable for surgery.  I have reviewed the patient's chart and labs.  Questions were answered to the patient's satisfaction.     Debbe Crumble D

## 2015-01-14 NOTE — Progress Notes (Signed)
PATIENT DETAILS Name: Patrick [name] Age: 69 y.o. Sex: male Date of Birth: May 17, 1945 Admit Date: 01/12/2015 Admitting Physician Debbe Odea, MD [id number], MD  Brief narrative:  69 year old male with history of gastric ulcer-currently on chemotherapy-status post radiation last year-history of atrial fibrillation-on Lovenox and aspirin prior to this admission-presented to the ED with melena and acute blood loss anemia. Similar admission a few weeks back.  Subjective: No further melena  Assessment/Plan: Principal Problem: Upper GI bleeding: No further melena, has history of gastric cancer-since patient had similar admission a few weeks back-and on anticoagulation for atrial fibrillation-have consulted gastroenterology for EGD-which scheduled today. Continue PPI-await EGD results  Active Problems: Acute blood loss anemia: Secondary to above, transfused 2 units of PRBC on admission-hemoglobin stable at 9.4  Atrial fibrillation: Remains in atrial fibrillation-but rate controlled with Coreg.On Lovenox prior to admission-case d/w Dr Ellyn Hack earlier today-agrees that not a great anticoagulation candidate in this setting and that risks of long term anticoagulation outweights any benefits. Subsequently spoke with patient- his pastor at bedside-informed him about this difficult situation-he understands and is agreeable to come off anticoagulation. CHA2DS2-VASc score 5.  Essential hypertension: Continue Coreg-appears controlled  Type 2 diabetes: CBGs stable, continue SSI-resume metformin and glipizide on discharge  History of gastric cancer: Follows with oncology-on Xeloda. Is status post radiation therapy.  History of bronchial asthma: Appears stable-clear lungs-continue as needed bronchodilators  Disposition: Remain inpatient-home later today or in am  Antimicrobial agents  See below  Anti-infectives    None      DVT Prophylaxis: SCD's  Code  Status: Full code   Family Communication Pastor at bedside (spoke after patient's permission)  Procedures: None  CONSULTS:  GI  Time spent 35 minutes-Greater than 50% of this time was spent in counseling, explanation of diagnosis, planning of further management, and coordination of care.  MEDICATIONS: Scheduled Meds: . atorvastatin  10 mg Oral Daily  . capecitabine  1,000 mg Oral BID PC  . carvedilol  3.125 mg Oral BID WC  . docusate sodium  100 mg Oral BID  . insulin aspart  0-5 Units Subcutaneous QHS  . insulin aspart  0-9 Units Subcutaneous TID WC  . pantoprazole (PROTONIX) IV  40 mg Intravenous Q12H   Continuous Infusions:  PRN Meds:.acetaminophen **OR** acetaminophen, albuterol, ondansetron **OR** ondansetron (ZOFRAN) IV    PHYSICAL EXAM: Vital signs in last 24 hours: Filed Vitals:   01/13/15 1510 01/13/15 2126 01/14/15 0515 01/14/15 0908  BP: 106/70 106/63 103/55 113/58  Pulse: 71 80 117 87  Temp: 98.6 F (37 C) 98.7 F (37.1 C) 98 F (36.7 C)   TempSrc: Oral Oral Oral   Resp: 18 18 18    Height:      Weight:      SpO2: 100% 100% 90% 100%    Weight change:  Filed Weights   01/13/15 1445  Weight: 78.4 kg (172 lb 13.5 oz)   Body mass index is 23.44 kg/(m^2).   Gen Exam: Awake and alert with clear speech. Neck: Supple, No JVD.   Chest: B/L Clear.   CVS: S1 S2 Regular, no murmurs.  Abdomen: soft, BS +, non tender, non distended.  Extremities: no edema, lower extremities warm to touch. Neurologic: Non Focal.   Skin: No Rash.   Wounds: N/A.    Intake/Output from previous day:  Intake/Output Summary (Last 24 hours) at 01/14/15 1240 Last data filed at 01/14/15 1011  Gross  per 24 hour  Intake      0 ml  Output   1350 ml  Net  -1350 ml     LAB RESULTS: CBC  Recent Labs Lab 01/11/15 0844 01/12/15 1510 01/12/15 1910 01/13/15 0541 01/13/15 1831 01/14/15 0645  WBC 3.7* 6.1 4.9 4.2 4.1 4.2  HGB 9.8* 7.7* 7.6* 9.3* 9.9* 9.4*  HCT 30.2*  23.8* 23.6* 28.4* 29.2* 28.6*  PLT 296 253 268 217 132* 181  MCV 88.0 86.9 86.8 86.3 85.9 87.7  MCH 28.6 28.1 27.9 28.3 29.1 28.8  MCHC 32.5 32.4 32.2 32.7 33.9 32.9  RDW 20.3* 20.4* 20.3* 18.1* 18.7* 19.1*  LYMPHSABS 0.6*  --   --   --   --   --   MONOABS 0.4  --   --   --   --   --   EOSABS 0.1  --   --   --   --   --   BASOSABS 0.0  --   --   --   --   --     Chemistries   Recent Labs Lab 01/11/15 0844 01/12/15 1510 01/12/15 1910 01/13/15 0541  NA 141 140  --  138  K 3.7 4.1  --  3.7  CL  --  107  --  107  CO2 28 21*  --  22  GLUCOSE 101 94  --  108*  BUN 15.9 41*  --  35*  CREATININE 1.0 1.08  --  0.96  CALCIUM 9.4 8.7*  --  8.7*  MG  --   --  1.8  --     CBG:  Recent Labs Lab 01/13/15 1228 01/13/15 1804 01/13/15 2245 01/14/15 0807 01/14/15 1130  GLUCAP 166* 118* 146* 121* 106*    GFR Estimated Creatinine Clearance: 77.5 mL/min (by C-G formula based on Cr of 0.96).  Coagulation profile  Recent Labs Lab 01/12/15 1730 01/13/15 0541  INR 1.17 1.06    Cardiac Enzymes No results for input(s): CKMB, TROPONINI, MYOGLOBIN in the last 168 hours.  Invalid input(s): CK  Invalid input(s): POCBNP No results for input(s): DDIMER in the last 72 hours. No results for input(s): HGBA1C in the last 72 hours. No results for input(s): CHOL, HDL, LDLCALC, TRIG, CHOLHDL, LDLDIRECT in the last 72 hours. No results for input(s): TSH, T4TOTAL, T3FREE, THYROIDAB in the last 72 hours.  Invalid input(s): FREET3  Recent Labs  01/13/15 1435  VITAMINB12 180  FOLATE 10.7  FERRITIN 32  TIBC 378  IRON 324*  RETICCTPCT 0.9   No results for input(s): LIPASE, AMYLASE in the last 72 hours.  Urine Studies No results for input(s): UHGB, CRYS in the last 72 hours.  Invalid input(s): UACOL, UAPR, USPG, UPH, UTP, UGL, UKET, UBIL, UNIT, UROB, ULEU, UEPI, UWBC, URBC, UBAC, CAST, UCOM, BILUA  MICROBIOLOGY: No results found for this or any previous visit (from the past 240  hour(s)).  RADIOLOGY STUDIES/RESULTS: Nm Bone Scan Whole Body  01/11/2015  CLINICAL DATA:  Gastric malignancy. EXAM: NUCLEAR MEDICINE WHOLE BODY BONE SCAN TECHNIQUE: Whole body anterior and posterior images were obtained approximately 3 hours after intravenous injection of radiopharmaceutical. RADIOPHARMACEUTICALS:  25.5 MCi Technetium-85m MDP IV COMPARISON:  PET-CT study of September 16, 2014 FINDINGS: There is adequate uptake of the radiopharmaceutical by the skeleton. Adequate soft tissue clearance and left renal activity is observed. The right kidney lies in the pelvis and activity within it is visible. Uptake within the mid cervical spine is consistent with degenerative  change. Uptake within the thoracic and lumbar spine as well as the ribs and pectoral girdle is normal. There is increased uptake on the posterior images within the sacrum bilaterally. Activity within the iliac and pubic bones is normal. Activity within the lower extremities is normal. IMPRESSION: 1. There is increased uptake bilaterally within the sacrum which may reflect metastatic disease. Plain films of the pelvis with attention to the sacrum is recommended. 2. Activity in the mid cervical spine is likely degenerative in nature. Plain films are recommended. 3. The patient has a functioning right-sided pelvic kidney. Electronically Signed   By: David  Martinique M.D.   On: 01/11/2015 12:49   Dg Chest Port 1 View  12/20/2014  CLINICAL DATA:  Weakness, history of atrial fibrillation EXAM: PORTABLE CHEST 1 VIEW COMPARISON:  09/16/2014 FINDINGS: Borderline cardiomegaly. No acute infiltrate or pleural effusion. No pulmonary edema. Bony thorax is unremarkable. IMPRESSION: No active disease. Electronically Signed   By: Lahoma Crocker M.D.   On: 12/20/2014 12:19    Oren Binet, MD  [facility] Pager:[phone]  If 7PM-7AM, please contact night-coverage [email or web address] Password [email or web address] 01/14/2015, 12:40 PM   LOS: 2 days

## 2015-01-14 NOTE — Op Note (Signed)
[facility] [place], [id number]   ENDOSCOPY PROCEDURE REPORT  PATIENT: Patrick [name], Patrick [name]  MR#: [id number] BIRTHDATE: 12/02/1945 , 69  yrs. old GENDER: male ENDOSCOPIST:Garrick Midgley Benson Norway, MD REFERRED BY: PROCEDURE DATE:  Jan 29, 2015 PROCEDURE:   EGD, diagnostic ASA CLASS:    Class III INDICATIONS:  GI bleed MEDICATION: Versed 2 mg IV and Fentanyl 25 mcg IV TOPICAL ANESTHETIC:   none  DESCRIPTION OF PROCEDURE:   After the risks and benefits of the procedure were explained, informed consent was obtained.  The Pentax Gastroscope [id number]  endoscope was introduced through the mouth  and advanced to the second portion of the duodenum .  The instrument was slowly withdrawn as the mucosa was fully examined. Estimated blood loss is zero unless otherwise noted in this procedure report.   FINDINGS: The esophagus was normal.  In the lesser curvature of the stomach a large mass was again identified.  Subjectively it appeared to be larger than initially diagnosed one year ago.  there was most likely an ulceration in the distal portion of the mass. The area was very friable as light touch of the area precipitated bleeding.  In the cardia region there was a mass, most likely a secondary lesion measuring 1.5-2 cm.  This area was also friable. The duodenum was normal.    Retroflexed views revealed no abnormalities.    The scope was then withdrawn from the patient and the procedure completed.  COMPLICATIONS: There were no immediate complications.  ENDOSCOPIC IMPRESSION: 1) Lesser curvature gastric adenocarcinoma with friability and an ulceration in the distal portion of the mass. 2) Probable secondary site in the cardia of the stomach.  RECOMMENDATIONS: 1) Avoid all anticoagulation.  _______________________________ eSignedCarol Ada, MD 01-29-2015 3:04 PM     cc:  CPT CODES: ICD CODES:  The ICD and CPT codes recommended by this software  are interpretations from the data that the clinical staff has captured with the software.  The verification of the translation of this report to the ICD and CPT codes and modifiers is the sole responsibility of the health care institution and practicing physician where this report was generated.  [employer] will not be held responsible for the validity of the ICD and CPT codes included on this report.  AMA assumes no liability for data contained or not contained herein. CPT is a Designer, television/film set of the [employer].  PATIENT NAME:  Patrick [name], Patrick [name] MR#: [id number]

## 2015-01-15 ENCOUNTER — Encounter (HOSPITAL_COMMUNITY): Payer: Self-pay | Admitting: Gastroenterology

## 2015-01-15 LAB — CBC
HEMATOCRIT: 27.6 % — AB (ref 39.0–52.0)
HEMOGLOBIN: 9.1 g/dL — AB (ref 13.0–17.0)
MCH: 28.8 pg (ref 26.0–34.0)
MCHC: 33 g/dL (ref 30.0–36.0)
MCV: 87.3 fL (ref 78.0–100.0)
Platelets: 210 10*3/uL (ref 150–400)
RBC: 3.16 MIL/uL — AB (ref 4.22–5.81)
RDW: 18.5 % — ABNORMAL HIGH (ref 11.5–15.5)
WBC: 3.7 10*3/uL — AB (ref 4.0–10.5)

## 2015-01-15 LAB — GLUCOSE, CAPILLARY: GLUCOSE-CAPILLARY: 124 mg/dL — AB (ref 65–99)

## 2015-01-15 MED ORDER — PANTOPRAZOLE SODIUM 40 MG PO TBEC: 40.0000 mg | DELAYED_RELEASE_TABLET | Freq: Every day | ORAL | Status: DC

## 2015-01-15 NOTE — Progress Notes (Signed)
Subjective: No complaints.  Objective: Vital signs in last 24 hours: Temp:  [97.6 F (36.4 C)-98.7 F (37.1 C)] 98.5 F (36.9 C) (11/11 0535) Pulse Rate:  [42-106] 93 (11/11 0535) Resp:  [9-24] 15 (11/11 0535) BP: (94-127)/(52-73) 94/58 mmHg (11/11 0535) SpO2:  [98 %-100 %] 100 % (11/11 0535) Last BM Date: 01/14/15  Intake/Output from previous day: 11/10 0701 - 11/11 0700 In: 120 [P.O.:120] Out: 550 [Urine:550] Intake/Output this shift:    General appearance: alert and no distress GI: soft, non-tender; bowel sounds normal; no masses,  no organomegaly  Lab Results:  Recent Labs  01/13/15 1831 01/14/15 0645 01/15/15 0615  WBC 4.1 4.2 3.7*  HGB 9.9* 9.4* 9.1*  HCT 29.2* 28.6* 27.6*  PLT 132* 181 210   BMET  Recent Labs  01/12/15 1510 01/13/15 0541  NA 140 138  K 4.1 3.7  CL 107 107  CO2 21* 22  GLUCOSE 94 108*  BUN 41* 35*  CREATININE 1.08 0.96  CALCIUM 8.7* 8.7*   LFT No results for input(s): PROT, ALBUMIN, AST, ALT, ALKPHOS, BILITOT, BILIDIR, IBILI in the last 72 hours. PT/INR  Recent Labs  01/12/15 1730 01/13/15 0541  LABPROT 15.1 14.0  INR 1.17 1.06   Hepatitis Panel No results for input(s): HEPBSAG, HCVAB, HEPAIGM, HEPBIGM in the last 72 hours. C-Diff No results for input(s): CDIFFTOX in the last 72 hours. Fecal Lactopherrin No results for input(s): FECLLACTOFRN in the last 72 hours.  Studies/Results: No results found.  Medications:  Scheduled: . atorvastatin  10 mg Oral Daily  . capecitabine  1,000 mg Oral BID PC  . carvedilol  3.125 mg Oral BID WC  . docusate sodium  100 mg Oral BID  . insulin aspart  0-5 Units Subcutaneous QHS  . insulin aspart  0-9 Units Subcutaneous TID WC  . pantoprazole (PROTONIX) IV  40 mg Intravenous Q12H   Continuous:   Assessment/Plan: 1) Gastric adenocarcinoma. 2) GI bleed from cancer.   As noted on the the EGD examination the gastric cancer appears to have progressed locally and there is most  likely a secondary site in the cardia region of the stomach.  The risk for a GI bleed outweighs the cardiac risk with anticoagulation.  Plan: 1) ASA is okay, but no other anticoagulation. 2) Further treatment of the adenocarcinoma per Oncology. 3) Okay to D/C Home.   LOS: 3 days   Sher Shampine D 01/15/2015, 8:38 AM

## 2015-01-15 NOTE — Care Management Important Message (Signed)
Important Message  Patient Details  Name: Patrick [name] MRN: [id number] Date of Birth: [birth year]   Medicare Important Message Given:  Yes    Barb Merino Dajohn Ellender 01/15/2015, 2:36 PM

## 2015-01-15 NOTE — Discharge Summary (Addendum)
PATIENT DETAILS Name: Patrick [name] Age: 69 y.o. Sex: male Date of Birth: [birth year] MRN: [id number]. Admitting Physician: Debbe Odea, MD [id number], MD  Admit Date: 01/12/2015 Discharge date: 01/15/2015  Recommendations for Outpatient Follow-up:  1. Avoid all anticoagulation for the future-second episode of GI bleeding-ulcerated gastric cancer found on EGD. 2. Please repeat CBC at next visit with either PCP or oncology.   PRIMARY DISCHARGE DIAGNOSIS:  Principal Problem:   Acute blood loss anemia Active Problems:   Essential hypertension   Persistent atrial fibrillation (HCC)   TxN1 adenocarcinoma of the gastric antrum   Diabetes mellitus type II, non insulin dependent (HCC)   Upper GI bleed   Acute kidney injury (Patrick [name])   Anemia associated with acute blood loss      PAST MEDICAL HISTORY: Past Medical History  Diagnosis Date  . High blood pressure   . Dyslipidemia   . Persistent atrial fibrillation (Patrick [name]) 01/14/2009    afib--cardioversion successful x1 shock; recurrent A. fib  . 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 Type II diabetes mellitus ([place])   . Anemia   . History of blood transfusion 12/2014; 01/12/2015    "related to low HgB; related to low HgB" (01/12/2015)  . Upper GI bleed 12/2014; 01/12/2015  . Stroke Patrick [name]) 2009    denies residual on 01/12/2015    DISCHARGE MEDICATIONS: Current Discharge Medication List    START taking these medications   Details  pantoprazole (PROTONIX) 40 MG tablet Take 1 tablet (40 mg total) by mouth daily. Qty: 60 tablet, Refills: 0      CONTINUE these medications which have NOT CHANGED   Details  aspirin 81 MG EC tablet Take 81 mg by mouth at bedtime.     atorvastatin (LIPITOR) 10 MG tablet Take 10 mg by mouth daily.      capecitabine (XELODA) 500 MG tablet Take 2 tablets by mouth twice a day. Qty: 120 tablet, Refills: 0   Associated Diagnoses: Malignant neoplasm of pyloric antrum (HCC)      carvedilol (COREG) 3.125 MG tablet Take 1 tablet (3.125 mg total) by mouth 2 (two) times daily with a meal. Qty: 60 tablet, Refills: 0    ferrous sulfate 325 (65 FE) MG tablet Take 325 mg by mouth 2 (two) times daily with a meal.  Refills: 0    glipiZIDE (GLUCOTROL XL) 5 MG 24 hr tablet Take 5 mg by mouth 2 (two) times daily.    metFORMIN (GLUCOPHAGE) 1000 MG tablet Take 1,000 mg by mouth 2 (two) times daily with a meal.  Refills: 0    loperamide (IMODIUM A-D) 2 MG tablet Take 2 mg by mouth 4 (four) times daily as needed for diarrhea or loose stools.    PROAIR HFA 108 (90 BASE) MCG/ACT inhaler Inhale 2 puffs into the lungs 4 (four) times daily as needed for wheezing or shortness of breath.       STOP taking these medications     enoxaparin (LOVENOX) 80 MG/0.8ML injection         ALLERGIES:  No Known Allergies  BRIEF HPI:  See H&P, Labs, Consult and Test reports for all details in brief, 69 year old male with history of gastric ulcer-currently on chemotherapy-status post radiation last year-history of atrial fibrillation-on Lovenox and aspirin prior to this admission-presented to the ED with melena and acute blood loss anemia.  CONSULTATIONS:   GI  PERTINENT RADIOLOGIC STUDIES: Nm Bone Scan Whole Body  01/11/2015  CLINICAL DATA:  Gastric malignancy. EXAM: NUCLEAR  MEDICINE WHOLE BODY BONE SCAN TECHNIQUE: Whole body anterior and posterior images were obtained approximately 3 hours after intravenous injection of radiopharmaceutical. RADIOPHARMACEUTICALS:  25.5 MCi Technetium-64m MDP IV COMPARISON:  PET-CT study of September 16, 2014 FINDINGS: There is adequate uptake of the radiopharmaceutical by the skeleton. Adequate soft tissue clearance and left renal activity is observed. The right kidney lies in the pelvis and activity within it is visible. Uptake within the mid cervical spine is consistent with degenerative change. Uptake within the thoracic and lumbar spine as well as the ribs and  pectoral girdle is normal. There is increased uptake on the posterior images within the sacrum bilaterally. Activity within the iliac and pubic bones is normal. Activity within the lower extremities is normal. IMPRESSION: 1. There is increased uptake bilaterally within the sacrum which may reflect metastatic disease. Plain films of the pelvis with attention to the sacrum is recommended. 2. Activity in the mid cervical spine is likely degenerative in nature. Plain films are recommended. 3. The patient has a functioning right-sided pelvic kidney. Electronically Signed   By: Patrick  [name] M.D.   On: 01/11/2015 12:49   Dg Chest Port 1 View  12/20/2014  CLINICAL DATA:  Weakness, history of atrial fibrillation EXAM: PORTABLE CHEST 1 VIEW COMPARISON:  09/16/2014 FINDINGS: Borderline cardiomegaly. No acute infiltrate or pleural effusion. No pulmonary edema. Bony thorax is unremarkable. IMPRESSION: No active disease. Electronically Signed   By: Lahoma Crocker M.D.   On: 12/20/2014 12:19     PERTINENT LAB RESULTS: CBC:  Recent Labs  01/14/15 0645 01/15/15 0615  WBC 4.2 3.7*  HGB 9.4* 9.1*  HCT 28.6* 27.6*  PLT 181 210   CMET CMP     Component Value Date/Time   NA 138 01/13/2015 0541   NA 141 01/11/2015 0844   K 3.7 01/13/2015 0541   K 3.7 01/11/2015 0844   CL 107 01/13/2015 0541   CO2 22 01/13/2015 0541   CO2 28 01/11/2015 0844   GLUCOSE 108* 01/13/2015 0541   GLUCOSE 101 01/11/2015 0844   BUN 35* 01/13/2015 0541   BUN 15.9 01/11/2015 0844   CREATININE 0.96 01/13/2015 0541   CREATININE 1.0 01/11/2015 0844   CALCIUM 8.7* 01/13/2015 0541   CALCIUM 9.4 01/11/2015 0844   PROT 6.9 01/11/2015 0844   PROT 5.5* 12/24/2014 0708   ALBUMIN 3.7 01/11/2015 0844   ALBUMIN 3.0* 12/24/2014 0708   AST 29 01/11/2015 0844   AST 18 12/24/2014 0708   ALT 36 01/11/2015 0844   ALT 12* 12/24/2014 0708   ALKPHOS 76 01/11/2015 0844   ALKPHOS 56 12/24/2014 0708   BILITOT 0.51 01/11/2015 0844   BILITOT 0.8  12/24/2014 0708   GFRNONAA >60 01/13/2015 0541   GFRAA >60 01/13/2015 0541    GFR Estimated Creatinine Clearance: 77.5 mL/min (by C-G formula based on Cr of 0.96). No results for input(s): LIPASE, AMYLASE in the last 72 hours. No results for input(s): CKTOTAL, CKMB, CKMBINDEX, TROPONINI in the last 72 hours. Invalid input(s): POCBNP No results for input(s): DDIMER in the last 72 hours. No results for input(s): HGBA1C in the last 72 hours. No results for input(s): CHOL, HDL, LDLCALC, TRIG, CHOLHDL, LDLDIRECT in the last 72 hours. No results for input(s): TSH, T4TOTAL, T3FREE, THYROIDAB in the last 72 hours.  Invalid input(s): FREET3  Recent Labs  01/13/15 1435  VITAMINB12 180  FOLATE 10.7  FERRITIN 32  TIBC 378  IRON 324*  RETICCTPCT 0.9   Coags:  Recent Labs  01/12/15  1730 01/13/15 0541  INR 1.17 1.06   Microbiology: No results found for this or any previous visit (from the past 240 hour(s)).   BRIEF HOSPITAL COURSE:  Upper GI bleeding: No further melena for the past 24 hours, has history of gastric cancer-since patient had similar admission a few weeks back-and on anticoagulation with Lovenox for atrial fibrillation- consulted gastroenterology-underwent EGD on 11/10--which showed lesser curvature gastric adenocarcinoma with friability/ulceration which bled easily. Recommendations from gastroenterology are to hold anticoagulation indefinitely-okay for aspirin 81 mg daily. Since no further bleeding-hemoglobin stable-we will discharge on PPI. Have discussed case with patient's oncologist Dr. Alen Blew over the phone today-he will arrange for outpatient follow-up. Suggest repeat CBC at next visit with oncology or PCP.  Active Problems: Acute blood loss anemia: Secondary to above, transfused 2 units of PRBC on admission-hemoglobin stable at 9.1. See above  Atrial fibrillation: Remains in atrial fibrillation-but rate controlled with Coreg.On Lovenox prior to admission-case d/w Dr  Ellyn Hack personally during this hospital course-given EGD findings-patient is no longer a candidate for long term anticoagulation. CHA2DS2-VASc score 5. Subsequently spoke with patient- his pastor at bedside-informed him about this difficult situation-he understands and is agreeable to come off anticoagulation. Patient is aware of cardiac embolic risk like CVA.  Essential hypertension: Continue Coreg-appears controlled  Type 2 diabetes: CBGs stable with SSI-resume metformin and glipizide on discharge  History of gastric cancer: Follows with oncology-on Xeloda. Is status post radiation therapy. See above-oncology will arrange for outpatient follow-up  History of bronchial asthma: Appears stable-clear lungs-continue as needed bronchodilators  TODAY-DAY OF DISCHARGE:  Subjective:   Patrick [name] today has no headache,no chest abdominal pain,no new weakness tingling or numbness, feels much better wants to go home today.   Objective:   Blood pressure 94/58, pulse 93, temperature 98.5 F (36.9 C), temperature source Oral, resp. rate 15, height 6' (1.829 m), weight 78.4 kg (172 lb 13.5 oz), SpO2 100 %.  Intake/Output Summary (Last 24 hours) at 01/15/15 1151 Last data filed at 01/14/15 1800  Gross per 24 hour  Intake    120 ml  Output    200 ml  Net    -80 ml   Filed Weights   01/13/15 1445  Weight: 78.4 kg (172 lb 13.5 oz)    Exam Awake Alert, Oriented *3, No new F.N deficits, Normal affect [place].AT,PERRAL Supple Neck,No JVD, No cervical lymphadenopathy appriciated.  Symmetrical Chest wall movement, Good air movement bilaterally, CTAB RRR,No Gallops,Rubs or new Murmurs, No Parasternal Heave +ve B.Sounds, Abd Soft, Non tender, No organomegaly appriciated, No rebound -guarding or rigidity. No Cyanosis, Clubbing or edema, No new Rash or bruise  DISCHARGE CONDITION: Stable  DISPOSITION: Home  DISCHARGE INSTRUCTIONS:    Activity:  As tolerated   Get Medicines reviewed and  adjusted: Please take all your medications with you for your next visit with your Primary MD  Please request your Primary MD to go over all hospital tests and procedure/radiological results at the follow up, please ask your Primary MD to get all Hospital records sent to his/her office.  If you experience worsening of your admission symptoms, develop shortness of breath, life threatening emergency, suicidal or homicidal thoughts you must seek medical attention immediately by calling 911 or calling your MD immediately  if symptoms less severe.  You must read complete instructions/literature along with all the possible adverse reactions/side effects for all the Medicines you take and that have been prescribed to you. Take any new Medicines after you have completely understood  and accpet all the possible adverse reactions/side effects.   Do not drive when taking Pain medications.   Do not take more than prescribed Pain, Sleep and Anxiety Medications  Special Instructions: If you have smoked or chewed Tobacco  in the last 2 yrs please stop smoking, stop any regular Alcohol  and or any Recreational drug use.  Wear Seat belts while driving.  Please note  You were cared for by a hospitalist during your hospital stay. Once you are discharged, your primary care physician will handle any further medical issues. Please note that NO REFILLS for any discharge medications will be authorized once you are discharged, as it is imperative that you return to your primary care physician (or establish a relationship with a primary care physician if you do not have one) for your aftercare needs so that they can reassess your need for medications and monitor your lab values.   Diet recommendation: Diabetic Diet Heart Healthy diet  Discharge Instructions    Call MD for:    Complete by:  As directed   If recurrent black tarry stools or bloody stools     Diet - low sodium heart healthy    Complete by:  As  directed      Diet Carb Modified    Complete by:  As directed      Discharge instructions    Complete by:  As directed   No further coumadin or Lovenox-ok to take Aspirin 81 mg     Increase activity slowly    Complete by:  As directed            Follow-up Information    Follow up with Thressa Sheller, MD. Schedule an appointment as soon as possible for a visit on 01/18/2015.   Specialty:  Internal Medicine   Why:  On 01/18/15 @ 1:15p.m. Please remember to bring a list of medications, and all hospital information (discharge paperwork)   Contact information:   [place], [place] [address] [phone]       Follow up with HARDING, Leonie Green, MD. Schedule an appointment as soon as possible for a visit in 2 weeks.   Specialty:  Cardiology   Contact information:   [address] [place] [address] [phone]       Follow up with [facility] MD.   Specialty:  Oncology   Why:  [place] will call you for a follow-up appointment   Contact information:   [place]. [place] [address] [phone]      Total Time spent on discharge equals 45 minutes.  SignedOren Binet 01/15/2015 11:51 AM

## 2015-01-18 DIAGNOSIS — C169 Malignant neoplasm of stomach, unspecified: Secondary | ICD-10-CM | POA: Diagnosis not present

## 2015-01-18 DIAGNOSIS — E1122 Type 2 diabetes mellitus with diabetic chronic kidney disease: Secondary | ICD-10-CM | POA: Diagnosis not present

## 2015-01-18 DIAGNOSIS — E785 Hyperlipidemia, unspecified: Secondary | ICD-10-CM | POA: Diagnosis not present

## 2015-01-18 DIAGNOSIS — K922 Gastrointestinal hemorrhage, unspecified: Secondary | ICD-10-CM | POA: Diagnosis not present

## 2015-01-19 ENCOUNTER — Telehealth: Payer: Self-pay | Admitting: Oncology

## 2015-01-19 NOTE — Telephone Encounter (Signed)
Returned patient call re r/s f/u after hosp d/c. Rescheduled patient for next available 12/22. Patient also on schedule with Dr. Samara Snide 11/28 for 2 week post hosp f/u. Left message for patient re appointment and mailed schedule. Also informed desk nurse.

## 2015-01-20 ENCOUNTER — Telehealth: Payer: Self-pay | Admitting: *Deleted

## 2015-01-20 NOTE — Telephone Encounter (Signed)
FYI Patient called complaining of "You all keep sending my cancer medicine to [facility].  It keeps getting messed up with insurance. [facility] is where my medicine needs to go.  [facility] contacted me, waiting for this problem to clear up so they can Fed Ex the medicine on Friday.  I'm on my week off and hopefully will receive it in time but would like for you all to not electronically fax these orders to [facility]."

## 2015-02-01 ENCOUNTER — Ambulatory Visit (INDEPENDENT_AMBULATORY_CARE_PROVIDER_SITE_OTHER): Payer: Medicare Other | Admitting: Physician Assistant

## 2015-02-01 ENCOUNTER — Encounter: Payer: Self-pay | Admitting: Physician Assistant

## 2015-02-01 VITALS — BP 90/46 | HR 67 | Ht 72.0 in | Wt 175.8 lb

## 2015-02-01 DIAGNOSIS — E785 Hyperlipidemia, unspecified: Secondary | ICD-10-CM | POA: Diagnosis not present

## 2015-02-01 DIAGNOSIS — I10 Essential (primary) hypertension: Secondary | ICD-10-CM

## 2015-02-01 DIAGNOSIS — I482 Chronic atrial fibrillation, unspecified: Secondary | ICD-10-CM

## 2015-02-01 DIAGNOSIS — I481 Persistent atrial fibrillation: Secondary | ICD-10-CM

## 2015-02-01 DIAGNOSIS — I4819 Other persistent atrial fibrillation: Secondary | ICD-10-CM

## 2015-02-01 NOTE — Patient Instructions (Signed)
Your physician recommends that you schedule a follow-up appointment in: 6 months with Dr Harding 

## 2015-02-01 NOTE — Progress Notes (Signed)
Patient ID: Patrick [name], male   DOB: June 08, 1945, 69 y.o.   MRN: [id number]    Date:  02/01/2015   ID:  Patrick [name], DOB 12-26-1945, MRN [id number]  PCP:  Thressa Sheller, MD  Primary Cardiologist:  Ellyn Hack   Chief Complaint  Patient presents with  . Follow-up    2 week post hosp (bleeding)//pt states no Sx.     History of Present Illness: Patrick [name] is a 69 y.o. male with history of chronic atrial fibrillation, diabetes mellitus adenocarcinoma of the gastric antrum, diabetes mellitus2, dyslipidemia and essential hypertension. Patient was recently admitted with upper GI bleed secondary to his gastric cancer area Coumadin was discontinued and he was discharged on low dose aspirin only.  He had an echocardiogram October 2016 revealing an ejection fraction of 55-60% with normal wall motion. Was mild aortic valve regurgitation. Left atrium was moderately dilated as was the right atrium. Systolic PA pressure  was 51 mmHg. Has moderate tricuspid regurgitation.  Patient is here for follow-up he's feeling a little bit fatigued but otherwise feels okay. He denies any shortness of breath or dizziness given his blood pressure on the low side. Says it was [id number] systolic and his primary care provider's office recently  The patient currently denies nausea, vomiting, fever, chest pain, orthopnea, dizziness, PND, cough, congestion, abdominal pain, hematochezia, melena, lower extremity edema, claudication.  Wt Readings from Last 3 Encounters:  02/01/15 175 lb 12.8 oz (79.742 kg)  01/13/15 172 lb 13.5 oz (78.4 kg)  12/22/14 176 lb 9.4 oz (80.1 kg)     Past Medical History  Diagnosis Date  . High blood pressure   . Dyslipidemia   . Persistent atrial fibrillation ([place]) 01/14/2009    afib--cardioversion successful x1 shock; recurrent A. fib  . 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 Type II diabetes mellitus ([place])   . Anemia   . History of blood transfusion 12/2014; 01/12/2015   "related to low HgB; related to low HgB" (01/12/2015)  . Upper GI bleed 12/2014; 01/12/2015  . Stroke [facility]) 2009    denies residual on 01/12/2015    Current Outpatient Prescriptions  Medication Sig Dispense Refill  . aspirin 81 MG EC tablet Take 81 mg by mouth at bedtime.     Marland Kitchen atorvastatin (LIPITOR) 10 MG tablet Take 10 mg by mouth daily.      . capecitabine (XELODA) 500 MG tablet Take 2 tablets by mouth twice a day. (Patient taking differently: Take 1,000 mg/m2 by mouth 2 (two) times daily after a meal. Take 2 tablets by mouth twice a day.) 120 tablet 0  . carvedilol (COREG) 3.125 MG tablet Take 1 tablet (3.125 mg total) by mouth 2 (two) times daily with a meal. 60 tablet 0  . ferrous sulfate 325 (65 FE) MG tablet Take 325 mg by mouth 2 (two) times daily with a meal.   0  . furosemide (LASIX) 20 MG tablet Take 1 tablet by mouth daily.  0  . glipiZIDE (GLUCOTROL XL) 5 MG 24 hr tablet Take 5 mg by mouth 2 (two) times daily.    Marland Kitchen lisinopril-hydrochlorothiazide (PRINZIDE,ZESTORETIC) 20-12.5 MG tablet Take 2 tablets by mouth daily.  0  . loperamide (IMODIUM A-D) 2 MG tablet Take 2 mg by mouth 4 (four) times daily as needed for diarrhea or loose stools.    . metFORMIN (GLUCOPHAGE) 1000 MG tablet Take 1,000 mg by mouth 2 (two) times daily with a meal.   0  . pantoprazole (PROTONIX) 40 MG  tablet Take 1 tablet (40 mg total) by mouth daily. 60 tablet 0   No current facility-administered medications for this visit.    Allergies:   No Known Allergies  Social History:  The patient  reports that he quit smoking about 6 years ago. His smoking use included Cigarettes. He has a 45 pack-year smoking history. He has never used smokeless tobacco. He reports that he drinks alcohol. He reports that he does not use illicit drugs.   Family history:  No family history on file.  ROS:  Please see the history of present illness.  All other systems reviewed and negative.   PHYSICAL EXAM: VS:  BP 90/46 mmHg  Pulse  67  Ht 6' (1.829 m)  Wt 175 lb 12.8 oz (79.742 kg)  BMI 23.84 kg/m2 Well nourished, well developed, in no acute distress HEENT: Pupils are equal round react to light accommodation extraocular movements are intact.  Neck: no JVDNo cervical lymphadenopathy. Cardiac: Irregular rate and rhythm without murmurs rubs or gallops. Lungs:  clear to auscultation bilaterally, no wheezing, rhonchi or rales Abd: soft, nontender, positive bowel sounds all quadrants, no hepatosplenomegaly Ext: no lower extremity edema.  2+ radial and dorsalis pedis pulses. Skin: warm and dry Neuro:  Grossly normal  EKG:  Atrial fibrillation rate 67 bpm   ASSESSMENT AND PLAN:  Problem List Items Addressed This Visit    Essential hypertension (Chronic)   Relevant Medications   lisinopril-hydrochlorothiazide (PRINZIDE,ZESTORETIC) 20-12.5 MG tablet   furosemide (LASIX) 20 MG tablet   Dyslipidemia, goal LDL below 130 - on statin; followed by PCP (Chronic)   Relevant Medications   lisinopril-hydrochlorothiazide (PRINZIDE,ZESTORETIC) 20-12.5 MG tablet   furosemide (LASIX) 20 MG tablet   Chronic atrial fibrillation (HCC) - Primary   Relevant Medications   lisinopril-hydrochlorothiazide (PRINZIDE,ZESTORETIC) 20-12.5 MG tablet   furosemide (LASIX) 20 MG tablet      His atrial fibrillation rate is well controlled. He is on a low-dose of Coreg only his blood pressure today was 90/46. Patient denies any dizziness. We'll continue this dose. He is no longer anticoagulation candidate given his recent GI bleeding secondary to gastric adenocarcinoma. He is also on a statin for his dyslipidemia. We'll have him follow-up in 6 months.

## 2015-02-04 DIAGNOSIS — E1122 Type 2 diabetes mellitus with diabetic chronic kidney disease: Secondary | ICD-10-CM | POA: Diagnosis not present

## 2015-02-04 DIAGNOSIS — K922 Gastrointestinal hemorrhage, unspecified: Secondary | ICD-10-CM | POA: Diagnosis not present

## 2015-02-04 DIAGNOSIS — E785 Hyperlipidemia, unspecified: Secondary | ICD-10-CM | POA: Diagnosis not present

## 2015-02-04 DIAGNOSIS — C169 Malignant neoplasm of stomach, unspecified: Secondary | ICD-10-CM | POA: Diagnosis not present

## 2015-02-08 ENCOUNTER — Other Ambulatory Visit: Payer: Self-pay | Admitting: *Deleted

## 2015-02-08 DIAGNOSIS — C163 Malignant neoplasm of pyloric antrum: Secondary | ICD-10-CM

## 2015-02-08 MED ORDER — CAPECITABINE 500 MG PO TABS: ORAL_TABLET | ORAL | Status: DC

## 2015-02-25 ENCOUNTER — Ambulatory Visit (HOSPITAL_BASED_OUTPATIENT_CLINIC_OR_DEPARTMENT_OTHER): Payer: Medicare Other | Admitting: Oncology

## 2015-02-25 ENCOUNTER — Telehealth: Payer: Self-pay | Admitting: Oncology

## 2015-02-25 VITALS — BP 112/65 | HR 75 | Temp 98.0°F | Resp 18 | Ht 72.0 in | Wt 177.9 lb

## 2015-02-25 DIAGNOSIS — D500 Iron deficiency anemia secondary to blood loss (chronic): Secondary | ICD-10-CM | POA: Diagnosis not present

## 2015-02-25 DIAGNOSIS — C163 Malignant neoplasm of pyloric antrum: Secondary | ICD-10-CM | POA: Diagnosis not present

## 2015-02-25 NOTE — Telephone Encounter (Signed)
Gave pt appts for 1/25 + 1/27. Advised pt that c-sched will call him to schedule PET scan. Pt verbalized understanding.

## 2015-02-25 NOTE — Progress Notes (Signed)
Hematology and Oncology Follow Up Visit  Leldon Normil [id number] July 04, 1945 69 y.o. 02/25/2015 2:54 PM Thressa Sheller, MDMackenzie, [name], MD   Principle Diagnosis: 69 year old gentleman with invasive adenocarcinoma of the stomach. He presented with a large elsewhere and iron deficiency anemia related to that in August of 2015. He is status post an endoscopy and a biopsy on 10/21/2013 confirmed the diagnosis. He presented with a hemoglobin of 7.9, MCV 68.8  Prior Therapy:  Status post IV iron 1 g given on 11/04/2013. This was repeated in August 2016. He is status post definitive radiation therapy between 12/03/2013 and 12/30/2013. He received 40 Gray in 20 fractions.  Current therapy: Xeloda at a dose of 1000 mg twice a day 2 weeks on 1 week off started in July 2016.   Interim History: Mr. Ledon presents today for a followup visit by himself. Since his last visit, he missed his appointments because of hospitalization during to GI bleeding. He presented back in October 2016 with hematochezia and supratherapeutic INR. He was taken off all anticoagulation since that time and have been on aspirin only for atrial fibrillation. This is discharge, he has felt well and resumed all activities of daily living. He is no longer reporting any fatigue or tiredness. Has not reported any hematochezia or melena.   He reports no complications associated with Xeloda. He continues to receive it without any issue. Does not report any diarrhea, hand-foot syndrome, mucositis.He does not report any nausea, vomiting or abdominal pain. He is able to ambulate with a cane and uses public transportation. Son continues to live with him and assist in his care at times.   He does not report any hemoptysis or hematemesis. He is reported headaches or blurred vision or syncope. Does not report any fevers, chills, sweats or weight loss. Does not report any chest pain, orthopnea, palpitations or PND. Does report any cough or  wheezing or shortness of breath. He has not reported any urinary symptoms of frequency urgency or hesitancy. He does not report any skeletal complaints of arthralgias or myalgias. He has not reported any lymphadenopathy or petechiae. He reports no skin rashes or lesions. His performance status still limited but have not declined. Rest of her review of systems unremarkable.      Medications: I have reviewed the patient's current medications.  Current Outpatient Prescriptions  Medication Sig Dispense Refill  . aspirin 81 MG EC tablet Take 81 mg by mouth at bedtime.     Marland Kitchen atorvastatin (LIPITOR) 10 MG tablet Take 10 mg by mouth daily.      . capecitabine (XELODA) 500 MG tablet Take 2 tablets by mouth twice a day. 120 tablet 0  . carvedilol (COREG) 3.125 MG tablet Take 1 tablet (3.125 mg total) by mouth 2 (two) times daily with a meal. 60 tablet 0  . DILT-XR 180 MG 24 hr capsule Take 180 mg by mouth daily.  1  . ferrous sulfate 325 (65 FE) MG tablet Take 325 mg by mouth 2 (two) times daily with a meal.   0  . furosemide (LASIX) 20 MG tablet Take 1 tablet by mouth daily.  0  . glipiZIDE (GLUCOTROL XL) 5 MG 24 hr tablet Take 5 mg by mouth 2 (two) times daily.    Marland Kitchen lisinopril-hydrochlorothiazide (PRINZIDE,ZESTORETIC) 20-12.5 MG tablet Take 2 tablets by mouth daily.  0  . loperamide (IMODIUM A-D) 2 MG tablet Take 2 mg by mouth 4 (four) times daily as needed for diarrhea or loose stools.    Marland Kitchen  metFORMIN (GLUCOPHAGE) 1000 MG tablet Take 1,000 mg by mouth 2 (two) times daily with a meal.   0  . pantoprazole (PROTONIX) 40 MG tablet Take 1 tablet (40 mg total) by mouth daily. 60 tablet 0   No current facility-administered medications for this visit.     Allergies: No Known Allergies  Past Medical History, Surgical history, Social history, and Family History were reviewed and updated.   Physical Exam: Blood pressure 112/65, pulse 75, temperature 98 F (36.7 C), temperature source Oral, resp. rate 18,  height 6' (1.829 m), weight 177 lb 14.4 oz (80.695 kg), SpO2 100 %. ECOG: 1 General appearance: alert and cooperative.. Comfortable. Head: Normocephalic, without obvious abnormality no oral u thrush. Neck: no adenopathy Lymph nodes: Cervical, supraclavicular, and axillary nodes normal. Heart: Irregular slightly tachycardic. Lung:chest clear, no wheezing, rales, normal symmetric air entry Abdomin: soft, non-tender, without masses or organomegaly no shifting dullness or ascites. EXT:no erythema, induration, or nodules Neurological: No deficits.   Lab Results: Lab Results  Component Value Date   WBC 3.7* 01/15/2015   HGB 9.1* 01/15/2015   HCT 27.6* 01/15/2015   MCV 87.3 01/15/2015   PLT 210 01/15/2015     Chemistry      Component Value Date/Time   NA 138 01/13/2015 0541   NA 141 01/11/2015 0844   K 3.7 01/13/2015 0541   K 3.7 01/11/2015 0844   CL 107 01/13/2015 0541   CO2 22 01/13/2015 0541   CO2 28 01/11/2015 0844   BUN 35* 01/13/2015 0541   BUN 15.9 01/11/2015 0844   CREATININE 0.96 01/13/2015 0541   CREATININE 1.0 01/11/2015 0844      Component Value Date/Time   CALCIUM 8.7* 01/13/2015 0541   CALCIUM 9.4 01/11/2015 0844   ALKPHOS 76 01/11/2015 0844   ALKPHOS 56 12/24/2014 0708   AST 29 01/11/2015 0844   AST 18 12/24/2014 0708   ALT 36 01/11/2015 0844   ALT 12* 12/24/2014 0708   BILITOT 0.51 01/11/2015 0844   BILITOT 0.8 12/24/2014 0708       Impression and Plan:  69 year old gentleman with the following issues:  1. Gastric adenocarcinoma presented with an ulcer in the lesser curvature of the stomach that is biopsy proven. He is status post radiation therapy completed successfully.   PET scan on 03/17/2014 did not show any clear-cut bulky metastasis. He continues to have small gastrohepatic lymph node with some activity that could suggest metastatic disease.  His repeat PET scan in July 2016  showed more progression of disease with rather low volume disease.  He is currently on oral Xeloda and so far reports no major complications.   He did have a bone scan in November 2016 which showed some minor changes likely arthritic in nature. The plan is to repeat a PET/CT scan before the next visit in January 2017. Different salvage regimen will be utilized if he has progression of disease.   2. Iron deficiency anemia: He is status post IV iron in the past and has been repeated periodically. I will repeat iron studies in the near future and use IV iron to replace them as needed. He did develop GI bleed related to coagulopathy and his gastric tumor and he is off anticoagulation at this time.  3. Atrial fibrillation: Off anticoagulation at this time which I agree with. He is only on aspirin which would be reasonable at this time given his risk of bleeding.  4. Follow-up: Will be in one month to follow-up on  his clinical status.  Zola Button, MD 12/22/20162:54 PM

## 2015-03-02 ENCOUNTER — Other Ambulatory Visit: Payer: Self-pay | Admitting: *Deleted

## 2015-03-02 DIAGNOSIS — C163 Malignant neoplasm of pyloric antrum: Secondary | ICD-10-CM

## 2015-03-02 MED ORDER — CAPECITABINE 500 MG PO TABS: ORAL_TABLET | ORAL | Status: DC

## 2015-03-03 DIAGNOSIS — I5032 Chronic diastolic (congestive) heart failure: Secondary | ICD-10-CM | POA: Diagnosis not present

## 2015-03-03 DIAGNOSIS — D500 Iron deficiency anemia secondary to blood loss (chronic): Secondary | ICD-10-CM | POA: Diagnosis not present

## 2015-03-03 DIAGNOSIS — E1122 Type 2 diabetes mellitus with diabetic chronic kidney disease: Secondary | ICD-10-CM | POA: Diagnosis not present

## 2015-03-04 ENCOUNTER — Encounter: Payer: Self-pay | Admitting: Oncology

## 2015-03-04 NOTE — Progress Notes (Signed)
Per [facility] capecitabine was shipped via [employer] 03/03/15

## 2015-03-10 DIAGNOSIS — J449 Chronic obstructive pulmonary disease, unspecified: Secondary | ICD-10-CM | POA: Diagnosis not present

## 2015-03-10 DIAGNOSIS — Z1389 Encounter for screening for other disorder: Secondary | ICD-10-CM | POA: Diagnosis not present

## 2015-03-10 DIAGNOSIS — Z0001 Encounter for general adult medical examination with abnormal findings: Secondary | ICD-10-CM | POA: Diagnosis not present

## 2015-03-10 DIAGNOSIS — F17211 Nicotine dependence, cigarettes, in remission: Secondary | ICD-10-CM | POA: Diagnosis not present

## 2015-03-10 DIAGNOSIS — I69334 Monoplegia of upper limb following cerebral infarction affecting left non-dominant side: Secondary | ICD-10-CM | POA: Diagnosis not present

## 2015-03-10 DIAGNOSIS — E1122 Type 2 diabetes mellitus with diabetic chronic kidney disease: Secondary | ICD-10-CM | POA: Diagnosis not present

## 2015-03-22 ENCOUNTER — Encounter: Payer: Self-pay | Admitting: *Deleted

## 2015-03-22 ENCOUNTER — Other Ambulatory Visit: Payer: Self-pay | Admitting: *Deleted

## 2015-03-22 DIAGNOSIS — C163 Malignant neoplasm of pyloric antrum: Secondary | ICD-10-CM

## 2015-03-22 MED ORDER — CAPECITABINE 500 MG PO TABS: ORAL_TABLET | ORAL | Status: DC

## 2015-03-26 ENCOUNTER — Encounter: Payer: Self-pay | Admitting: Oncology

## 2015-03-26 NOTE — Progress Notes (Signed)
Per [facility] capecitabine shipped via [employer] 03/25/15

## 2015-03-31 ENCOUNTER — Encounter (HOSPITAL_COMMUNITY)
Admission: RE | Admit: 2015-03-31 | Discharge: 2015-03-31 | Disposition: A | Discharge status: Home / Self Care | Payer: Medicare Other | Source: Ambulatory Visit | Attending: Oncology | Admitting: Oncology

## 2015-03-31 ENCOUNTER — Other Ambulatory Visit (HOSPITAL_BASED_OUTPATIENT_CLINIC_OR_DEPARTMENT_OTHER): Payer: Medicare Other

## 2015-03-31 ENCOUNTER — Other Ambulatory Visit: Payer: Self-pay | Admitting: Oncology

## 2015-03-31 DIAGNOSIS — D500 Iron deficiency anemia secondary to blood loss (chronic): Secondary | ICD-10-CM | POA: Diagnosis not present

## 2015-03-31 DIAGNOSIS — C269 Malignant neoplasm of ill-defined sites within the digestive system: Secondary | ICD-10-CM | POA: Diagnosis not present

## 2015-03-31 DIAGNOSIS — C163 Malignant neoplasm of pyloric antrum: Secondary | ICD-10-CM

## 2015-03-31 LAB — CBC WITH DIFFERENTIAL/PLATELET
BASO%: 0.8 % (ref 0.0–2.0)
BASOS ABS: 0.1 10*3/uL (ref 0.0–0.1)
EOS ABS: 0.2 10*3/uL (ref 0.0–0.5)
EOS%: 2.6 % (ref 0.0–7.0)
HEMATOCRIT: 34 % — AB (ref 38.4–49.9)
HEMOGLOBIN: 11 g/dL — AB (ref 13.0–17.1)
LYMPH#: 0.7 10*3/uL — AB (ref 0.9–3.3)
LYMPH%: 10.4 % — ABNORMAL LOW (ref 14.0–49.0)
MCH: 28.6 pg (ref 27.2–33.4)
MCHC: 32.4 g/dL (ref 32.0–36.0)
MCV: 88.5 fL (ref 79.3–98.0)
MONO#: 0.4 10*3/uL (ref 0.1–0.9)
MONO%: 6.7 % (ref 0.0–14.0)
NEUT%: 79.5 % — ABNORMAL HIGH (ref 39.0–75.0)
NEUTROS ABS: 5 10*3/uL (ref 1.5–6.5)
PLATELETS: 294 10*3/uL (ref 140–400)
RBC: 3.84 10*6/uL — ABNORMAL LOW (ref 4.20–5.82)
RDW: 18.9 % — AB (ref 11.0–14.6)
WBC: 6.2 10*3/uL (ref 4.0–10.3)

## 2015-03-31 LAB — COMPREHENSIVE METABOLIC PANEL
ALBUMIN: 4 g/dL (ref 3.5–5.0)
ALK PHOS: 86 U/L (ref 40–150)
ALT: <9 U/L (ref 0–55)
ANION GAP: 9 meq/L (ref 3–11)
AST: 16 U/L (ref 5–34)
BILIRUBIN TOTAL: 0.72 mg/dL (ref 0.20–1.20)
BUN: 20.2 mg/dL (ref 7.0–26.0)
CALCIUM: 9.6 mg/dL (ref 8.4–10.4)
CO2: 27 mEq/L (ref 22–29)
Chloride: 103 mEq/L (ref 98–109)
Creatinine: 1.1 mg/dL (ref 0.7–1.3)
EGFR: 78 mL/min/{1.73_m2} — AB (ref >90)
GLUCOSE: 132 mg/dL (ref 70–140)
POTASSIUM: 4.2 meq/L (ref 3.5–5.1)
Sodium: 139 mEq/L (ref 136–145)
TOTAL PROTEIN: 7.7 g/dL (ref 6.4–8.3)

## 2015-03-31 LAB — IRON AND TIBC
%SAT: 31 % (ref 20–55)
IRON: 116 ug/dL (ref 42–163)
TIBC: 374 ug/dL (ref 202–409)
UIBC: 258 ug/dL (ref 117–376)

## 2015-03-31 LAB — GLUCOSE, CAPILLARY: Glucose-Capillary: 118 mg/dL — ABNORMAL HIGH (ref 65–99)

## 2015-03-31 LAB — FERRITIN: Ferritin: 25 ng/ml (ref 22–316)

## 2015-03-31 MED ORDER — FLUDEOXYGLUCOSE F - 18 (FDG) INJECTION
8.9000 | Freq: Once | INTRAVENOUS | Status: AC | PRN
Start: 2015-03-31 — End: 2015-03-31
  Administered 2015-03-31: 8.9 via INTRAVENOUS

## 2015-04-02 ENCOUNTER — Telehealth: Payer: Self-pay | Admitting: Oncology

## 2015-04-02 ENCOUNTER — Ambulatory Visit (HOSPITAL_BASED_OUTPATIENT_CLINIC_OR_DEPARTMENT_OTHER): Payer: Medicare Other | Admitting: Oncology

## 2015-04-02 VITALS — BP 119/63 | HR 52 | Temp 98.5°F | Resp 18 | Ht 72.0 in | Wt 176.1 lb

## 2015-04-02 DIAGNOSIS — C163 Malignant neoplasm of pyloric antrum: Secondary | ICD-10-CM

## 2015-04-02 NOTE — Progress Notes (Signed)
Hematology and Oncology Follow Up Visit  Patrick [name]:[id number] 04-15-1945 70 y.o. 04/02/2015 2:55 PM Thressa Sheller, MDMackenzie, [name], MD   Principle Diagnosis: 70 year old gentleman with invasive adenocarcinoma of the stomach. He presented with a large elsewhere and iron deficiency anemia related to that in August of 2015. He is status post an endoscopy and a biopsy on 10/21/2013 confirmed the diagnosis. He presented with a hemoglobin of 7.9, MCV 68.8  Prior Therapy:  Status post IV iron 1 g given on 11/04/2013. This was repeated in August 2016. He is status post definitive radiation therapy between 12/03/2013 and 12/30/2013. He received 40 Gray in 20 fractions.  Current therapy: Xeloda at a dose of 1000 mg twice a day 2 weeks on 1 week off started in July 2016.   Interim History: Patrick [name] presents today for a followup visit by himself. Since his last visit, he reports no recent complaints. He does not report any abdominal pain or discomfort. Have not reported any hematochezia or GI bleeding. He continues to eat well and his performance status and quality of life remains adequate at this time.  He reports no complications associated with Xeloda. He continues to receive it without any issue. Does not report any diarrhea, hand-foot syndrome, mucositis.He does not report any nausea, vomiting or abdominal pain. Has not reported any other hospitalization or illnesses.   He does not report any hemoptysis or hematemesis. He is reported headaches or blurred vision or syncope. Does not report any fevers, chills, sweats or weight loss. Does not report any chest pain, orthopnea, palpitations or PND. Does report any cough or wheezing or shortness of breath. He has not reported any urinary symptoms of frequency urgency or hesitancy. He does not report any skeletal complaints of arthralgias or myalgias. He has not reported any lymphadenopathy or petechiae. He reports no skin rashes or lesions. His  performance status still limited but have not declined. Rest of her review of systems unremarkable.      Medications: I have reviewed the patient's current medications.  Current Outpatient Prescriptions  Medication Sig Dispense Refill  . aspirin 81 MG EC tablet Take 81 mg by mouth at bedtime.     Marland Kitchen atorvastatin (LIPITOR) 10 MG tablet Take 10 mg by mouth daily.      . capecitabine (XELODA) 500 MG tablet Take 2 tablets by mouth twice a day. 120 tablet 0  . carvedilol (COREG) 3.125 MG tablet Take 1 tablet (3.125 mg total) by mouth 2 (two) times daily with a meal. 60 tablet 0  . DILT-XR 180 MG 24 hr capsule Take 180 mg by mouth daily.  1  . ferrous sulfate 325 (65 FE) MG tablet Take 325 mg by mouth 2 (two) times daily with a meal.   0  . furosemide (LASIX) 20 MG tablet Take 1 tablet by mouth daily.  0  . glipiZIDE (GLUCOTROL XL) 5 MG 24 hr tablet Take 5 mg by mouth 2 (two) times daily.    Marland Kitchen lisinopril-hydrochlorothiazide (PRINZIDE,ZESTORETIC) 20-12.5 MG tablet Take 2 tablets by mouth daily.  0  . loperamide (IMODIUM A-D) 2 MG tablet Take 2 mg by mouth 4 (four) times daily as needed for diarrhea or loose stools.    . metFORMIN (GLUCOPHAGE) 1000 MG tablet Take 1,000 mg by mouth 2 (two) times daily with a meal.   0  . pantoprazole (PROTONIX) 40 MG tablet Take 1 tablet (40 mg total) by mouth daily. 60 tablet 0   No current facility-administered medications for  this visit.     Allergies: No Known Allergies  Past Medical History, Surgical history, Social history, and Family History were reviewed and updated.   Physical Exam: Blood pressure 119/63, pulse 52, temperature 98.5 F (36.9 C), temperature source Oral, resp. rate 18, height 6' (1.829 m), weight 176 lb 1.6 oz (79.878 kg), SpO2 100 %. ECOG: 1 General appearance: alert and cooperative appeared to without distress. Head: Normocephalic, without obvious abnormality no oral ulcers or lesions. Neck: no adenopathy Lymph nodes: Cervical,  supraclavicular, and axillary nodes normal. Heart: Irregular slightly tachycardic. Lung:chest clear, no wheezing, rales, normal symmetric air entry Abdomin: soft, non-tender, without masses or organomegaly no shifting dullness or ascites. EXT:no erythema, induration, or nodules Neurological: No deficits.   Lab Results: Lab Results  Component Value Date   WBC 6.2 03/31/2015   HGB 11.0* 03/31/2015   HCT 34.0* 03/31/2015   MCV 88.5 03/31/2015   PLT 294 03/31/2015     Chemistry      Component Value Date/Time   NA 139 03/31/2015 1046   NA 138 01/13/2015 0541   K 4.2 03/31/2015 1046   K 3.7 01/13/2015 0541   CL 107 01/13/2015 0541   CO2 27 03/31/2015 1046   CO2 22 01/13/2015 0541   BUN 20.2 03/31/2015 1046   BUN 35* 01/13/2015 0541   CREATININE 1.1 03/31/2015 1046   CREATININE 0.96 01/13/2015 0541      Component Value Date/Time   CALCIUM 9.6 03/31/2015 1046   CALCIUM 8.7* 01/13/2015 0541   ALKPHOS 86 03/31/2015 1046   ALKPHOS 56 12/24/2014 0708   AST 16 03/31/2015 1046   AST 18 12/24/2014 0708   ALT <9 03/31/2015 1046   ALT 12* 12/24/2014 0708   BILITOT 0.72 03/31/2015 1046   BILITOT 0.8 12/24/2014 0708     EXAM: NUCLEAR MEDICINE PET SKULL BASE TO THIGH  TECHNIQUE: 8.9 mCi F-18 FDG was injected intravenously. Full-ring PET imaging was performed from the skull base to thigh after the radiotracer. CT data was obtained and used for attenuation correction and anatomic localization.  FASTING BLOOD GLUCOSE: Value: 118 mg/dl  COMPARISON: PET-CT 09/16/2014.  FINDINGS: NECK  No hypermetabolic lymph nodes in the neck.  CHEST  No hypermetabolic mediastinal or hilar nodes. 3 mm subpleural nodule in the periphery of the inferior segment of the lingula (image 37 of series 8) is unchanged, presumably a subpleural lymph node. No other suspicious pulmonary nodules on the CT scan. Heart size is mildly enlarged. There is no significant pericardial fluid, thickening  or pericardial calcification. There is atherosclerosis of the thoracic aorta, the great vessels of the mediastinum and the coronary arteries, including calcified atherosclerotic plaque in the left main, left anterior descending, left circumflex and right coronary arteries. Esophagus is unremarkable in appearance.  ABDOMEN/PELVIS  Again noted is extensive hypermetabolic thickening of the stomach wall, predominantly in the distal body and antrum. This has increased in thickness compared to the prior examination, currently measuring up to 29 mm in thickness (image 102 of series 4) and is diffusely hypermetabolic (SUVmax = [id number]). Several adjacent hypermetabolic soft tissue nodules have significantly increased in size in the transverse mesocolon, presumably metastatic lymph nodes, largest of which measures 18 x 17 mm (image 103 of series 4). There is also some focal hypermetabolism in the cardia of the stomach (SUVmax = 8.1), which could represent an additional site of malignant gastric involvement, or may simply be physiologic. Previously noted enlarged gastrohepatic ligament lymph node remains mildly enlarged measuring 13 mm  in short axis (image 98 of series 4), and demonstrates persistent low-level hypermetabolism (SUVmax = 4.3). No abnormal hypermetabolic activity within the liver, pancreas, adrenal glands, or spleen. Calcified granulomas in the right lobe of the liver again noted. Right-sided pelvic kidney again incidentally noted. No significant volume of ascites. No pneumoperitoneum. Atherosclerosis throughout the abdominal and pelvic vasculature, without evidence of aneurysm. Hypermetabolism at the level of the anus (SUVmax = 9.9), without discernible mass. Normal appendix.  SKELETON  Small focus of hypermetabolism in the left side of the sacrum at the level of S1 (image 147 of series 4) corresponding to a lucent lesion measuring approximately 2.0 x 1.3 cm. The overall  appearance of this area is similar to prior studies dating back to 10/28/2013 by CT imaging. No other focal hypermetabolic activity to suggest skeletal metastasis.  IMPRESSION: 1. Today's study demonstrates progressive disease with interval enlargement of the large gastric mass, and increasing metastatic lymphadenopathy in the transverse mesocolon, as detailed above. Mildly enlarged hypermetabolic gastrohepatic ligament lymph node is again noted. 2. New focus of hypermetabolism in the cardia of the stomach. It is uncertain whether or not this represents physiologic activity or an additional site of disease in the gastric mucosa. 3. Small focus of hypermetabolism associated with a lucent lesion in the left side of the sacrum at the level of S1. This area is similar in appearance to prior studies on the CT portion of the examination, and may simply be degenerative, however, continued attention on followup studies is recommended to exclude the possibility of osseous metastasis. 4. Small amount of hypermetabolism at the level of the anus, without a clearly discernible mass on CT images. This is favored to be physiologic related to muscular activity, but correlation with physical examination is suggested. 5. Atherosclerosis, including left main and 3 vessel coronary artery disease. 6. Mild cardiomegaly. 7. Additional incidental findings, as above, similar prior studies.   Impression and Plan:  70 year old gentleman with the following issues:  1. Gastric adenocarcinoma presented with an ulcer in the lesser curvature of the stomach that is biopsy proven. He is status post radiation therapy completed successfully.   PET scan on 03/17/2014 did not show any clear-cut bulky metastasis. He continues to have small gastrohepatic lymph node with some activity that could suggest metastatic disease.  PET scan in July 2016  showed more progression of disease with rather low volume disease. He  is currently on oral Xeloda and so far reports no major complications.   PET scan in January 2017 was reviewed today and discussed with the patient. His disease have progressed although he does not have any widespread metastasis. Options of treatment were reviewed today which includes adding intravenous chemotherapy in the form of a platinum agent (cisplatin or oxaliplatin), taxanes or CPT-11. Alternatively, continuous oral single agent therapy could also be considered given the low volume disease and the fact that he is asymptomatic from his cancer.  He understands the goal of therapy is palliative and given the fact that he is asymptomatic from his malignancy and have tolerated oral Xeloda fairly well we have opted to continue on this regimen for the time being. Combination chemotherapy will be used if he develops symptomatic progression.     2. Iron deficiency anemia: He is status post IV iron in the past and has been repeated periodically. Iron studies obtained in January 2017 did not show any deficiency and does not require any intervention.   3. Atrial fibrillation: Off anticoagulation at this time which  I agree with. He is only on aspirin which would be reasonable at this time given his risk of bleeding.  4. Follow-up: Will be in one month to follow-up on his clinical status.  [id number], MD 1/27/20172:55 PM

## 2015-04-02 NOTE — Telephone Encounter (Signed)
per pof to sch pt appt-gave pt copy of avs °

## 2015-04-09 ENCOUNTER — Other Ambulatory Visit: Payer: Self-pay | Admitting: *Deleted

## 2015-04-09 DIAGNOSIS — C163 Malignant neoplasm of pyloric antrum: Secondary | ICD-10-CM

## 2015-04-09 MED ORDER — CAPECITABINE 500 MG PO TABS: ORAL_TABLET | ORAL | Status: DC

## 2015-04-30 ENCOUNTER — Other Ambulatory Visit: Payer: Self-pay | Admitting: *Deleted

## 2015-04-30 DIAGNOSIS — C163 Malignant neoplasm of pyloric antrum: Secondary | ICD-10-CM

## 2015-04-30 MED ORDER — CAPECITABINE 500 MG PO TABS: ORAL_TABLET | ORAL | Status: DC

## 2015-05-12 ENCOUNTER — Telehealth: Payer: Self-pay | Admitting: Oncology

## 2015-05-12 NOTE — Telephone Encounter (Signed)
pt called to confirm appt...done....ok and aware °

## 2015-05-14 ENCOUNTER — Ambulatory Visit (HOSPITAL_BASED_OUTPATIENT_CLINIC_OR_DEPARTMENT_OTHER): Payer: Medicare Other | Admitting: Oncology

## 2015-05-14 ENCOUNTER — Other Ambulatory Visit (HOSPITAL_BASED_OUTPATIENT_CLINIC_OR_DEPARTMENT_OTHER): Payer: Medicare Other

## 2015-05-14 ENCOUNTER — Telehealth: Payer: Self-pay | Admitting: Oncology

## 2015-05-14 VITALS — BP 106/55 | HR 63 | Temp 98.1°F | Resp 18 | Ht 72.0 in | Wt 175.1 lb

## 2015-05-14 DIAGNOSIS — C165 Malignant neoplasm of lesser curvature of stomach, unspecified: Secondary | ICD-10-CM

## 2015-05-14 DIAGNOSIS — C163 Malignant neoplasm of pyloric antrum: Secondary | ICD-10-CM

## 2015-05-14 DIAGNOSIS — D509 Iron deficiency anemia, unspecified: Secondary | ICD-10-CM

## 2015-05-14 DIAGNOSIS — Z7982 Long term (current) use of aspirin: Secondary | ICD-10-CM | POA: Diagnosis not present

## 2015-05-14 LAB — CBC WITH DIFFERENTIAL/PLATELET
BASO%: 1.7 % (ref 0.0–2.0)
Basophils Absolute: 0.1 10*3/uL (ref 0.0–0.1)
EOS%: 3.8 % (ref 0.0–7.0)
Eosinophils Absolute: 0.2 10*3/uL (ref 0.0–0.5)
HCT: 32.1 % — ABNORMAL LOW (ref 38.4–49.9)
HGB: 10.2 g/dL — ABNORMAL LOW (ref 13.0–17.1)
LYMPH%: 14.8 % (ref 14.0–49.0)
MCH: 28.2 pg (ref 27.2–33.4)
MCHC: 31.7 g/dL — ABNORMAL LOW (ref 32.0–36.0)
MCV: 89.1 fL (ref 79.3–98.0)
MONO#: 0.4 10*3/uL (ref 0.1–0.9)
MONO%: 8.3 % (ref 0.0–14.0)
NEUT%: 71.4 % (ref 39.0–75.0)
NEUTROS ABS: 3.7 10*3/uL (ref 1.5–6.5)
PLATELETS: 264 10*3/uL (ref 140–400)
RBC: 3.6 10*6/uL — AB (ref 4.20–5.82)
RDW: 22 % — ABNORMAL HIGH (ref 11.0–14.6)
WBC: 5.1 10*3/uL (ref 4.0–10.3)
lymph#: 0.8 10*3/uL — ABNORMAL LOW (ref 0.9–3.3)

## 2015-05-14 LAB — COMPREHENSIVE METABOLIC PANEL
ANION GAP: 12 meq/L — AB (ref 3–11)
AST: 15 U/L (ref 5–34)
Albumin: 3.6 g/dL (ref 3.5–5.0)
Alkaline Phosphatase: 79 U/L (ref 40–150)
BILIRUBIN TOTAL: 0.3 mg/dL (ref 0.20–1.20)
BUN: 20.1 mg/dL (ref 7.0–26.0)
CO2: 23 meq/L (ref 22–29)
CREATININE: 1.1 mg/dL (ref 0.7–1.3)
Calcium: 9.6 mg/dL (ref 8.4–10.4)
Chloride: 105 mEq/L (ref 98–109)
EGFR: 77 mL/min/{1.73_m2} — ABNORMAL LOW (ref >90)
GLUCOSE: 114 mg/dL (ref 70–140)
Potassium: 4.7 mEq/L (ref 3.5–5.1)
SODIUM: 139 meq/L (ref 136–145)
TOTAL PROTEIN: 7.2 g/dL (ref 6.4–8.3)

## 2015-05-14 NOTE — Progress Notes (Signed)
Hematology and Oncology Follow Up Visit  Patrick [name] [id number] February 25, 1946 70 y.o. 05/14/2015 2:20 PM Patrick [name], MDMackenzie, Patrick Edelman, MD   Principle Diagnosis: 70 year old gentleman with invasive adenocarcinoma of the stomach. He presented with a large elsewhere and iron deficiency anemia related to that in August of 2015. He is status post an endoscopy and a biopsy on 10/21/2013 confirmed the diagnosis. He presented with a hemoglobin of 7.9, MCV 68.8  Prior Therapy:  Status post IV iron 1 g given on 11/04/2013. This was repeated in August 2016. He is status post definitive radiation therapy between 12/03/2013 and 12/30/2013. He received 40 Gray in 20 fractions.  Current therapy: Xeloda at a dose of 1000 mg twice a day 2 weeks on 1 week off started in July 2016.   Interim History: Patrick [name] presents today for a followup visit by himself. Since his last visit, he continues to do well without any recent issues. He denied any abdominal pain, hematochezia or melena. His appetite is excellent and have maintained good weight. His performance status and quality of life remains adequate at this time.  He continues to take Xeloda without any new side effects. Does not report any diarrhea, hand-foot syndrome, mucositis.He does not report any nausea, vomiting or abdominal pain. Has not reported any other hospitalization or illnesses.   He does not report any hemoptysis or hematemesis. He is reported headaches or blurred vision or syncope. Does not report any fevers, chills, sweats or weight loss. Does not report any chest pain, orthopnea, palpitations or PND. Does report any cough or wheezing or shortness of breath. He has not reported any urinary symptoms of frequency urgency or hesitancy. He does not report any skeletal complaints of arthralgias or myalgias. He has not reported any lymphadenopathy or petechiae. He reports no skin rashes or lesions. His performance status still limited but have  not declined. Rest of her review of systems unremarkable.      Medications: I have reviewed the patient's current medications.  Current Outpatient Prescriptions  Medication Sig Dispense Refill  . aspirin 81 MG EC tablet Take 81 mg by mouth at bedtime.     Marland Kitchen atorvastatin (LIPITOR) 10 MG tablet Take 10 mg by mouth daily.      . capecitabine (XELODA) 500 MG tablet Take 2 tablets by mouth twice daily after meals for 2 weeks on, then one week off. 56 tablet 0  . carvedilol (COREG) 3.125 MG tablet Take 1 tablet (3.125 mg total) by mouth 2 (two) times daily with a meal. 60 tablet 0  . DILT-XR 180 MG 24 hr capsule Take 180 mg by mouth daily.  1  . ferrous sulfate 325 (65 FE) MG tablet Take 325 mg by mouth 2 (two) times daily with a meal.   0  . furosemide (LASIX) 20 MG tablet Take 1 tablet by mouth daily.  0  . glipiZIDE (GLUCOTROL XL) 5 MG 24 hr tablet Take 5 mg by mouth 2 (two) times daily.    Marland Kitchen lisinopril-hydrochlorothiazide (PRINZIDE,ZESTORETIC) 20-12.5 MG tablet Take 2 tablets by mouth daily.  0  . loperamide (IMODIUM A-D) 2 MG tablet Take 2 mg by mouth 4 (four) times daily as needed for diarrhea or loose stools.    . metFORMIN (GLUCOPHAGE) 1000 MG tablet Take 1,000 mg by mouth 2 (two) times daily with a meal.   0  . pantoprazole (PROTONIX) 40 MG tablet Take 1 tablet (40 mg total) by mouth daily. 60 tablet 0   No current facility-administered  medications for this visit.     Allergies: No Known Allergies  Past Medical History, Surgical history, Social history, and Family History were reviewed and updated.   Physical Exam: Blood pressure 106/55, pulse 63, temperature 98.1 F (36.7 C), temperature source Oral, resp. rate 18, height 6' (1.829 m), weight 175 lb 1.6 oz (79.425 kg), SpO2 100 %. ECOG: 1 General appearance: alert and cooperative not in any distress. Head: Normocephalic, without obvious abnormality no oral thrush. Neck: no adenopathy Lymph nodes: Cervical, supraclavicular,  and axillary nodes normal. Heart: Irregular slightly tachycardic. Lung:chest clear, no wheezing, rales, normal symmetric air entry Abdomin: soft, non-tender, without masses or organomegaly no rebound or guarding. EXT:no erythema, induration, or nodules Neurological: No deficits.   Lab Results: Lab Results  Component Value Date   WBC 5.1 05/14/2015   HGB 10.2* 05/14/2015   HCT 32.1* 05/14/2015   MCV 89.1 05/14/2015   PLT 264 05/14/2015     Chemistry      Component Value Date/Time   NA 139 03/31/2015 1046   NA 138 01/13/2015 0541   K 4.2 03/31/2015 1046   K 3.7 01/13/2015 0541   CL 107 01/13/2015 0541   CO2 27 03/31/2015 1046   CO2 22 01/13/2015 0541   BUN 20.2 03/31/2015 1046   BUN 35* 01/13/2015 0541   CREATININE 1.1 03/31/2015 1046   CREATININE 0.96 01/13/2015 0541      Component Value Date/Time   CALCIUM 9.6 03/31/2015 1046   CALCIUM 8.7* 01/13/2015 0541   ALKPHOS 86 03/31/2015 1046   ALKPHOS 56 12/24/2014 0708   AST 16 03/31/2015 1046   AST 18 12/24/2014 0708   ALT <9 03/31/2015 1046   ALT 12* 12/24/2014 0708   BILITOT 0.72 03/31/2015 1046   BILITOT 0.8 12/24/2014 0708     E Impression and Plan:  70 year old gentleman with the following issues:  1. Gastric adenocarcinoma presented with an ulcer in the lesser curvature of the stomach that is biopsy proven. He is status post radiation therapy completed successfully.   PET scan on 03/17/2014 did not show any clear-cut bulky metastasis. He continues to have small gastrohepatic lymph node with some activity that could suggest metastatic disease.  PET scan in July 2016  showed more progression of disease with rather low volume disease. He is currently on oral Xeloda and so far reports no major complications.   PET scan in January 2017 showed mild progression without widespread disease. He elected to continue with Xeloda for palliative purposes given his excellent tolerance and good quality of life.  He  understands the goal of therapy is palliative and given the fact that he is asymptomatic from his malignancy. Combination chemotherapy will be used if he develops symptomatic progression.    2. Iron deficiency anemia: He is status post IV iron in the past and has been repeated periodically. Iron studies obtained in January 2017 did not show any deficiency and does not require any intervention. His hemoglobin appears adequate at this time and we'll continue to monitor.   3. Atrial fibrillation: Off anticoagulation at this time which I agree with. He is only on aspirin which would be reasonable at this time given his risk of bleeding.  4. Follow-up: Will be in one month to follow-up on his clinical status.  Zola Button, MD 3/10/20172:20 PM

## 2015-05-14 NOTE — Telephone Encounter (Signed)
Gave and printed appt sched and avs for pt for April °

## 2015-05-24 ENCOUNTER — Other Ambulatory Visit: Payer: Self-pay | Admitting: *Deleted

## 2015-05-24 DIAGNOSIS — C163 Malignant neoplasm of pyloric antrum: Secondary | ICD-10-CM

## 2015-05-24 MED ORDER — CAPECITABINE 500 MG PO TABS: ORAL_TABLET | ORAL | Status: DC

## 2015-06-09 ENCOUNTER — Other Ambulatory Visit: Payer: Self-pay | Admitting: Cardiology

## 2015-06-09 DIAGNOSIS — D500 Iron deficiency anemia secondary to blood loss (chronic): Secondary | ICD-10-CM

## 2015-06-09 DIAGNOSIS — C163 Malignant neoplasm of pyloric antrum: Secondary | ICD-10-CM

## 2015-06-09 MED ORDER — DILT-XR 180 MG PO CP24: 180.0000 mg | ORAL_CAPSULE | Freq: Every day | ORAL | Status: DC

## 2015-06-09 NOTE — Telephone Encounter (Signed)
Rx(s) sent to pharmacy electronically.  

## 2015-06-11 ENCOUNTER — Other Ambulatory Visit: Payer: Self-pay | Admitting: *Deleted

## 2015-06-11 DIAGNOSIS — C163 Malignant neoplasm of pyloric antrum: Secondary | ICD-10-CM

## 2015-06-11 MED ORDER — CAPECITABINE 500 MG PO TABS: ORAL_TABLET | ORAL | Status: DC

## 2015-06-17 ENCOUNTER — Other Ambulatory Visit (HOSPITAL_BASED_OUTPATIENT_CLINIC_OR_DEPARTMENT_OTHER): Payer: Medicare Other

## 2015-06-17 ENCOUNTER — Telehealth: Payer: Self-pay | Admitting: Oncology

## 2015-06-17 ENCOUNTER — Ambulatory Visit (HOSPITAL_BASED_OUTPATIENT_CLINIC_OR_DEPARTMENT_OTHER): Payer: Medicare Other | Admitting: Oncology

## 2015-06-17 VITALS — BP 107/53 | HR 81 | Temp 97.9°F | Resp 18 | Ht 72.0 in | Wt 166.5 lb

## 2015-06-17 DIAGNOSIS — C163 Malignant neoplasm of pyloric antrum: Secondary | ICD-10-CM

## 2015-06-17 DIAGNOSIS — D500 Iron deficiency anemia secondary to blood loss (chronic): Secondary | ICD-10-CM

## 2015-06-17 DIAGNOSIS — C165 Malignant neoplasm of lesser curvature of stomach, unspecified: Secondary | ICD-10-CM

## 2015-06-17 LAB — COMPREHENSIVE METABOLIC PANEL
ANION GAP: 11 meq/L (ref 3–11)
AST: 15 U/L (ref 5–34)
Albumin: 3.6 g/dL (ref 3.5–5.0)
Alkaline Phosphatase: 75 U/L (ref 40–150)
BUN: 54 mg/dL — ABNORMAL HIGH (ref 7.0–26.0)
CALCIUM: 9.7 mg/dL (ref 8.4–10.4)
CO2: 20 mEq/L — ABNORMAL LOW (ref 22–29)
CREATININE: 1.6 mg/dL — AB (ref 0.7–1.3)
Chloride: 105 mEq/L (ref 98–109)
EGFR: 51 mL/min/{1.73_m2} — ABNORMAL LOW (ref >90)
Glucose: 161 mg/dl — ABNORMAL HIGH (ref 70–140)
Potassium: 5 mEq/L (ref 3.5–5.1)
Sodium: 137 mEq/L (ref 136–145)
Total Bilirubin: <0.3 mg/dL (ref 0.20–1.20)
Total Protein: 7.3 g/dL (ref 6.4–8.3)

## 2015-06-17 LAB — CBC WITH DIFFERENTIAL/PLATELET
BASO%: 0.5 % (ref 0.0–2.0)
BASOS ABS: 0 10*3/uL (ref 0.0–0.1)
EOS ABS: 0.3 10*3/uL (ref 0.0–0.5)
EOS%: 5.9 % (ref 0.0–7.0)
HEMATOCRIT: 28.7 % — AB (ref 38.4–49.9)
HGB: 9.5 g/dL — ABNORMAL LOW (ref 13.0–17.1)
LYMPH#: 0.8 10*3/uL — AB (ref 0.9–3.3)
LYMPH%: 14.4 % (ref 14.0–49.0)
MCH: 29.5 pg (ref 27.2–33.4)
MCHC: 33.1 g/dL (ref 32.0–36.0)
MCV: 89.1 fL (ref 79.3–98.0)
MONO#: 0.4 10*3/uL (ref 0.1–0.9)
MONO%: 7.9 % (ref 0.0–14.0)
NEUT#: 4 10*3/uL (ref 1.5–6.5)
NEUT%: 71.3 % (ref 39.0–75.0)
PLATELETS: 373 10*3/uL (ref 140–400)
RBC: 3.22 10*6/uL — ABNORMAL LOW (ref 4.20–5.82)
RDW: 21.4 % — ABNORMAL HIGH (ref 11.0–14.6)
WBC: 5.6 10*3/uL (ref 4.0–10.3)

## 2015-06-17 NOTE — Progress Notes (Signed)
Hematology and Oncology Follow Up Visit  Patrick [name] [id number] 07-29-1945 70 y.o. 06/17/2015 1:06 PM Thressa Sheller, MDMackenzie, Patrick Edelman, MD   Principle Diagnosis: Patrick [name] with invasive adenocarcinoma of the stomach. He presented with a large elsewhere and iron deficiency anemia related to that in August of 2015. He is status post an endoscopy and a biopsy on 10/21/2013 confirmed the diagnosis. He presented with a hemoglobin of 7.9, MCV 68.8  Prior Therapy:  Status post IV iron 1 g given on 11/04/2013. This was repeated in August 2016. He is status post definitive radiation therapy between 12/03/2013 and 12/30/2013. He received 40 Gray in 20 fractions.  Current therapy: Xeloda at a dose of 1000 mg twice a day 2 weeks on 1 week off started in July 2016.   Interim History: Patrick [name] presents today for a followup visit by himself. Since his last visit, he developed gastrointestinal illness including vomiting and diarrhea last week. The symptoms have resolved at this time but caused him to decreased by mouth intake and lost close to 9 pounds. His appetite is improving and have resumed almost all of his ability to eat and keep food down. He denied any abdominal pain, hematochezia or melena.  He continues to take Xeloda without any new side effects. Despite his acute illness, he continued taken it. Does not report any diarrhea, hand-foot syndrome, mucositis.He does not report any nausea, vomiting or abdominal pain. Has not reported any other hospitalization or illnesses.   He does not report any hemoptysis or hematemesis. He is reported headaches or blurred vision or syncope. Does not report any fevers, chills, sweats.. Does not report any chest pain, orthopnea, palpitations or PND. Does report any cough or wheezing or shortness of breath. He has not reported any urinary symptoms of frequency urgency or hesitancy. He does not report any skeletal complaints of arthralgias or  myalgias. He has not reported any lymphadenopathy or petechiae. He reports no skin rashes or lesions. His performance status still limited but have not declined. Rest of her review of systems unremarkable.      Medications: I have reviewed the patient's current medications.  Current Outpatient Prescriptions  Medication Sig Dispense Refill  . aspirin 81 MG EC tablet Take 81 mg by mouth at bedtime.     Marland Kitchen atorvastatin (LIPITOR) 10 MG tablet Take 10 mg by mouth daily.      . capecitabine (XELODA) 500 MG tablet Take 2 tablets by mouth twice daily after meals for 2 weeks on, then one week off. 56 tablet 0  . carvedilol (COREG) 3.125 MG tablet Take 1 tablet (3.125 mg total) by mouth 2 (two) times daily with a meal. Patrick tablet 0  . DILT-XR 180 MG 24 hr capsule Take 1 capsule (180 mg total) by mouth daily. 30 capsule 5  . ferrous sulfate 325 (65 FE) MG tablet Take 325 mg by mouth 2 (two) times daily with a meal.   0  . furosemide (LASIX) 20 MG tablet Take 1 tablet by mouth daily.  0  . glipiZIDE (GLUCOTROL XL) 5 MG 24 hr tablet Take 5 mg by mouth 2 (two) times daily.    Marland Kitchen lisinopril-hydrochlorothiazide (PRINZIDE,ZESTORETIC) 20-12.5 MG tablet Take 2 tablets by mouth daily.  0  . loperamide (IMODIUM A-D) 2 MG tablet Take 2 mg by mouth 4 (four) times daily as needed for diarrhea or loose stools.    . metFORMIN (GLUCOPHAGE) 1000 MG tablet Take 1,000 mg by mouth 2 (two) times daily with a  meal.   0  . pantoprazole (PROTONIX) 40 MG tablet Take 1 tablet (40 mg total) by mouth daily. Patrick tablet 0   No current facility-administered medications for this visit.     Allergies: No Known Allergies  Past Medical History, Surgical history, Social history, and Family History were reviewed and updated.   Physical Exam: Blood pressure 107/53, pulse 81, temperature 97.9 F (36.6 C), temperature source Oral, resp. rate 18, height 6' (1.829 m), weight 166 lb 8 oz (75.524 kg), SpO2 100 %. ECOG: 1 General appearance:  Patrick [name], Patrick [name] without distress. Head: Normocephalic, without obvious abnormality no oral thrush noted. Neck: no adenopathy Lymph nodes: Cervical, supraclavicular, and axillary nodes normal. Heart: Irregular slightly tachycardic. Lung:chest clear, no wheezing, rales, normal symmetric air entry Abdomin: soft, non-tender, without masses or organomegaly no shifting dullness or ascites. EXT:no erythema, induration, or nodules Neurological: No deficits.   Lab Results: Lab Results  Component Value Date   WBC 5.6 06/17/2015   HGB 9.5* 06/17/2015   HCT 28.7* 06/17/2015   MCV 89.1 06/17/2015   PLT 373 06/17/2015     Chemistry      Component Value Date/Time   NA 139 05/14/2015 1401   NA 138 01/13/2015 0541   K 4.7 05/14/2015 1401   K 3.7 01/13/2015 0541   CL 107 01/13/2015 0541   CO2 23 05/14/2015 1401   CO2 22 01/13/2015 0541   BUN 20.1 05/14/2015 1401   BUN 35* 01/13/2015 0541   CREATININE 1.1 05/14/2015 1401   CREATININE 0.96 01/13/2015 0541      Component Value Date/Time   CALCIUM 9.6 05/14/2015 1401   CALCIUM 8.7* 01/13/2015 0541   ALKPHOS 79 05/14/2015 1401   ALKPHOS 56 12/24/2014 0708   AST 15 05/14/2015 1401   AST 18 12/24/2014 0708   ALT <9 05/14/2015 1401   ALT 12* 12/24/2014 0708   BILITOT 0.30 05/14/2015 1401   BILITOT 0.8 12/24/2014 0708      Impression and Plan:  Patrick [name] with the following issues:  1. Gastric adenocarcinoma presented with an ulcer in the lesser curvature of the stomach that is biopsy proven. He is status post radiation therapy completed successfully.   PET scan on 03/17/2014 did not show any clear-cut bulky metastasis. He continues to have small gastrohepatic lymph node with some activity that could suggest metastatic disease.  PET scan in July 2016  showed more progression of disease with rather low volume disease. He is currently on oral Xeloda and so far reports no major complications.   PET scan in January  2017 showed mild progression without widespread disease. He elected to continue with Xeloda for palliative purposes given his excellent tolerance and good quality of life.  The plan is to continue with the same dose and schedule of Xeloda given his excellent tolerance. The plan is to repeat imaging studies in July 2017. She will be done sooner if he develops any new symptoms.    2. Iron deficiency anemia: He is status post IV iron in the past and has been repeated periodically. Iron studies obtained in January 2017 did not show any deficiency and does not require any intervention. His hemoglobin shows slight decrease, I will check his iron studies with the next visit to ensure stability. If his iron is low we will retreat him with IV iron which she have received in the past.   3. Atrial fibrillation: Off anticoagulation at this time which I agree with. He is only on aspirin  which would be reasonable at this time given his risk of bleeding.  4. Follow-up: Will be in one month to follow-up on his clinical status.  [facility], MD 4/13/20171:06 PM

## 2015-06-17 NOTE — Telephone Encounter (Signed)
Gave and printed appt sched and avs for pt for may °

## 2015-07-02 ENCOUNTER — Other Ambulatory Visit: Payer: Self-pay | Admitting: *Deleted

## 2015-07-02 DIAGNOSIS — C163 Malignant neoplasm of pyloric antrum: Secondary | ICD-10-CM

## 2015-07-02 MED ORDER — CAPECITABINE 500 MG PO TABS: ORAL_TABLET | ORAL | Status: DC

## 2015-07-17 ENCOUNTER — Other Ambulatory Visit (HOSPITAL_COMMUNITY): Payer: Medicare Other

## 2015-07-17 ENCOUNTER — Emergency Department (HOSPITAL_COMMUNITY): Payer: Medicare Other

## 2015-07-17 ENCOUNTER — Inpatient Hospital Stay (HOSPITAL_COMMUNITY): Payer: Medicare Other

## 2015-07-17 ENCOUNTER — Inpatient Hospital Stay (HOSPITAL_COMMUNITY)
Admission: EM | Admit: 2015-07-17 | Discharge: 2015-07-26 | DRG: 166 | Disposition: A | Discharge status: Hospice | Payer: Medicare Other | Attending: Internal Medicine | Admitting: Internal Medicine

## 2015-07-17 ENCOUNTER — Encounter (HOSPITAL_COMMUNITY): Payer: Self-pay

## 2015-07-17 DIAGNOSIS — R066 Hiccough: Secondary | ICD-10-CM | POA: Diagnosis present

## 2015-07-17 DIAGNOSIS — E785 Hyperlipidemia, unspecified: Secondary | ICD-10-CM | POA: Diagnosis present

## 2015-07-17 DIAGNOSIS — Z9841 Cataract extraction status, right eye: Secondary | ICD-10-CM

## 2015-07-17 DIAGNOSIS — C163 Malignant neoplasm of pyloric antrum: Secondary | ICD-10-CM | POA: Diagnosis present

## 2015-07-17 DIAGNOSIS — Z7984 Long term (current) use of oral hypoglycemic drugs: Secondary | ICD-10-CM | POA: Diagnosis not present

## 2015-07-17 DIAGNOSIS — Z9842 Cataract extraction status, left eye: Secondary | ICD-10-CM

## 2015-07-17 DIAGNOSIS — I272 Other secondary pulmonary hypertension: Secondary | ICD-10-CM | POA: Diagnosis present

## 2015-07-17 DIAGNOSIS — C169 Malignant neoplasm of stomach, unspecified: Secondary | ICD-10-CM | POA: Diagnosis not present

## 2015-07-17 DIAGNOSIS — D500 Iron deficiency anemia secondary to blood loss (chronic): Secondary | ICD-10-CM | POA: Diagnosis not present

## 2015-07-17 DIAGNOSIS — R791 Abnormal coagulation profile: Secondary | ICD-10-CM

## 2015-07-17 DIAGNOSIS — R000 Tachycardia, unspecified: Secondary | ICD-10-CM | POA: Diagnosis not present

## 2015-07-17 DIAGNOSIS — Z8673 Personal history of transient ischemic attack (TIA), and cerebral infarction without residual deficits: Secondary | ICD-10-CM

## 2015-07-17 DIAGNOSIS — Z79899 Other long term (current) drug therapy: Secondary | ICD-10-CM

## 2015-07-17 DIAGNOSIS — R9431 Abnormal electrocardiogram [ECG] [EKG]: Secondary | ICD-10-CM | POA: Insufficient documentation

## 2015-07-17 DIAGNOSIS — I482 Chronic atrial fibrillation, unspecified: Secondary | ICD-10-CM

## 2015-07-17 DIAGNOSIS — K921 Melena: Secondary | ICD-10-CM | POA: Diagnosis not present

## 2015-07-17 DIAGNOSIS — C779 Secondary and unspecified malignant neoplasm of lymph node, unspecified: Secondary | ICD-10-CM | POA: Diagnosis present

## 2015-07-17 DIAGNOSIS — N170 Acute kidney failure with tubular necrosis: Secondary | ICD-10-CM | POA: Diagnosis present

## 2015-07-17 DIAGNOSIS — Z515 Encounter for palliative care: Secondary | ICD-10-CM | POA: Diagnosis not present

## 2015-07-17 DIAGNOSIS — K311 Adult hypertrophic pyloric stenosis: Secondary | ICD-10-CM | POA: Diagnosis not present

## 2015-07-17 DIAGNOSIS — D649 Anemia, unspecified: Secondary | ICD-10-CM | POA: Diagnosis present

## 2015-07-17 DIAGNOSIS — I429 Cardiomyopathy, unspecified: Secondary | ICD-10-CM | POA: Diagnosis not present

## 2015-07-17 DIAGNOSIS — Z923 Personal history of irradiation: Secondary | ICD-10-CM | POA: Diagnosis not present

## 2015-07-17 DIAGNOSIS — I2609 Other pulmonary embolism with acute cor pulmonale: Principal | ICD-10-CM | POA: Diagnosis present

## 2015-07-17 DIAGNOSIS — E871 Hypo-osmolality and hyponatremia: Secondary | ICD-10-CM | POA: Diagnosis not present

## 2015-07-17 DIAGNOSIS — I4581 Long QT syndrome: Secondary | ICD-10-CM | POA: Diagnosis present

## 2015-07-17 DIAGNOSIS — E872 Acidosis: Secondary | ICD-10-CM | POA: Diagnosis not present

## 2015-07-17 DIAGNOSIS — R0602 Shortness of breath: Secondary | ICD-10-CM

## 2015-07-17 DIAGNOSIS — I82819 Embolism and thrombosis of superficial veins of unspecified lower extremities: Secondary | ICD-10-CM | POA: Diagnosis present

## 2015-07-17 DIAGNOSIS — R069 Unspecified abnormalities of breathing: Secondary | ICD-10-CM | POA: Diagnosis not present

## 2015-07-17 DIAGNOSIS — IMO0002 Reserved for concepts with insufficient information to code with codable children: Secondary | ICD-10-CM | POA: Diagnosis present

## 2015-07-17 DIAGNOSIS — Z7189 Other specified counseling: Secondary | ICD-10-CM | POA: Diagnosis present

## 2015-07-17 DIAGNOSIS — N179 Acute kidney failure, unspecified: Secondary | ICD-10-CM | POA: Diagnosis not present

## 2015-07-17 DIAGNOSIS — R570 Cardiogenic shock: Secondary | ICD-10-CM | POA: Diagnosis not present

## 2015-07-17 DIAGNOSIS — D689 Coagulation defect, unspecified: Secondary | ICD-10-CM | POA: Diagnosis not present

## 2015-07-17 DIAGNOSIS — E860 Dehydration: Secondary | ICD-10-CM | POA: Diagnosis not present

## 2015-07-17 DIAGNOSIS — I2699 Other pulmonary embolism without acute cor pulmonale: Secondary | ICD-10-CM | POA: Diagnosis not present

## 2015-07-17 DIAGNOSIS — I10 Essential (primary) hypertension: Secondary | ICD-10-CM | POA: Diagnosis not present

## 2015-07-17 DIAGNOSIS — E118 Type 2 diabetes mellitus with unspecified complications: Secondary | ICD-10-CM | POA: Diagnosis present

## 2015-07-17 DIAGNOSIS — E1165 Type 2 diabetes mellitus with hyperglycemia: Secondary | ICD-10-CM | POA: Diagnosis present

## 2015-07-17 DIAGNOSIS — I959 Hypotension, unspecified: Secondary | ICD-10-CM | POA: Diagnosis present

## 2015-07-17 DIAGNOSIS — Z961 Presence of intraocular lens: Secondary | ICD-10-CM | POA: Diagnosis present

## 2015-07-17 DIAGNOSIS — D509 Iron deficiency anemia, unspecified: Secondary | ICD-10-CM | POA: Diagnosis present

## 2015-07-17 DIAGNOSIS — I481 Persistent atrial fibrillation: Secondary | ICD-10-CM | POA: Diagnosis present

## 2015-07-17 DIAGNOSIS — D62 Acute posthemorrhagic anemia: Secondary | ICD-10-CM | POA: Diagnosis present

## 2015-07-17 DIAGNOSIS — R0600 Dyspnea, unspecified: Secondary | ICD-10-CM | POA: Diagnosis not present

## 2015-07-17 DIAGNOSIS — Z66 Do not resuscitate: Secondary | ICD-10-CM | POA: Diagnosis not present

## 2015-07-17 DIAGNOSIS — K920 Hematemesis: Secondary | ICD-10-CM | POA: Diagnosis present

## 2015-07-17 DIAGNOSIS — E43 Unspecified severe protein-calorie malnutrition: Secondary | ICD-10-CM | POA: Diagnosis not present

## 2015-07-17 DIAGNOSIS — Z7982 Long term (current) use of aspirin: Secondary | ICD-10-CM | POA: Diagnosis not present

## 2015-07-17 DIAGNOSIS — I82409 Acute embolism and thrombosis of unspecified deep veins of unspecified lower extremity: Secondary | ICD-10-CM

## 2015-07-17 DIAGNOSIS — Z87891 Personal history of nicotine dependence: Secondary | ICD-10-CM | POA: Diagnosis not present

## 2015-07-17 DIAGNOSIS — I82812 Embolism and thrombosis of superficial veins of left lower extremities: Secondary | ICD-10-CM | POA: Diagnosis present

## 2015-07-17 DIAGNOSIS — I480 Paroxysmal atrial fibrillation: Secondary | ICD-10-CM | POA: Diagnosis not present

## 2015-07-17 DIAGNOSIS — Z85028 Personal history of other malignant neoplasm of stomach: Secondary | ICD-10-CM

## 2015-07-17 DIAGNOSIS — I9589 Other hypotension: Secondary | ICD-10-CM | POA: Diagnosis not present

## 2015-07-17 DIAGNOSIS — R112 Nausea with vomiting, unspecified: Secondary | ICD-10-CM | POA: Diagnosis not present

## 2015-07-17 LAB — PREPARE RBC (CROSSMATCH)

## 2015-07-17 LAB — BASIC METABOLIC PANEL
ANION GAP: 15 (ref 5–15)
BUN: 82 mg/dL — ABNORMAL HIGH (ref 6–20)
CALCIUM: 7 mg/dL — AB (ref 8.9–10.3)
CO2: 10 mmol/L — AB (ref 22–32)
CREATININE: 1.99 mg/dL — AB (ref 0.61–1.24)
Chloride: 106 mmol/L (ref 101–111)
GFR, EST AFRICAN AMERICAN: 37 mL/min — AB (ref >60)
GFR, EST NON AFRICAN AMERICAN: 32 mL/min — AB (ref >60)
Glucose, Bld: 157 mg/dL — ABNORMAL HIGH (ref 65–99)
Potassium: 4.4 mmol/L (ref 3.5–5.1)
SODIUM: 131 mmol/L — AB (ref 135–145)

## 2015-07-17 LAB — CBC
HCT: 25.4 % — ABNORMAL LOW (ref 39.0–52.0)
HEMATOCRIT: 18.2 % — AB (ref 39.0–52.0)
HEMOGLOBIN: 6.1 g/dL — AB (ref 13.0–17.0)
Hemoglobin: 8.5 g/dL — ABNORMAL LOW (ref 13.0–17.0)
MCH: 30.2 pg (ref 26.0–34.0)
MCH: 29.5 pg (ref 26.0–34.0)
MCHC: 33.5 g/dL (ref 30.0–36.0)
MCHC: 33.5 g/dL (ref 30.0–36.0)
MCV: 90.1 fL (ref 78.0–100.0)
MCV: 88.2 fL (ref 78.0–100.0)
Platelets: 364 10*3/uL (ref 150–400)
Platelets: 363 10*3/uL (ref 150–400)
RBC: 2.02 MIL/uL — AB (ref 4.22–5.81)
RBC: 2.88 MIL/uL — ABNORMAL LOW (ref 4.22–5.81)
RDW: 23.3 % — ABNORMAL HIGH (ref 11.5–15.5)
RDW: 19.9 % — AB (ref 11.5–15.5)
WBC: 5 10*3/uL (ref 4.0–10.5)
WBC: 5.6 10*3/uL (ref 4.0–10.5)

## 2015-07-17 LAB — POCT I-STAT 3, ART BLOOD GAS (G3+)
ACID-BASE DEFICIT: 12 mmol/L — AB (ref 0.0–2.0)
ACID-BASE DEFICIT: 12 mmol/L — AB (ref 0.0–2.0)
BICARBONATE: 12.6 meq/L — AB (ref 20.0–24.0)
Bicarbonate: 12.8 mEq/L — ABNORMAL LOW (ref 20.0–24.0)
O2 SAT: 90 %
O2 Saturation: 98 %
TCO2: 13 mmol/L (ref 0–100)
TCO2: 13 mmol/L (ref 0–100)
pCO2 arterial: 24 mmHg — ABNORMAL LOW (ref 35.0–45.0)
pCO2 arterial: 22.5 mmHg — ABNORMAL LOW (ref 35.0–45.0)
pH, Arterial: 7.332 — ABNORMAL LOW (ref 7.350–7.450)
pH, Arterial: 7.355 (ref 7.350–7.450)
pO2, Arterial: 61 mmHg — ABNORMAL LOW (ref 80.0–100.0)
pO2, Arterial: 112 mmHg — ABNORMAL HIGH (ref 80.0–100.0)

## 2015-07-17 LAB — I-STAT ARTERIAL BLOOD GAS, ED
ACID-BASE DEFICIT: 10 mmol/L — AB (ref 0.0–2.0)
Acid-base deficit: 10 mmol/L — ABNORMAL HIGH (ref 0.0–2.0)
BICARBONATE: 14.7 meq/L — AB (ref 20.0–24.0)
BICARBONATE: 15.1 meq/L — AB (ref 20.0–24.0)
O2 Saturation: 96 %
O2 Saturation: 96 %
PCO2 ART: 28.4 mmHg — AB (ref 35.0–45.0)
PH ART: 7.342 — AB (ref 7.350–7.450)
Patient temperature: 97.7
TCO2: 16 mmol/L (ref 0–100)
TCO2: 16 mmol/L (ref 0–100)
pCO2 arterial: 27.7 mmHg — ABNORMAL LOW (ref 35.0–45.0)
pH, Arterial: 7.323 — ABNORMAL LOW (ref 7.350–7.450)
pO2, Arterial: 86 mmHg (ref 80.0–100.0)
pO2, Arterial: 81 mmHg (ref 80.0–100.0)

## 2015-07-17 LAB — BRAIN NATRIURETIC PEPTIDE: B Natriuretic Peptide: 313.6 pg/mL — ABNORMAL HIGH (ref 0.0–100.0)

## 2015-07-17 LAB — COMPREHENSIVE METABOLIC PANEL
ALT: 13 U/L — AB (ref 17–63)
AST: 25 U/L (ref 15–41)
Albumin: 3.2 g/dL — ABNORMAL LOW (ref 3.5–5.0)
Alkaline Phosphatase: 57 U/L (ref 38–126)
Anion gap: 17 — ABNORMAL HIGH (ref 5–15)
BUN: 97 mg/dL — AB (ref 6–20)
CHLORIDE: 95 mmol/L — AB (ref 101–111)
CO2: 17 mmol/L — AB (ref 22–32)
CREATININE: 2.47 mg/dL — AB (ref 0.61–1.24)
Calcium: 8.7 mg/dL — ABNORMAL LOW (ref 8.9–10.3)
GFR calc Af Amer: 29 mL/min — ABNORMAL LOW (ref >60)
GFR, EST NON AFRICAN AMERICAN: 25 mL/min — AB (ref >60)
GLUCOSE: 213 mg/dL — AB (ref 65–99)
Potassium: 4.9 mmol/L (ref 3.5–5.1)
Sodium: 129 mmol/L — ABNORMAL LOW (ref 135–145)
Total Bilirubin: 0.7 mg/dL (ref 0.3–1.2)
Total Protein: 6.6 g/dL (ref 6.5–8.1)

## 2015-07-17 LAB — PHOSPHORUS: PHOSPHORUS: 4.8 mg/dL — AB (ref 2.5–4.6)

## 2015-07-17 LAB — IRON AND TIBC
Iron: 122 ug/dL (ref 45–182)
SATURATION RATIOS: 37 % (ref 17.9–39.5)
TIBC: 329 ug/dL (ref 250–450)
UIBC: 207 ug/dL

## 2015-07-17 LAB — CBC WITH DIFFERENTIAL/PLATELET
BASOS PCT: 0 %
Basophils Absolute: 0 10*3/uL (ref 0.0–0.1)
EOS PCT: 0 %
Eosinophils Absolute: 0 10*3/uL (ref 0.0–0.7)
HCT: 21.7 % — ABNORMAL LOW (ref 39.0–52.0)
Hemoglobin: 7.1 g/dL — ABNORMAL LOW (ref 13.0–17.0)
LYMPHS ABS: 0.4 10*3/uL — AB (ref 0.7–4.0)
Lymphocytes Relative: 7 %
MCH: 29.2 pg (ref 26.0–34.0)
MCHC: 32.7 g/dL (ref 30.0–36.0)
MCV: 89.3 fL (ref 78.0–100.0)
MONO ABS: 0.5 10*3/uL (ref 0.1–1.0)
Monocytes Relative: 9 %
NEUTROS ABS: 4.8 10*3/uL (ref 1.7–7.7)
Neutrophils Relative %: 84 %
PLATELETS: 421 10*3/uL — AB (ref 150–400)
RBC: 2.43 MIL/uL — ABNORMAL LOW (ref 4.22–5.81)
RDW: 23.2 % — AB (ref 11.5–15.5)
WBC: 5.7 10*3/uL (ref 4.0–10.5)

## 2015-07-17 LAB — URINALYSIS, ROUTINE W REFLEX MICROSCOPIC
Bilirubin Urine: NEGATIVE
GLUCOSE, UA: NEGATIVE mg/dL
HGB URINE DIPSTICK: NEGATIVE
KETONES UR: NEGATIVE mg/dL
Leukocytes, UA: NEGATIVE
Nitrite: NEGATIVE
PH: 5 (ref 5.0–8.0)
PROTEIN: NEGATIVE mg/dL
Specific Gravity, Urine: 1.021 (ref 1.005–1.030)

## 2015-07-17 LAB — PROTIME-INR
INR: 5.81 — AB (ref 0.00–1.49)
INR: 1.24 (ref 0.00–1.49)
PROTHROMBIN TIME: 15.7 s — AB (ref 11.6–15.2)
Prothrombin Time: 50.3 seconds — ABNORMAL HIGH (ref 11.6–15.2)

## 2015-07-17 LAB — CK: Total CK: 475 U/L — ABNORMAL HIGH (ref 49–397)

## 2015-07-17 LAB — I-STAT TROPONIN, ED: Troponin i, poc: 0.03 ng/mL (ref 0.00–0.08)

## 2015-07-17 LAB — ECHOCARDIOGRAM COMPLETE
HEIGHTINCHES: 72 in
WEIGHTICAEL: 2688 [oz_av]

## 2015-07-17 LAB — APTT
APTT: 48 s — AB (ref 24–37)
APTT: 83 s — AB (ref 24–37)

## 2015-07-17 LAB — GLUCOSE, CAPILLARY
GLUCOSE-CAPILLARY: 126 mg/dL — AB (ref 65–99)
Glucose-Capillary: 110 mg/dL — ABNORMAL HIGH (ref 65–99)

## 2015-07-17 LAB — VITAMIN B12: Vitamin B-12: 166 pg/mL — ABNORMAL LOW (ref 180–914)

## 2015-07-17 LAB — I-STAT CHEM 8, ED
BUN: 90 mg/dL — ABNORMAL HIGH (ref 6–20)
CALCIUM ION: 1.05 mmol/L — AB (ref 1.13–1.30)
CHLORIDE: 96 mmol/L — AB (ref 101–111)
Creatinine, Ser: 2.5 mg/dL — ABNORMAL HIGH (ref 0.61–1.24)
Glucose, Bld: 207 mg/dL — ABNORMAL HIGH (ref 65–99)
HCT: 25 % — ABNORMAL LOW (ref 39.0–52.0)
HEMOGLOBIN: 8.5 g/dL — AB (ref 13.0–17.0)
POTASSIUM: 4.9 mmol/L (ref 3.5–5.1)
SODIUM: 129 mmol/L — AB (ref 135–145)
TCO2: 19 mmol/L (ref 0–100)

## 2015-07-17 LAB — TROPONIN I
TROPONIN I: 0.1 ng/mL — AB (ref <0.031)
Troponin I: 0.06 ng/mL — ABNORMAL HIGH (ref <0.031)

## 2015-07-17 LAB — MAGNESIUM: Magnesium: 2.1 mg/dL (ref 1.7–2.4)

## 2015-07-17 LAB — PROCALCITONIN: PROCALCITONIN: 0.38 ng/mL

## 2015-07-17 LAB — CBG MONITORING, ED: Glucose-Capillary: 138 mg/dL — ABNORMAL HIGH (ref 65–99)

## 2015-07-17 LAB — LACTIC ACID, PLASMA
LACTIC ACID, VENOUS: 2 mmol/L (ref 0.5–2.0)
Lactic Acid, Venous: 1.4 mmol/L (ref 0.5–2.0)

## 2015-07-17 LAB — RETICULOCYTES
RBC.: 2.92 MIL/uL — AB (ref 4.22–5.81)
RETIC COUNT ABSOLUTE: 46.7 10*3/uL (ref 19.0–186.0)
Retic Ct Pct: 1.6 % (ref 0.4–3.1)

## 2015-07-17 LAB — TSH: TSH: 0.628 u[IU]/mL (ref 0.350–4.500)

## 2015-07-17 LAB — MRSA PCR SCREENING: MRSA BY PCR: NEGATIVE

## 2015-07-17 LAB — FOLATE: Folate: 15.1 ng/mL (ref >5.9)

## 2015-07-17 LAB — FERRITIN: Ferritin: 21 ng/mL — ABNORMAL LOW (ref 24–336)

## 2015-07-17 LAB — I-STAT CG4 LACTIC ACID, ED: Lactic Acid, Venous: 1.35 mmol/L (ref 0.5–2.0)

## 2015-07-17 LAB — HEPARIN LEVEL (UNFRACTIONATED): Heparin Unfractionated: 0.56 IU/mL (ref 0.30–0.70)

## 2015-07-17 MED ORDER — SODIUM CHLORIDE 0.9 % IV SOLN
80.0000 mg | Freq: Once | INTRAVENOUS | Status: AC
  Administered 2015-07-17: 80 mg via INTRAVENOUS
  Filled 2015-07-17: qty 80

## 2015-07-17 MED ORDER — FERROUS SULFATE 325 (65 FE) MG PO TABS
325.0000 mg | ORAL_TABLET | Freq: Two times a day (BID) | ORAL | Status: DC
  Administered 2015-07-18 – 2015-07-24 (×10): 325 mg via ORAL
  Filled 2015-07-17 (×11): qty 1

## 2015-07-17 MED ORDER — LACTATED RINGERS IV BOLUS (SEPSIS)
1000.0000 mL | Freq: Once | INTRAVENOUS | Status: AC
  Administered 2015-07-17: 1000 mL via INTRAVENOUS

## 2015-07-17 MED ORDER — IOPAMIDOL (ISOVUE-370) INJECTION 76%
INTRAVENOUS | Status: AC
  Administered 2015-07-17: 75 mL
  Filled 2015-07-17: qty 100

## 2015-07-17 MED ORDER — SODIUM CHLORIDE 0.9 % IV BOLUS (SEPSIS)
1000.0000 mL | Freq: Once | INTRAVENOUS | Status: AC
  Administered 2015-07-17: 1000 mL via INTRAVENOUS

## 2015-07-17 MED ORDER — INSULIN ASPART 100 UNIT/ML ~~LOC~~ SOLN
0.0000 [IU] | SUBCUTANEOUS | Status: DC
  Administered 2015-07-17 – 2015-07-18 (×3): 1 [IU] via SUBCUTANEOUS
  Administered 2015-07-19: 0 [IU] via SUBCUTANEOUS
  Administered 2015-07-19 – 2015-07-20 (×7): 1 [IU] via SUBCUTANEOUS
  Administered 2015-07-20 (×2): 2 [IU] via SUBCUTANEOUS
  Administered 2015-07-21 – 2015-07-25 (×14): 1 [IU] via SUBCUTANEOUS
  Filled 2015-07-17: qty 1

## 2015-07-17 MED ORDER — PIPERACILLIN-TAZOBACTAM 3.375 G IVPB
3.3750 g | Freq: Three times a day (TID) | INTRAVENOUS | Status: DC
  Administered 2015-07-17 – 2015-07-19 (×5): 3.375 g via INTRAVENOUS
  Filled 2015-07-17 (×8): qty 50

## 2015-07-17 MED ORDER — SODIUM CHLORIDE 0.9 % IV BOLUS (SEPSIS): 1000.0000 mL | Freq: Once | INTRAVENOUS | Status: DC

## 2015-07-17 MED ORDER — SODIUM CHLORIDE 0.9 % IV SOLN
INTRAVENOUS | Status: DC
  Administered 2015-07-17 (×2): via INTRAVENOUS
  Administered 2015-07-18 (×2): 150 mL/h via INTRAVENOUS
  Administered 2015-07-20 – 2015-07-25 (×5): via INTRAVENOUS

## 2015-07-17 MED ORDER — ACETAMINOPHEN 650 MG RE SUPP: 650.0000 mg | Freq: Four times a day (QID) | RECTAL | Status: DC | PRN

## 2015-07-17 MED ORDER — PHENYLEPHRINE HCL 10 MG/ML IJ SOLN
0.0000 ug/min | INTRAMUSCULAR | Status: DC
  Administered 2015-07-17: 30 ug/min via INTRAVENOUS
  Administered 2015-07-18: 40 ug/min via INTRAVENOUS
  Filled 2015-07-17 (×3): qty 4

## 2015-07-17 MED ORDER — PIPERACILLIN-TAZOBACTAM 3.375 G IVPB 30 MIN
3.3750 g | Freq: Once | INTRAVENOUS | Status: AC
  Administered 2015-07-17: 3.375 g via INTRAVENOUS

## 2015-07-17 MED ORDER — HEPARIN BOLUS VIA INFUSION
5000.0000 [IU] | Freq: Once | INTRAVENOUS | Status: DC
  Filled 2015-07-17: qty 5000

## 2015-07-17 MED ORDER — SODIUM CHLORIDE 0.9% FLUSH
3.0000 mL | Freq: Two times a day (BID) | INTRAVENOUS | Status: DC
  Administered 2015-07-17 – 2015-07-26 (×16): 3 mL via INTRAVENOUS

## 2015-07-17 MED ORDER — VANCOMYCIN HCL IN DEXTROSE 750-5 MG/150ML-% IV SOLN
750.0000 mg | INTRAVENOUS | Status: DC
  Filled 2015-07-17: qty 150

## 2015-07-17 MED ORDER — HEPARIN BOLUS VIA INFUSION
3000.0000 [IU] | Freq: Once | INTRAVENOUS | Status: AC
  Administered 2015-07-17: 3000 [IU] via INTRAVENOUS
  Filled 2015-07-17: qty 3000

## 2015-07-17 MED ORDER — ACETAMINOPHEN 325 MG PO TABS
650.0000 mg | ORAL_TABLET | Freq: Four times a day (QID) | ORAL | Status: DC | PRN
  Administered 2015-07-22 – 2015-07-25 (×3): 650 mg via ORAL
  Filled 2015-07-17 (×3): qty 2

## 2015-07-17 MED ORDER — PANTOPRAZOLE SODIUM 40 MG IV SOLR: 40.0000 mg | Freq: Once | INTRAVENOUS | Status: DC

## 2015-07-17 MED ORDER — HEPARIN (PORCINE) IN NACL 100-0.45 UNIT/ML-% IJ SOLN
1200.0000 [IU]/h | INTRAMUSCULAR | Status: DC
  Administered 2015-07-17 (×2): 1300 [IU]/h via INTRAVENOUS
  Filled 2015-07-17 (×2): qty 250

## 2015-07-17 MED ORDER — CETYLPYRIDINIUM CHLORIDE 0.05 % MT LIQD
7.0000 mL | Freq: Two times a day (BID) | OROMUCOSAL | Status: DC
  Administered 2015-07-18 – 2015-07-26 (×16): 7 mL via OROMUCOSAL

## 2015-07-17 MED ORDER — VANCOMYCIN HCL 10 G IV SOLR
1500.0000 mg | Freq: Once | INTRAVENOUS | Status: AC
  Administered 2015-07-17: 1500 mg via INTRAVENOUS
  Filled 2015-07-17: qty 1500

## 2015-07-17 MED ORDER — SODIUM CHLORIDE 0.9 % IV SOLN: Freq: Once | INTRAVENOUS | Status: AC

## 2015-07-17 MED ORDER — FOLIC ACID 5 MG/ML IJ SOLN
1.0000 mg | Freq: Every day | INTRAMUSCULAR | Status: DC
  Administered 2015-07-18 – 2015-07-23 (×6): 1 mg via INTRAVENOUS
  Filled 2015-07-17 (×7): qty 0.2

## 2015-07-17 MED ORDER — SODIUM CHLORIDE 0.9 % IV SOLN
10.0000 mL/h | Freq: Once | INTRAVENOUS | Status: AC
  Administered 2015-07-17: 10 mL/h via INTRAVENOUS

## 2015-07-17 MED ORDER — CHLORHEXIDINE GLUCONATE 0.12 % MT SOLN
15.0000 mL | Freq: Two times a day (BID) | OROMUCOSAL | Status: DC
  Administered 2015-07-17 – 2015-07-26 (×15): 15 mL via OROMUCOSAL
  Filled 2015-07-17 (×8): qty 15

## 2015-07-17 MED ORDER — PHENYLEPHRINE HCL 10 MG/ML IJ SOLN
0.0000 ug/min | INTRAMUSCULAR | Status: DC
  Administered 2015-07-17: 20 ug/min via INTRAVENOUS
  Filled 2015-07-17: qty 1

## 2015-07-17 MED ORDER — VITAMIN B-1 100 MG PO TABS
100.0000 mg | ORAL_TABLET | Freq: Every day | ORAL | Status: DC
  Administered 2015-07-18 – 2015-07-24 (×5): 100 mg via ORAL
  Filled 2015-07-17 (×6): qty 1

## 2015-07-17 NOTE — ED Notes (Signed)
Attempted report to 2S.

## 2015-07-17 NOTE — ED Notes (Signed)
Repaged admitting to [facility], [place].

## 2015-07-17 NOTE — ED Notes (Signed)
Paged admitting again for Patrick [name], [place].

## 2015-07-17 NOTE — ED Notes (Signed)
Paged PCCM to [place], [place]

## 2015-07-17 NOTE — ED Notes (Signed)
Manual BP being obtained.

## 2015-07-17 NOTE — ED Notes (Signed)
Per [facility]: pt complaining of shortness of breath, he woke up feeling like this. Oxygen via nasal cannula seemed to help pt with comfort. Lung sounds clear. Pt A fib on monitor.

## 2015-07-17 NOTE — ED Notes (Signed)
Patient transported to CT 

## 2015-07-17 NOTE — ED Notes (Signed)
Pt remains monitored by blood pressure, pulse ox, and 12 lead. Admitting at bedside.

## 2015-07-17 NOTE — ED Notes (Signed)
Pt returned to room from CT

## 2015-07-17 NOTE — ED Notes (Signed)
CCM notified of critical hgb.

## 2015-07-17 NOTE — Consult Note (Signed)
PULMONARY / CRITICAL CARE MEDICINE   Name: Patrick [name]. 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  In ER pt was hypotensive requiring pressors, down trending hgb, echo revealed evidence of RV strain and CTA chest revealed bilat PE and PCCM consulted.   C/o 2-3 days sob which woke him abruptly at 4am this am.  No other c/o.  Adamantly denies chest pain, hemoptysis, fevers, chills, cough, tarry stools, abd pain, n/d/v, leg/calf pain, syncope or lightheadedness.   Last radiation was last year.    PAST MEDICAL HISTORY :  He  has a past medical history of High blood pressure; Dyslipidemia; Persistent atrial fibrillation ([place]) (01/14/2009); TxN1 adenocarcinoma of the gastric antrum (11/19/2013); Type II diabetes mellitus ([place]); Anemia; History of blood transfusion (12/2014; 01/12/2015); Upper GI bleed (12/2014; 01/12/2015); and Stroke [facility]) (2009).  PAST SURGICAL HISTORY: He  has past surgical history that includes Cardiac catheterization (01/15/2009); doppler echocardiography (10 /2012); Cataract extraction w/ intraocular lens  implant, bilateral (2014); Biopsy stomach (10/21/13); Inguinal hernia repair (Bilateral); and Esophagogastroduodenoscopy (N/A, 01/14/2015).  No Known Allergies  No current facility-administered medications on file prior to encounter.   Current Outpatient Prescriptions on File Prior to Encounter  Medication Sig  . aspirin 81 MG EC tablet Take 81 mg by mouth at bedtime.   Marland Kitchen atorvastatin (LIPITOR) 10 MG tablet Take 10 mg by mouth daily.    . capecitabine (XELODA) 500 MG tablet  Take 2 tablets by mouth twice daily after meals for 2 weeks on, then one week off.  . carvedilol (COREG) 3.125 MG tablet Take 1 tablet (3.125 mg total) by mouth 2 (two) times daily with a meal.  . DILT-XR 180 MG 24 hr capsule Take 1 capsule (180 mg total) by mouth daily.  . ferrous sulfate 325 (65 FE) MG tablet Take 325 mg by mouth 2 (two) times daily with a meal.   . furosemide (LASIX) 20 MG tablet Take 1 tablet by mouth daily.  Marland Kitchen glipiZIDE (GLUCOTROL XL) 5 MG 24 hr tablet Take 5 mg by mouth 2 (two) times daily.  Marland Kitchen lisinopril-hydrochlorothiazide (PRINZIDE,ZESTORETIC) 20-12.5 MG tablet Take 1 tablet by mouth daily.   Marland Kitchen loperamide (IMODIUM A-D) 2 MG tablet Take 2 mg by mouth 4 (four) times daily as needed for diarrhea or loose stools.  . metFORMIN (GLUCOPHAGE) 1000 MG tablet Take 1,000 mg by mouth 2 (two) times daily with a meal.   . pantoprazole (PROTONIX) 40 MG tablet Take 1 tablet (40 mg total) by mouth daily.    FAMILY HISTORY:  His indicated that his mother is deceased. He indicated that his father is deceased.   SOCIAL HISTORY: He  reports that he quit smoking about 6 years ago. His smoking use included Cigarettes. He has a 45 pack-year smoking history. He has never used smokeless tobacco. He reports that he drinks alcohol. He reports that he does not use illicit drugs.  REVIEW OF SYSTEMS:   As per HPI - All other systems reviewed and were neg.    SUBJECTIVE:    VITAL SIGNS: BP 80/59 mmHg  Pulse 115  Temp(Src)  97.7 F (36.5 C) (Oral)  Resp 25  Ht 6' (1.829 m)  Wt 76.204 kg (168 lb)  BMI 22.78 kg/m2  SpO2 81%  HEMODYNAMICS:    VENTILATOR SETTINGS:    INTAKE / OUTPUT: I/O last 3 completed shifts: In: 4000 [I.V.:4000] Out: 480 [Urine:480]  PHYSICAL EXAMINATION: General:  Pleasant, frail, chronically ill appearing male, NAD  Neuro:  Awake, alert, appropriate, MAE, gen weakness  HEENT:  Mm dry, no JVD  Cardiovascular:  s1s2 irreg  Lungs:  resps even non labored on  [place], clear  Abdomen:  Round, mildly distended, mildly tender, hypoactive bs   Musculoskeletal:  Warm and dry, scant BLE edema, symmetric BLE    LABS:  BMET  Recent Labs Lab 07/17/15 0500 07/17/15 0518  NA 129* 129*  K 4.9 4.9  CL 95* 96*  CO2 17*  --   BUN 97* 90*  CREATININE 2.47* 2.50*  GLUCOSE 213* 207*    Electrolytes  Recent Labs Lab 07/17/15 0500  CALCIUM 8.7*    CBC  Recent Labs Lab 07/17/15 0500 07/17/15 0518 07/17/15 1034  WBC 5.7  --  5.0  HGB 7.1* 8.5* 6.1*  HCT 21.7* 25.0* 18.2*  PLT 421*  --  364    Coag's  Recent Labs Lab 07/17/15 0500 07/17/15 0723 07/17/15 1034  APTT 48* 83*  --   INR 5.81*  --  1.24    Sepsis Markers  Recent Labs Lab 07/17/15 0723 07/17/15 0735 07/17/15 1034  LATICACIDVEN 1.4 1.35 2.0    ABG  Recent Labs Lab 07/17/15 0603 07/17/15 1116  PHART 7.323* 7.342*  PCO2ART 28.4* 27.7*  PO2ART 86.0 81.0    Liver Enzymes  Recent Labs Lab 07/17/15 0500  AST 25  ALT 13*  ALKPHOS 57  BILITOT 0.7  ALBUMIN 3.2*    Cardiac Enzymes No results for input(s): TROPONINI, PROBNP in the last 168 hours.  Glucose  Recent Labs Lab 07/17/15 0821  GLUCAP 138*    Imaging Ct Angio Chest Pe W/cm &/or Wo Cm  07/17/2015  CLINICAL DATA:  Shortness of breath and abdominal pain after train ride. Evaluate for pulmonary embolus. EXAM: CT ANGIOGRAPHY CHEST WITH CONTRAST TECHNIQUE: Multidetector CT imaging of the chest was performed using the standard protocol during bolus administration of intravenous contrast. Multiplanar CT image reconstructions and MIPs were obtained to evaluate the vascular anatomy. CONTRAST:  75 cc of Isovue 370 COMPARISON:  PET-CT March 31, 2015 FINDINGS: Bilateral pulmonary emboli are seen involving the bilateral upper, right middle, and bilateral lower lobes. The pulmonary embolus burden is moderate. The thoracic aorta is not well assessed due to timing of contrast but no aneurysm or dissection is  seen. Coronary artery calcifications are identified. The esophagus is fluid filled. There is a lymph node in the epicardial fat anterior to the liver on series 4, image 99 which measures 2 cm in greatest dimension versus 10 mm previously. This is much larger in the interval. The subcarinal node may be mildly more prominent. No other adenopathy seen in the chest. There is a tiny right pleural effusion. No left pleural or pericardial effusion. Cardiomegaly persists. The central airways are stable. No pneumothorax. No new pulmonary nodules, masses, or infiltrates are identified. The stomach is distended with fluid and air. Marked thickening of the gastric antrum and distal body is again identified. Adenopathy again seen anterior to the gastric antrum. No other acute abnormalities are seen within the abdomen. There is a lymph node just medial to the stomach on  image 117 which is new in the interval. No bony metastatic disease is seen. Review of the MIP images confirms the above findings. IMPRESSION: 1. Bilateral pulmonary emboli identified with a moderate burden. 2. There is an enlarging node in the epicardial fat anterior to the liver. Findings of known gastric cancer seen in the distal gastric body and antrum with worsening nodes in the upper abdomen. 3. Gastric distention suggesting at least partial gastric outlet obstruction. Findings called to Dr. Lita Mains Electronically Signed   By: Dorise Bullion III M.D   On: 07/17/2015 08:07   Dg Chest Port 1 View  07/17/2015  CLINICAL DATA:  Dyspnea today EXAM: PORTABLE CHEST 1 VIEW COMPARISON:  12/20/2014 FINDINGS: A single AP portable view of the chest demonstrates no focal airspace consolidation or alveolar edema. The lungs are grossly clear. There is no large effusion or pneumothorax. Cardiac and mediastinal contours appear unremarkable. IMPRESSION: No active disease. Electronically Signed   By: Andreas Newport M.D.   On: 07/17/2015 05:39     STUDIES:  CTA  chest 5/13>>> 1. Bilateral pulmonary emboli identified with a moderate burden. 2. There is an enlarging node in the epicardial fat anterior to the liver. Findings of known gastric cancer seen in the distal gastric body and antrum with worsening nodes in the upper abdomen. 3. Gastric distention suggesting at least partial gastric outlet obstruction  CULTURES: BCx2 5/13>>> Urine 5/13>>>   ANTIBIOTICS: vanc 5/13>>> Zosyn 5/13>>>  SIGNIFICANT EVENTS:   LINES/TUBES:   DISCUSSION: 70yo male with invasive adenocarcinoma of the stomach s/p xrt on current oral chemo and chronic anemia presents 5/13 with dyspnea and shock.  Found to have bilat PE.    ASSESSMENT / PLAN:  PULMONARY Dyspnea  PE     P:   See below  Supplemental O2  pulm hygiene  F/u CXR   CARDIOVASCULAR Hypotension/shock -- suspect cardiogenic in setting PE, however only mild hypoxia, no tachycardia.  Shock seems out of proportion to PE alone.  Cannot r/o sepsis on current chemo although no clear source.  ?active bleeding.   PE -- complicated by chronic anemia.  NOT on anticoagulation for AFib r/t high risk of bleeding.   AFib  P:  BLE venous dopplers  Stat echo  Heparin gtt per pharmacy  Doubt candidate for EKOS or systemic thrombolysis r/t bleeding risk Pressors - titrate as needed to maintain MAP >65 Empiric abx as below   Hold home B blocker  Trend troponin   RENAL Mild metabolic acidosis  AKI -- multifactorial r/t hypotension c/b metformin, diuretics, ace-i at home  Hyponatremia  P:   F/u chem  Volume resuscitation  Trend lactate  Hold metformin, lasix, lisinopril   GASTROINTESTINAL Invasive gastric adenocarcinoma  Anemia  ?gastric outlet obstruction  P:   NPO  PPI  Consider GI input if worsening anemia   HEMATOLOGIC Acute on chronic anemia - no s/s active bleeding.  C/b PE and need for anticoagulation.  P:  PRBC x 2  F/u CBC  Monitor closely on heparin gtt for PE  Not candidate for  thrombolytics  Anemia panel pending   INFECTIOUS Shock - ??sepsis although no clear source infection. On oral palliative chemo  P:   Pan culture  Empiric broad spectrum abx  Check pro calcitonin   ENDOCRINE DM   P:   SSI   NEUROLOGIC No active issue  P:   Supportive care    FAMILY  - Updates:  Pt updated at length 5/13 in ER.  Discussed code status -- pt seems as though he wants to continue aggressive medical care but would not want intubation, CPR/defib.  However he is not ready to definitively make that decision.  Will continue goals of care discussions.     - Inter-disciplinary family meet or Palliative Care meeting due by:  day [place], NP 07/17/2015  11:41 AM Pager: [phone] or [phone]

## 2015-07-17 NOTE — Procedures (Signed)
Arterial Catheter Insertion Procedure Note Patrick [name] [id number] 15-May-1945  Procedure: Insertion of Arterial Catheter  Indications: Blood pressure monitoring  Procedure Details Consent: Risks of procedure as well as the alternatives and risks of each were explained to the (patient/caregiver).  Consent for procedure obtained. Time Out: Verified patient identification, verified procedure, site/side was marked, verified correct patient position, special equipment/implants available, medications/allergies/relevent history reviewed, required imaging and test results available.  Performed  Maximum sterile technique was used including antiseptics, cap, gloves, gown, hand hygiene, mask and sheet. Skin prep: Chlorhexidine; local anesthetic administered 20 gauge catheter was inserted into right radial artery using the Seldinger technique.  Evaluation Blood flow good; BP tracing good. Complications: No apparent complications.   Mcneil Sober 07/17/2015

## 2015-07-17 NOTE — Progress Notes (Signed)
Dr. Laneta Simmers and Dr. Alroy Dust approved the use of Isovue 370 for CT angio Chest to R/O PE.  Patient was given a reduce dose of 75 cc.

## 2015-07-17 NOTE — ED Notes (Signed)
Respiratory at bedside to insert arterial line.

## 2015-07-17 NOTE — Progress Notes (Signed)
Pharmacy Antibiotic Note  Patrick Solow. is a 70 y.o. male admitted on 07/17/2015 with dyspnea.  Pharmacy has been consulted for Zosyn and vancomycin dosing.  Day #1 of abx for possible sepsis. Afebrile, WBC wnl. CXR results show no active disease. SCr elevated at 1.99, CrCl ~33ml/min.  Plan: Give Zosyyn 3.375g IV x 1 (30 min infusion), then start Zosyn 3.375 gm IV q8h (4 hour infusion) Give vancomycin 1.5g IV x 1, then start vancomycin 750mg  IV Q24 Monitor clinical picture, renal function, VT prn F/U C&S, abx deescalation / LOT  Sepsis picture most likely due to PE. Consider need to continue abx?  Height: 6' (182.9 cm) Weight: 168 lb (76.204 kg) IBW/kg (Calculated) : 77.6  Temp (24hrs), Avg:97.5 F (36.4 C), Min:97.2 F (36.2 C), Max:97.7 F (36.5 C)   Recent Labs Lab 07/17/15 0500 07/17/15 0518 07/17/15 0723 07/17/15 0735 07/17/15 1034  WBC 5.7  --   --   --  5.0  CREATININE 2.47* 2.50*  --   --  1.99*  LATICACIDVEN  --   --  1.4 1.35 2.0    Estimated Creatinine Clearance: 36.7 mL/min (by C-G formula based on Cr of 1.99).    No Known Allergies  Antimicrobials this admission: Zosyn 5/13 >>  Vancomycin 5/13 >>   Dose adjustments this admission: n/a  Microbiology results: 5/13 BCx: sent 5/13 UCx: sent   Thank you for allowing pharmacy to be a part of this patient's care.  Elenor Quinones, PharmD, BCPS Clinical Pharmacist Pager [phone] 07/17/2015 12:18 PM

## 2015-07-17 NOTE — ED Notes (Signed)
No chest pain, no discomfort, stated very mild nausea. Pt does have hiccups.

## 2015-07-17 NOTE — Progress Notes (Signed)
  Echocardiogram 2D Echocardiogram has been performed.  Jennette Dubin 07/17/2015, 9:43 AM

## 2015-07-17 NOTE — ED Notes (Signed)
Attempted report to [facility]. 2C is a step down unit and pt is in need of an ICU bed.

## 2015-07-17 NOTE — ED Notes (Signed)
Pt remains monitored by blood pressure, pulse ox, and 12 lead. Assisted pt with use of urinal. Pt had 275cc of urine output.

## 2015-07-17 NOTE — ED Notes (Signed)
Pts blood pressure noted to be 64/43 notified to Elmyra Ricks, RN and Dr. Lita Mains.

## 2015-07-17 NOTE — Progress Notes (Signed)
ANTICOAGULATION CONSULT NOTE - FOLLOW UP    HL = 0.56 (goal 0.3 - 0.7 units/mL) Heparin dosing weight = 84 kg   Assessment: 69 YOM to continue on IV heparin for new, bilateral PE with moderate burden and left DVT.  Heparin level is therapeutic; no bleeding reported.   Plan: - Continue heparin gtt at 1300 units/hr - Check confirmatory HL    Tip Atienza D. Mina Marble, PharmD, BCPS 07/17/2015, 5:39 PM

## 2015-07-17 NOTE — ED Provider Notes (Signed)
CSN: [id number]     Arrival date & time 07/17/15  0447 History   First MD Initiated Contact with Patient 07/17/15 [phone]     Chief Complaint  Patient presents with  . Shortness of Breath     (Consider location/radiation/quality/duration/timing/severity/associated sxs/prior Treatment) Patient is a 70 y.o. male presenting with shortness of breath. The history is provided by the patient.  Shortness of Breath Severity:  Severe Onset quality:  Sudden Duration:  6 hours Timing:  Constant Progression:  Worsening Chronicity:  New Context comment:  At rest Relieved by:  Nothing Worsened by:  Nothing tried Ineffective treatments:  None tried Associated symptoms: no abdominal pain, no chest pain, no cough, no fever, no hemoptysis, no sputum production, no syncope, no vomiting and no wheezing   Risk factors: hx of cancer   Risk factors: no hx of PE/DVT     Past Medical History  Diagnosis Date  . High blood pressure   . Dyslipidemia   . Persistent atrial fibrillation ([place]) 01/14/2009    afib--cardioversion successful x1 shock; recurrent A. fib  . 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 Type II diabetes mellitus ([place])   . Anemia   . History of blood transfusion 12/2014; 01/12/2015    "related to low HgB; related to low HgB" (01/12/2015)  . Upper GI bleed 12/2014; 01/12/2015  . Stroke [facility]) 2009    denies residual on 01/12/2015   Past Surgical History  Procedure Laterality Date  . Cardiac catheterization  01/15/2009    LV 40%, no occlusive coronary disease,norenal artery stenosis or abdmoninal aotric aneurysm  . Doppler echocardiography  10 /2012    EF normal -probably elevated left atrial pressures unable to asses due to afib.;mildly scelrotic aortic valve but  no stenosis and mildlyelevated right atrial  pressures and pulm. pressures at 45-50 mmhg with mild to mod pulm  htn no change from 2011  . Cataract extraction w/ intraocular lens  implant, bilateral  2014  . Biopsy  stomach  10/21/13    [id number]  . Inguinal hernia repair Bilateral   . Esophagogastroduodenoscopy N/A 01/14/2015    Procedure: ESOPHAGOGASTRODUODENOSCOPY (EGD);  Surgeon: Carol Ada, MD;  Location: [facility] ENDOSCOPY;  Service: Endoscopy;  Laterality: N/A;   No family history on file. Social History  Substance Use Topics  . Smoking status: Former Smoker -- 1.00 packs/day for 45 years    Types: Cigarettes    Quit date: 11/21/2008  . Smokeless tobacco: Never Used  . Alcohol Use: Yes     Comment: 01/12/2015 "quit in 2009"    Review of Systems  Constitutional: Negative for fever.  Respiratory: Positive for shortness of breath. Negative for cough, hemoptysis, sputum production and wheezing.   Cardiovascular: Negative for chest pain and syncope.  Gastrointestinal: Negative for vomiting and abdominal pain.  All other systems reviewed and are negative.     Allergies  Review of patient's allergies indicates no known allergies.  Home Medications   Prior to Admission medications   Medication Sig Start Date End Date Taking? Authorizing Provider  aspirin 81 MG EC tablet Take 81 mg by mouth at bedtime.     Historical Provider, MD  atorvastatin (LIPITOR) 10 MG tablet Take 10 mg by mouth daily.      Historical Provider, MD  capecitabine (XELODA) 500 MG tablet Take 2 tablets by mouth twice daily after meals for 2 weeks on, then one week off. 07/02/15   Wyatt Portela, MD  carvedilol (COREG) 3.125 MG tablet  Take 1 tablet (3.125 mg total) by mouth 2 (two) times daily with a meal. 12/24/14   Allie Bossier, MD  DILT-XR 180 MG 24 hr capsule Take 1 capsule (180 mg total) by mouth daily. 06/09/15   Leonie Man, MD  ferrous sulfate 325 (65 FE) MG tablet Take 325 mg by mouth 2 (two) times daily with a meal.  09/26/14   Historical Provider, MD  furosemide (LASIX) 20 MG tablet Take 1 tablet by mouth daily. 01/26/15   Historical Provider, MD  glipiZIDE (GLUCOTROL XL) 5 MG 24 hr tablet Take 5 mg by mouth 2  (two) times daily.    Historical Provider, MD  lisinopril-hydrochlorothiazide (PRINZIDE,ZESTORETIC) 20-12.5 MG tablet Take 2 tablets by mouth daily. 11/02/14   Historical Provider, MD  loperamide (IMODIUM A-D) 2 MG tablet Take 2 mg by mouth 4 (four) times daily as needed for diarrhea or loose stools.    Historical Provider, MD  metFORMIN (GLUCOPHAGE) 1000 MG tablet Take 1,000 mg by mouth 2 (two) times daily with a meal.  08/19/14   Historical Provider, MD  pantoprazole (PROTONIX) 40 MG tablet Take 1 tablet (40 mg total) by mouth daily. 01/15/15   Shanker Kristeen Mans, MD   BP 75/46 mmHg  Pulse 73  Temp(Src) 97.4 F (36.3 C) (Oral)  Resp 19  Ht 6' (1.829 m)  Wt 168 lb (76.204 kg)  BMI 22.78 kg/m2  SpO2 100% Physical Exam  Constitutional: He is oriented to person, place, and time. He appears well-developed. He has a sickly appearance. No distress.  HENT:  Head: Normocephalic and atraumatic.  Eyes: Conjunctivae are normal.  Neck: Neck supple. No tracheal deviation present.  Cardiovascular: S1 normal and S2 normal.  An irregularly irregular rhythm present. Tachycardia present.   Pulmonary/Chest: Effort normal. No respiratory distress. He has no wheezes. He has no rales.  Abdominal: Soft. He exhibits no distension. There is tenderness (epigasrtic). There is no rebound.  Neurological: He is alert and oriented to person, place, and time.  Skin: Skin is warm and dry.  Psychiatric: He has a normal mood and affect.  Vitals reviewed.   ED Course  Procedures (including critical care time)  CRITICAL CARE Performed by: Leo Grosser Total critical care time: 30 minutes Critical care time was exclusive of separately billable procedures and treating other patients. Critical care was necessary to treat or prevent imminent or life-threatening deterioration. Critical care was time spent personally by me on the following activities: development of treatment plan with patient and/or surrogate as well as  nursing, discussions with consultants, evaluation of patient's response to treatment, examination of patient, obtaining history from patient or surrogate, ordering and performing treatments and interventions, ordering and review of laboratory studies, ordering and review of radiographic studies, pulse oximetry and re-evaluation of patient's condition.   Emergency Focused Ultrasound Exam Limited Ultrasound of the Heart and Pericardium  Performed and interpreted by Dr. Laneta Simmers Indication: hypotension, shortness of breath Multiple views of the heart, pericardium, and IVC are obtained with a multi frequency probe.  Findings: nml contractility, no anechoic fluid, complete IVC collapse, bowing of IV septum and RV enlargement Interpretation: nml ejection fraction, no pericardial effusion, depressed CVP, evidence of right heart strain Images archived electronically.  CPT Code: [id number]   Labs Review Labs Reviewed  COMPREHENSIVE METABOLIC PANEL - Abnormal; Notable for the following:    Sodium 129 (*)    Chloride 95 (*)    CO2 17 (*)    Glucose, Bld 213 (*)  BUN 97 (*)    Creatinine, Ser 2.47 (*)    Calcium 8.7 (*)    Albumin 3.2 (*)    ALT 13 (*)    GFR calc non Af Amer 25 (*)    GFR calc Af Amer 29 (*)    Anion gap 17 (*)    All other components within normal limits  CBC WITH DIFFERENTIAL/PLATELET - Abnormal; Notable for the following:    RBC 2.43 (*)    Hemoglobin 7.1 (*)    HCT 21.7 (*)    RDW 23.2 (*)    Platelets 421 (*)    Lymphs Abs 0.4 (*)    All other components within normal limits  URINALYSIS, ROUTINE W REFLEX MICROSCOPIC (NOT AT [facility]) - Abnormal; Notable for the following:    APPearance CLOUDY (*)    All other components within normal limits  BRAIN NATRIURETIC PEPTIDE - Abnormal; Notable for the following:    B Natriuretic Peptide 313.6 (*)    All other components within normal limits  PROTIME-INR - Abnormal; Notable for the following:    Prothrombin Time 50.3 (*)     INR 5.81 (*)    All other components within normal limits  APTT - Abnormal; Notable for the following:    aPTT 48 (*)    All other components within normal limits  CK - Abnormal; Notable for the following:    Total CK 475 (*)    All other components within normal limits  APTT - Abnormal; Notable for the following:    aPTT 83 (*)    All other components within normal limits  I-STAT CHEM 8, ED - Abnormal; Notable for the following:    Sodium 129 (*)    Chloride 96 (*)    BUN 90 (*)    Creatinine, Ser 2.50 (*)    Glucose, Bld 207 (*)    Calcium, Ion 1.05 (*)    Hemoglobin 8.5 (*)    HCT 25.0 (*)    All other components within normal limits  I-STAT ARTERIAL BLOOD GAS, ED - Abnormal; Notable for the following:    pH, Arterial 7.323 (*)    pCO2 arterial 28.4 (*)    Bicarbonate 14.7 (*)    Acid-base deficit 10.0 (*)    All other components within normal limits  CBG MONITORING, ED - Abnormal; Notable for the following:    Glucose-Capillary 138 (*)    All other components within normal limits  URINE CULTURE  CULTURE, BLOOD (ROUTINE X 2)  CULTURE, BLOOD (ROUTINE X 2)  LACTIC ACID, PLASMA  LACTIC ACID, PLASMA  DIC (DISSEMINATED INTRAVASCULAR COAGULATION) PANEL  HEMOGLOBIN A1C  I-STAT CG4 LACTIC ACID, ED  I-STAT TROPOININ, ED  POC OCCULT BLOOD, ED  TYPE AND SCREEN    Imaging Review Ct Angio Chest Pe W/cm &/or Wo Cm  07/17/2015  CLINICAL DATA:  Shortness of breath and abdominal pain after train ride. Evaluate for pulmonary embolus. EXAM: CT ANGIOGRAPHY CHEST WITH CONTRAST TECHNIQUE: Multidetector CT imaging of the chest was performed using the standard protocol during bolus administration of intravenous contrast. Multiplanar CT image reconstructions and MIPs were obtained to evaluate the vascular anatomy. CONTRAST:  75 cc of Isovue 370 COMPARISON:  PET-CT March 31, 2015 FINDINGS: Bilateral pulmonary emboli are seen involving the bilateral upper, right middle, and bilateral lower  lobes. The pulmonary embolus burden is moderate. The thoracic aorta is not well assessed due to timing of contrast but no aneurysm or dissection is seen. Coronary artery calcifications  are identified. The esophagus is fluid filled. There is a lymph node in the epicardial fat anterior to the liver on series 4, image 99 which measures 2 cm in greatest dimension versus 10 mm previously. This is much larger in the interval. The subcarinal node may be mildly more prominent. No other adenopathy seen in the chest. There is a tiny right pleural effusion. No left pleural or pericardial effusion. Cardiomegaly persists. The central airways are stable. No pneumothorax. No new pulmonary nodules, masses, or infiltrates are identified. The stomach is distended with fluid and air. Marked thickening of the gastric antrum and distal body is again identified. Adenopathy again seen anterior to the gastric antrum. No other acute abnormalities are seen within the abdomen. There is a lymph node just medial to the stomach on image 117 which is new in the interval. No bony metastatic disease is seen. Review of the MIP images confirms the above findings. IMPRESSION: 1. Bilateral pulmonary emboli identified with a moderate burden. 2. There is an enlarging node in the epicardial fat anterior to the liver. Findings of known gastric cancer seen in the distal gastric body and antrum with worsening nodes in the upper abdomen. 3. Gastric distention suggesting at least partial gastric outlet obstruction. Findings called to Dr. Lita Mains Electronically Signed   By: Dorise Bullion III M.D   On: 07/17/2015 08:07   Dg Chest Port 1 View  07/17/2015  CLINICAL DATA:  Dyspnea today EXAM: PORTABLE CHEST 1 VIEW COMPARISON:  12/20/2014 FINDINGS: A single AP portable view of the chest demonstrates no focal airspace consolidation or alveolar edema. The lungs are grossly clear. There is no large effusion or pneumothorax. Cardiac and mediastinal contours  appear unremarkable. IMPRESSION: No active disease. Electronically Signed   By: Andreas Newport M.D.   On: 07/17/2015 05:39   I have personally reviewed and evaluated these images and lab results as part of my medical decision-making.   EKG Interpretation   Date/Time:  Saturday Jul 17 2015 05:04:04 EDT Ventricular Rate:  138 PR Interval:    QRS Duration: 90 QT Interval:  351 QTC Calculation: 532 R Axis:   71 Text Interpretation:  Atrial fibrillation Paired ventricular premature  complexes Low voltage, extremity leads Prolonged QT interval  Interpretation limited secondary to artifact Confirmed by Skyla Champagne MD, Shaheed Schmuck  [id number]) on 07/17/2015 5:28:38 AM      MDM   Final diagnoses:  Acute kidney injury ([place])  Shortness of breath  Hypotension, unspecified hypotension type  Elevated INR  Chronic atrial fibrillation ([facility])  Iron deficiency anemia secondary to blood loss (chronic)  Other acute pulmonary embolism with acute cor pulmonale ([place])    70 y.o. male presents with shortness of breath that occurred suddenly this evening that appears to be helped by O2 with EMS. He is tachycardic and hypotensive on arrival. Not febrile, otherwise in his normal state of health prior to this. He has been having hiccups since incident. I am highly suspicious for PE given active chemotherapy for adenocarcinoma of stomach and clinical picture. Bedside echo increases suspicion of large PE burden with evidence of RV strain. heparinized empirically pending rest of workup given high probabliity of clinically significant emboli. Pt is extremely complicated d/t known chronic GI bleeding, acute on chronic anemia, and mixed coagulopathy with highly abnormal INR but likely is hypercoagulable and coagulopathic d/t decreased liver function. LFTs are remarkably normal. BP is responsive to large volume fluid resuscitation with 5 L of crystalloid and IVC now with variable collapse suggesting  a component of decreased preload  from dehydration or bleeding which appears to be correcting.   Considered risk/benefit of contrasted scan given AKI which appears pre-renal and with high likelihood of delayed further heparinization without definitive diagnosis. It was felt risk of contrast nephropathy was outweighed by potential benefits of definitive care and Pt has been adequately volume loaded to proceed. CT with multiple PE and moderate burden confirming clinical suspicion. Hospitalist was consulted for admission and will see the patient in the emergency department.    Leo Grosser, MD 07/17/15 [phone]

## 2015-07-17 NOTE — Progress Notes (Addendum)
ANTICOAGULATION CONSULT NOTE - Initial Consult  Pharmacy Consult for Heparin  Indication: Rule out PE  No Known Allergies  Patient Measurements: Height: 6' (182.9 cm) Weight: 168 lb (76.204 kg) IBW/kg (Calculated) : 77.6  Vital Signs: Temp: 97.4 F (36.3 C) (05/13 0454) Temp Source: Oral (05/13 0454) BP: 75/46 mmHg (05/13 0500) Pulse Rate: 73 (05/13 0454)  Labs:  Recent Labs  07/17/15 0518  HGB 8.5*  HCT 25.0*  CREATININE 2.50*    Estimated Creatinine Clearance: 29.2 mL/min (by C-G formula based on Cr of 2.5).   Medical History: Past Medical History  Diagnosis Date  . High blood pressure   . Dyslipidemia   . Persistent atrial fibrillation ([place]) 01/14/2009    afib--cardioversion successful x1 shock; recurrent A. fib  . 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 Type II diabetes mellitus ([place])   . Anemia   . History of blood transfusion 12/2014; 01/12/2015    "related to low HgB; related to low HgB" (01/12/2015)  . Upper GI bleed 12/2014; 01/12/2015  . Stroke [facility]) 2009    denies residual on 01/12/2015   Assessment: 70 y/o M who woke up with shortness of breath, starting heparin for rule out PE, pt has invasive adenocarcinoma of the stomach as a risk factor, hx of afib, not on anti-coagulation PTA, pt has iron deficiency anemia with a Hgb of 8.5, noted renal dysfunction, other labs reviewed. INR elevated at 5.81, ?error, ?liver injury/hypopefusion.   Goal of Therapy:  Heparin level 0.3-0.7 units/ml Monitor platelets by anticoagulation protocol: Yes   Plan:  -Heparin 3000 units BOLUS (reduced bolus with hx of GIB/stomach CA) -Start heparin drip at 1300 units/hr -1400 HL -Daily CBC/HL -Monitor for bleeding -F/U work-up for PE  Narda Bonds 07/17/2015,5:34 AM

## 2015-07-17 NOTE — ED Notes (Addendum)
Dr. Laneta Simmers aware of pts temperature. No new orders at this time.

## 2015-07-17 NOTE — Significant Event (Signed)
About 30 min after placing NG tube, pts BP continues to drop, going from requiring 13mcg Neo to 151mcg. HR up to the 140s AFib. Patients RR up to the 40s, very labored breathing, and accessory muscle use. Pt more drowsy and lethargic at this time and states he feels like he needs more oxygen. Nasal cannula increased from 2L to 6L. ABG obtained. Ph 7.33, cO2 24, O2 61, Base Excess -12, Bicarb 12, and sO2 90%. Dr. Halford Chessman made aware and order given for BiPAP. Dr. Halford Chessman made aware that patient had vomited about 3 times during the time of placing the NG tube. Dr. Halford Chessman ordered to proceed with BiPAP placement. RT made aware.   Vena Austria

## 2015-07-17 NOTE — ED Notes (Signed)
Pt remains monitored by blood pressure, pulse ox, and 12 lead.  

## 2015-07-17 NOTE — Progress Notes (Signed)
VASCULAR LAB PRELIMINARY  PRELIMINARY  PRELIMINARY  PRELIMINARY  Bilateral lower extremity venous duplex  completed.    Preliminary report:  Right:  No evidence of DVT, superficial thrombosis, or Baker's cyst.   Left:  Superficial thrombosis noted in a perforator vein mid calf.  No evidence of DVT.     Casimer Russett, RVT 07/17/2015, 3:36 PM

## 2015-07-17 NOTE — H&P (Signed)
History and Physical    Patrick [name]. [id number] DOB: 01/08/1946 DOA: 07/17/2015   PCP: Thressa Sheller, MD   Start time: 744 am End time: 1100 am  Patient coming from/Resides with: Private residence and lives with his son  Chief Complaint: Dyspnea  HPI: Patrick [name]. is a 70 y.o. WM PMHx Gastric adenocarcinoma status post radiation therapy on palliative Xeloda, hypertension, diabetes on metformin, iron deficiency anemia with baseline hemoglobin around 9 this is in the setting of ongoing chronic blood loss, chronic atrial fibrillation not an anticoagulation candidate secondary to the above issues. Patient presented to the ER with reports of shortness of breath.  Upon arrival to the ER patient was hypotensive with a systolic blood pressure in the 70s. ER physician was concerned in the setting of malignancy patient may have PE given presenting symptoms with associated dyspnea so a bedside quick look echocardiogram was completed that was concerning for RV strain. Labs also revealed acute kidney injury. EDP discussed with radiologist who recommended a low dye load CT angiogram of the chest to evaluate for PE. Initial lab data revealed an unexplained coagulopathy with an INR greater than 5 without overt signs of bleeding and normal platelet count. Because of this empiric heparin was placed on hold until CT of the chest could be completed.  Upon my evaluation of the patient he denied issues such as nausea vomiting diarrhea and inability to eat, any postprandial bloating or abdominal pain. He reports for the past 2 days he's been slightly more short of breath than usual not definitely correlated with activity. He developed sudden shortness of breath early this morning without chest pain and had not noticed any heart racing. He has not had any blood or dark stools.  ED Course:  Afebrile-initial blood pressure 75/46 -MAP 54-pulse 82-respirations 19-room air saturations at rest 100%;  patient comfort ER staff placed patient on 2 L nasal cannula Repeat vital signs after fluid challenge: BP 94/64 with an MAP of 74-pulse 109-respirations 27 CT angiogram of the chest with limited contrast medium: Portable chest x-ray: Unremarkable Lab data: Sodium 129, chloride 95, CO2 17, BUN 97, creatinine 2.47, glucose 213, anion gap 17, LFTs are normal except for slightly low ALT of 13, BNP 314, troponin 0.03, lactic acid 1.35, WBC 5700 with neutrophils 84% and absolute neutrophils 4.8%, hemoglobin 7.1, platelets 421,000; ABG with mild acidosis pH 7.32 with normal PCO2 of 28.4, PO2 normal at 86 bicarbonate 14.7 and acid base deficit [id number] c/w with metabolic acidemia, urinalysis cloudy without evidence of proteinuria or infection and slightly elevated specific gravity of 1.021, urine culture and blood cultures have been obtained in the ER; repeat coags and DIC panel is pending A total of or liters normal saline bolus has been infused while in the ER Heparin bolus was given 3000 units but infusion placed on hold given coagulopathy until diagnosis of PE confirmed  Review of Systems:  In addition to the HPI above,  No Fever-chills, myalgias or other constitutional symptoms No Headache, changes with Vision or hearing, new weakness, tingling, numbness in any extremity, No problems swallowing food or Liquids, indigestion/reflux No Chest pain, Cough, palpitations, orthopnea  No Abdominal pain, N/V; no melena or hematochezia, no dark tarry stools No dysuria, hematuria or flank pain No new skin rashes, lesions, masses or bruises, No new joints pains-aches No recent weight gain or loss No polyuria, polydypsia or polyphagia,   Past Medical History  Diagnosis Date  . High blood pressure   . Dyslipidemia   .  Persistent atrial fibrillation ([place]) 01/14/2009    afib--cardioversion successful x1 shock; recurrent A. fib  . 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 Type II diabetes mellitus  ([place])   . Anemia   . History of blood transfusion 12/2014; 01/12/2015    "related to low HgB; related to low HgB" (01/12/2015)  . Upper GI bleed 12/2014; 01/12/2015  . Stroke [facility]) 2009    denies residual on 01/12/2015    Past Surgical History  Procedure Laterality Date  . Cardiac catheterization  01/15/2009    LV 40%, no occlusive coronary disease,norenal artery stenosis or abdmoninal aotric aneurysm  . Doppler echocardiography  10 /2012    EF normal -probably elevated left atrial pressures unable to asses due to afib.;mildly scelrotic aortic valve but  no stenosis and mildlyelevated right atrial  pressures and pulm. pressures at 45-50 mmhg with mild to mod pulm  htn no change from 2011  . Cataract extraction w/ intraocular lens  implant, bilateral  2014  . Biopsy stomach  10/21/13    [id number]  . Inguinal hernia repair Bilateral   . Esophagogastroduodenoscopy N/A 01/14/2015    Procedure: ESOPHAGOGASTRODUODENOSCOPY (EGD);  Surgeon: Carol Ada, MD;  Location: [facility] ENDOSCOPY;  Service: Endoscopy;  Laterality: N/A;     reports that he quit smoking about 6 years ago. His smoking use included Cigarettes. He has a 45 pack-year smoking history. He has never used smokeless tobacco. He reports that he drinks alcohol. He reports that he does not use illicit drugs.  Mobility: With a cane when needs to walk long distances Work history: Not obtained   No Known Allergies  Family history reviewed and not pertinent. No known family history of coagulopathic disorders  Prior to Admission medications   Medication Sig Start Date End Date Taking? Authorizing Provider  aspirin 81 MG EC tablet Take 81 mg by mouth at bedtime.    Yes Historical Provider, MD  atorvastatin (LIPITOR) 10 MG tablet Take 10 mg by mouth daily.     Yes Historical Provider, MD  capecitabine (XELODA) 500 MG tablet Take 2 tablets by mouth twice daily after meals for 2 weeks on, then one week off. 07/02/15  Yes Wyatt Portela, MD    carvedilol (COREG) 3.125 MG tablet Take 1 tablet (3.125 mg total) by mouth 2 (two) times daily with a meal. 12/24/14  Yes Allie Bossier, MD  DILT-XR 180 MG 24 hr capsule Take 1 capsule (180 mg total) by mouth daily. 06/09/15  Yes Leonie Man, MD  ferrous sulfate 325 (65 FE) MG tablet Take 325 mg by mouth 2 (two) times daily with a meal.  09/26/14  Yes Historical Provider, MD  furosemide (LASIX) 20 MG tablet Take 1 tablet by mouth daily. 01/26/15  Yes Historical Provider, MD  glipiZIDE (GLUCOTROL XL) 5 MG 24 hr tablet Take 5 mg by mouth 2 (two) times daily.   Yes Historical Provider, MD  lisinopril-hydrochlorothiazide (PRINZIDE,ZESTORETIC) 20-12.5 MG tablet Take 1 tablet by mouth daily.  11/02/14  Yes Historical Provider, MD  loperamide (IMODIUM A-D) 2 MG tablet Take 2 mg by mouth 4 (four) times daily as needed for diarrhea or loose stools.   Yes Historical Provider, MD  metFORMIN (GLUCOPHAGE) 1000 MG tablet Take 1,000 mg by mouth 2 (two) times daily with a meal.  08/19/14  Yes Historical Provider, MD  pantoprazole (PROTONIX) 40 MG tablet Take 1 tablet (40 mg total) by mouth daily. 01/15/15  Yes Shanker Kristeen Mans, MD  Physical Exam: Filed Vitals:   07/17/15 0600 07/17/15 0631 07/17/15 0633 07/17/15 0700  BP: 100/64 102/73  94/64  Pulse:      Temp:   97.2 F (36.2 C)   TempSrc:   Rectal   Resp: 38 25  27  Height:      Weight:      SpO2:          Constitutional: NAD, calm, comfortable Eyes: PERRL, lids and conjunctivae normal ENMT: Mucous membranes are moist. Posterior pharynx clear of any exudate or lesions.Normal dentition.  Neck: normal, supple, no masses, no thyromegaly Respiratory: clear to auscultation bilaterally, no wheezing, no crackles. Normal respiratory effort. No accessory muscle use.  Cardiovascular: Regular rate and rhythm, no murmurs / rubs / gallops. No extremity edema. 2+ pedal pulses. No carotid bruits.  Abdomen: no tenderness, no masses palpated. No  hepatosplenomegaly. Bowel sounds positive.  Musculoskeletal: no clubbing / cyanosis. No joint deformity upper and lower extremities. Good ROM, no contractures. Normal muscle tone.  Skin: no rashes, lesions, ulcers. No induration Neurologic: CN 2-12 grossly intact. Sensation intact, DTR normal. Strength 5/5 x all 4 extremities.  Psychiatric: Normal judgment and insight. Alert and oriented x 3. Normal mood.    Labs on Admission: I have personally reviewed following labs and imaging studies  CBC:  Recent Labs Lab 07/17/15 0500 07/17/15 0518  WBC 5.7  --   NEUTROABS 4.8  --   HGB 7.1* 8.5*  HCT 21.7* 25.0*  MCV 89.3  --   PLT 421*  --    Basic Metabolic Panel:  Recent Labs Lab 07/17/15 0500 07/17/15 0518  NA 129* 129*  K 4.9 4.9  CL 95* 96*  CO2 17*  --   GLUCOSE 213* 207*  BUN 97* 90*  CREATININE 2.47* 2.50*  CALCIUM 8.7*  --    GFR: Estimated Creatinine Clearance: 29.2 mL/min (by C-G formula based on Cr of 2.5). Liver Function Tests:  Recent Labs Lab 07/17/15 0500  AST 25  ALT 13*  ALKPHOS 57  BILITOT 0.7  PROT 6.6  ALBUMIN 3.2*   No results for input(s): LIPASE, AMYLASE in the last 168 hours. No results for input(s): AMMONIA in the last 168 hours. Coagulation Profile:  Recent Labs Lab 07/17/15 0500  INR 5.81*   Cardiac Enzymes: No results for input(s): CKTOTAL, CKMB, CKMBINDEX, TROPONINI in the last 168 hours. BNP (last 3 results) No results for input(s): PROBNP in the last 8760 hours. HbA1C: No results for input(s): HGBA1C in the last 72 hours. CBG: No results for input(s): GLUCAP in the last 168 hours. Lipid Profile: No results for input(s): CHOL, HDL, LDLCALC, TRIG, CHOLHDL, LDLDIRECT in the last 72 hours. Thyroid Function Tests: No results for input(s): TSH, T4TOTAL, FREET4, T3FREE, THYROIDAB in the last 72 hours. Anemia Panel: No results for input(s): VITAMINB12, FOLATE, FERRITIN, TIBC, IRON, RETICCTPCT in the last 72 hours. Urine  analysis:    Component Value Date/Time   COLORURINE YELLOW 07/17/2015 0655   APPEARANCEUR CLOUDY* 07/17/2015 0655   LABSPEC 1.021 07/17/2015 0655   PHURINE 5.0 07/17/2015 0655   GLUCOSEU NEGATIVE 07/17/2015 0655   HGBUR NEGATIVE 07/17/2015 0655   BILIRUBINUR NEGATIVE 07/17/2015 0655   KETONESUR NEGATIVE 07/17/2015 0655   PROTEINUR NEGATIVE 07/17/2015 0655   UROBILINOGEN 1.0 12/20/2014 1110   NITRITE NEGATIVE 07/17/2015 0655   LEUKOCYTESUR NEGATIVE 07/17/2015 0655   Sepsis Labs: @LABRCNTIP (procalcitonin:4,lacticidven:4) )No results found for this or any previous visit (from the past 240 hour(s)).   Radiological Exams on  Admission: Dg Chest Port 1 View  07/17/2015  CLINICAL DATA:  Dyspnea today EXAM: PORTABLE CHEST 1 VIEW COMPARISON:  12/20/2014 FINDINGS: A single AP portable view of the chest demonstrates no focal airspace consolidation or alveolar edema. The lungs are grossly clear. There is no large effusion or pneumothorax. Cardiac and mediastinal contours appear unremarkable. IMPRESSION: No active disease. Electronically Signed   By: Andreas Newport M.D.   On: 07/17/2015 05:39    EKG: (Independently reviewed) Atrial fibrillation with ventricular rate 138 bpm, PVCs, QTC 532 ms, underlying wavy baseline artifact making interpretation difficult but no obvious signs of ischemia; QT prolongation is new but may be more related to wavy baseline and inability of machine to calculate QT  Assessment/Plan Principal Problem:   Bilateral Pulmonary Embolism/Hypotension -Patient presented with dyspnea and hypotension with quick look bedside ECHO concerning for RV strain; CT angiogram of the chest confirmed bilateral pulmonary emboli with moderate clot burden -In setting of initial unexplained coagulopathy I discussed with patient's oncologist Dr. Alen Blew who agrees appropriate to utilize IV heparin -Monitor for GI bleeding symptoms given history (see below)-if unable to tolerate heparin may need  to consider IVC filter placement -Because of underlying gastric cancer with history of ongoing occult GIB he is  not a candidate for thrombolytic therapy which is the gold standard of care in patient who presents with PE with hypotension and significant RV strain -Not hypoxemic at rest but we'll continue supplemental oxygen -Echocardiogram -Bilateral lower extremity duplex -Cycle troponin -With hypotension we'll continue to increase preload with volume resuscitation taking care not to overstretch RV to increase RV ischemia; if blood pressure drops and remains consistently below 90 systolic patient will need to transfer to ICU for pressor support; most recent blood pressure 107/93 with a pulse of 116  **1010: Per RN patient systolic blood pressure has been vacillating between 60 and 70 so orders given to initiate Neo-Synephrine infusion, she will also contact EDP for placement of central line; I have also consulted PCCM evaluate the patient for possible ICU admission given need for vasopressors   ** 1050: EDP/Dr. Lita Mains had been called by bedside RN to determine if central line needed. Upon his assessment of the patient he felt the patient was symptomatic with his anemia and decided to order 1 unit of packed red blood cells in addition to the Neo-Synephrine ordered as above. I also return to reevaluate the patient and noted the patient to the more sleepy than my initial evaluation and systolic blood pressures ranging from 70-80. I agree with continuing vasopressor and blood transfusion  ** Suspect hypotension driven by sx anemia as opposed to PE noting only moderate clot burden on CTA -appreciate assistance of PCCM who have assumed care of this patient  Active Problems:   Coagulopathy  -Discussed with Dr. Alen Blew who suspects patient may have underlying vitamin K deficiency; in setting of acute PE will defer any administration of replacement vitamin K to heme/onc -Repeat PT/INR stat ** PT 15.7/INR  1.24 so initial results were inaccurate    Acute kidney injury  -Patient's BUN and creatinine nearly double baseline of 20/1.1 in March 2017 -Patient was developing azotemia and April 2017 with BUN up to 54 and creatinine 1.6 noting he was on thiazide diuretics and Lasix prior to admission; held secondary to hypotension -Current creatinine 2.47 with a BUN of 97 -IV fluid hydration -Also on metformin in addition to diuretics and ACE inhibitor so likely has a degree of ATN; potentially may have been  having asymptomatic pulmonary emboli for a period of time that may or may not been associated with hypoxemia and/or relative hypotension that could also contribute to decreased renal perfusion -Follow labs and hold offending medications -Will go ahead and repeat BMET stat with recurrent hypotension    Iron deficiency anemia secondary to blood loss (chronic) -Baseline hemoglobin between 9.5 and 11 with trend downward since January of this year and April 13 hemoglobin was 9.5 suggestive of known slow GI bleed process -Current hemoglobin 7.1 on the CBC with an H&H hemoglobin of 8.5 -Repeat CBC stat with recurrent hypotension **see above re: PRBCs -Of note also has a history of chronic iron deficiency and per oncology notes has responded to IV iron in the past-check anemia panel ** Repeat hgb down to 6.1- d/w Dr Elsworth Soho who recommended inserting and NGT and aspirate to determine if having GIB sx's-I have ordered an additional unit of PRBCs for a total of two units wih CBC post transfusion    QT prolongation -Noted on initial EKG but this EKG also was significant baseline artifact and unsure accuracy -Repeat EKG and avoid offending medication    TxN1 adenocarcinoma of the gastric antrum -Last PET scan January 2017 mild progression without widespread disease and stable on Xeloda for palliative purposes -Last evaluated by oncology 06/17/15 -Dr. Alen Blew added this consultant -Will need to monitor for blood  loss anemia after the addition of heparin for PE; if develops issues relating to bleeding may need to reconsider anticoagulation favor IVC-cont PPI ** The patient has become more unstable since my initial evaluation and prognosis now is guarded-likely will need to explore goals of care with family and patient this admission    Dehydration with hyponatremia -Has clinical picture consistent with dehydration without reported history to support this -Receiving high-volume IV fluids secondary to acute PE with hypotension and RV strain -Labs as above    Essential hypertension/Hypotension -Current blood pressure suboptimal as described above -Holding preadmission carvedilol, diltiazem ex RR, Lasix, lisinopril hydrochlorothiazide -Echocardiogram October 2016 with preserved LV function EF 55-60% with normal wall thickness and no evidence of LVH, patient did have moderate biatrial enlargement and mild RV dilatation with pulmonary hypertension at 51 mmHg-?? If patient needs to continue diuretics in future    Diabetes mellitus type II, uncontrolled  -Holding metformin the setting of acute kidney injury and presumed ATN -Check CPK **475 -Hemoglobin A1c  -Sliding-scale insulin    Chronic atrial fibrillation  -Initially presented with RVR and now with some tachycardia but not significant -BB on hold -no AC in past to h/o GIB 2/2 gastric cancer -CHADVASC = 5      DVT prophylaxis: Full dose heparin Code Status: Full code  Family Communication: Son, Xavion Deronde at bedside Disposition Plan: Pending medical stability and anticipate discharge back to home environment; due to severity of illness may require PT/OT evaluation prior to discharge determination Consults called: Oncology/Shad[place]; Critical Care medicine/[place] Admission status: Inpatient/stepdown **blood pressure unstable and requiring pressors- admission to ICU per PCCM    ELLIS,ALLISON L. ANP-BC [facility] Pager  [phone]   If 7PM-7AM, please contact night-coverage [email or web address] Password [facility]  07/17/2015, 8:01 AM   Have examined Pt and discussed case with ANP Erin Hearing and agree with above plan. Care during the described time interval was provided by myself and ANP Erin Hearing  .  I have reviewed this patient's available data, including medical history, events of note, physical examination, and all test results as part of my evaluation. I  have personally reviewed and interpreted all radiology studies.

## 2015-07-17 NOTE — ED Notes (Signed)
Dr.Knott at bedside. 

## 2015-07-17 NOTE — ED Notes (Signed)
Called pharmacy to send up neo.

## 2015-07-17 NOTE — ED Notes (Signed)
Spoke with Ebony Hail, NP rt hypotension. CCM to be consulted.

## 2015-07-17 NOTE — ED Notes (Signed)
Paged admitting to [place], [place].

## 2015-07-17 NOTE — ED Notes (Signed)
Faxed breakfast order @ 1010. 

## 2015-07-18 DIAGNOSIS — I9589 Other hypotension: Secondary | ICD-10-CM

## 2015-07-18 LAB — COMPREHENSIVE METABOLIC PANEL
ALK PHOS: 47 U/L (ref 38–126)
ALT: 13 U/L — AB (ref 17–63)
ANION GAP: 11 (ref 5–15)
AST: 27 U/L (ref 15–41)
Albumin: 2.3 g/dL — ABNORMAL LOW (ref 3.5–5.0)
BILIRUBIN TOTAL: 1.3 mg/dL — AB (ref 0.3–1.2)
BUN: 48 mg/dL — AB (ref 6–20)
CALCIUM: 7.8 mg/dL — AB (ref 8.9–10.3)
CO2: 14 mmol/L — ABNORMAL LOW (ref 22–32)
CREATININE: 1.3 mg/dL — AB (ref 0.61–1.24)
Chloride: 110 mmol/L (ref 101–111)
GFR calc Af Amer: >60 mL/min (ref >60)
GFR calc non Af Amer: 54 mL/min — ABNORMAL LOW (ref >60)
GLUCOSE: 118 mg/dL — AB (ref 65–99)
Potassium: 3.9 mmol/L (ref 3.5–5.1)
SODIUM: 135 mmol/L (ref 135–145)
TOTAL PROTEIN: 5 g/dL — AB (ref 6.5–8.1)

## 2015-07-18 LAB — GLUCOSE, CAPILLARY
GLUCOSE-CAPILLARY: 113 mg/dL — AB (ref 65–99)
GLUCOSE-CAPILLARY: 135 mg/dL — AB (ref 65–99)
Glucose-Capillary: 101 mg/dL — ABNORMAL HIGH (ref 65–99)
Glucose-Capillary: 111 mg/dL — ABNORMAL HIGH (ref 65–99)
Glucose-Capillary: 113 mg/dL — ABNORMAL HIGH (ref 65–99)
Glucose-Capillary: 118 mg/dL — ABNORMAL HIGH (ref 65–99)

## 2015-07-18 LAB — CBC
HCT: 25.5 % — ABNORMAL LOW (ref 39.0–52.0)
Hemoglobin: 8.6 g/dL — ABNORMAL LOW (ref 13.0–17.0)
MCH: 29.8 pg (ref 26.0–34.0)
MCHC: 33.7 g/dL (ref 30.0–36.0)
MCV: 88.2 fL (ref 78.0–100.0)
Platelets: 318 10*3/uL (ref 150–400)
RBC: 2.89 MIL/uL — AB (ref 4.22–5.81)
RDW: 20.5 % — AB (ref 11.5–15.5)
WBC: 5.6 10*3/uL (ref 4.0–10.5)

## 2015-07-18 LAB — HEPARIN LEVEL (UNFRACTIONATED)
Heparin Unfractionated: 0.75 IU/mL — ABNORMAL HIGH (ref 0.30–0.70)
Heparin Unfractionated: 0.56 IU/mL (ref 0.30–0.70)
Heparin Unfractionated: 0.61 IU/mL (ref 0.30–0.70)

## 2015-07-18 LAB — PROCALCITONIN: Procalcitonin: 0.43 ng/mL

## 2015-07-18 LAB — TROPONIN I: Troponin I: 0.14 ng/mL — ABNORMAL HIGH (ref <0.031)

## 2015-07-18 LAB — URINE CULTURE: CULTURE: NO GROWTH

## 2015-07-18 MED ORDER — PROCHLORPERAZINE EDISYLATE 5 MG/ML IJ SOLN
5.0000 mg | Freq: Four times a day (QID) | INTRAMUSCULAR | Status: DC | PRN
  Administered 2015-07-18: 5 mg via INTRAVENOUS
  Filled 2015-07-18 (×3): qty 1

## 2015-07-18 MED ORDER — HEPARIN (PORCINE) IN NACL 100-0.45 UNIT/ML-% IJ SOLN
1150.0000 [IU]/h | INTRAMUSCULAR | Status: DC
  Administered 2015-07-18: 1150 [IU]/h via INTRAVENOUS
  Filled 2015-07-18 (×2): qty 250

## 2015-07-18 NOTE — Progress Notes (Signed)
PULMONARY / CRITICAL CARE MEDICINE   Name: Patrick [name]. MRN: [id number] DOB: 05-26-1945    ADMISSION DATE:  07/17/2015 CONSULTATION DATE:  5/13  REFERRING MD:  Triad   CHIEF COMPLAINT:  Hypotension   HISTORY OF PRESENT ILLNESS:  70yo male with hx invasive adenocarcinoma of the stomach s/p xrt on palliative Xeloda (progression on PET in 03/2015), HTN, DM, chronic anemia, AFib (not on anticoagulation r/t anemia) presented 5/13 with 2 day hx increasing SOB suddenly worse the am of admission.  In ER pt was hypotensive requiring pressors, down trending hgb, echo revealed evidence of RV strain and CTA chest revealed bilat PE and PCCM consulted.   Last radiation was last year.     SUBJECTIVE:  Low gr febrile Off bipap Remains on neo gtt   VITAL SIGNS: BP 96/56 mmHg  Pulse 94  Temp(Src) 98.7 F (37.1 C) (Oral)  Resp 23  Ht 6' (1.829 m)  Wt 184 lb 15.5 oz (83.9 kg)  BMI 25.08 kg/m2  SpO2 100%  HEMODYNAMICS:    VENTILATOR SETTINGS: Vent Mode:  [-]  FiO2 (%):  [40 %] 40 %  INTAKE / OUTPUT: I/O last 3 completed shifts: In: 8011.4 [I.V.:6956.4; Blood:335; NG/GT:120; IV Piggyback:600] Out: [id number] [Urine:2505; Emesis/NG output:1450]  PHYSICAL EXAMINATION: General:  Pleasant, frail, chronically ill appearing male, NAD  Neuro:  Awake, alert, appropriate, MAE, gen weakness  HEENT:  Mm dry, no JVD , NG dark aspirate Cardiovascular:  s1s2 irreg  Lungs:  resps even non labored on [place], clear  Abdomen:  Round, mildly distended, mildly tender, hypoactive bs   Musculoskeletal:  Warm and dry, scant BLE edema, symmetric BLE    LABS:  BMET  Recent Labs Lab 07/17/15 0500 07/17/15 0518 07/17/15 1034 07/18/15 0215  NA 129* 129* 131* 135  K 4.9 4.9 4.4 3.9  CL 95* 96* 106 110  CO2 17*  --  10* 14*  BUN 97* 90* 82* 48*  CREATININE 2.47* 2.50* 1.99* 1.30*  GLUCOSE 213* 207* 157* 118*    Electrolytes  Recent Labs Lab 07/17/15 0500 07/17/15 1034 07/17/15 1600  07/18/15 0215  CALCIUM 8.7* 7.0*  --  7.8*  MG  --   --  2.1  --   PHOS  --   --  4.8*  --     CBC  Recent Labs Lab 07/17/15 1034 07/17/15 1600 07/18/15 0215  WBC 5.0 5.6 5.6  HGB 6.1* 8.5* 8.6*  HCT 18.2* 25.4* 25.5*  PLT 364 363 318    Coag's  Recent Labs Lab 07/17/15 0500 07/17/15 0723 07/17/15 1034  APTT 48* 83*  --   INR 5.81*  --  1.24    Sepsis Markers  Recent Labs Lab 07/17/15 0723 07/17/15 0735 07/17/15 1034 07/17/15 1600 07/18/15 0215  LATICACIDVEN 1.4 1.35 2.0  --   --   PROCALCITON  --   --   --  0.38 0.43    ABG  Recent Labs Lab 07/17/15 1116 07/17/15 1658 07/17/15 1825  PHART 7.342* 7.332* 7.355  PCO2ART 27.7* 24.0* 22.5*  PO2ART 81.0 61.0* 112.0*    Liver Enzymes  Recent Labs Lab 07/17/15 0500 07/18/15 0215  AST 25 27  ALT 13* 13*  ALKPHOS 57 47  BILITOT 0.7 1.3*  ALBUMIN 3.2* 2.3*    Cardiac Enzymes  Recent Labs Lab 07/17/15 1600 07/17/15 2000 07/18/15 0215  TROPONINI 0.06* 0.10* 0.14*    Glucose  Recent Labs Lab 07/17/15 0821 07/17/15 1614 07/17/15 2039 07/18/15 0001 07/18/15 0433  GLUCAP 138* 126* 110* 113* 111*    Imaging Ct Angio Chest Pe W/cm &/or Wo Cm  07/17/2015  CLINICAL DATA:  Shortness of breath and abdominal pain after train ride. Evaluate for pulmonary embolus. EXAM: CT ANGIOGRAPHY CHEST WITH CONTRAST TECHNIQUE: Multidetector CT imaging of the chest was performed using the standard protocol during bolus administration of intravenous contrast. Multiplanar CT image reconstructions and MIPs were obtained to evaluate the vascular anatomy. CONTRAST:  75 cc of Isovue 370 COMPARISON:  PET-CT March 31, 2015 FINDINGS: Bilateral pulmonary emboli are seen involving the bilateral upper, right middle, and bilateral lower lobes. The pulmonary embolus burden is moderate. The thoracic aorta is not well assessed due to timing of contrast but no aneurysm or dissection is seen. Coronary artery calcifications are  identified. The esophagus is fluid filled. There is a lymph node in the epicardial fat anterior to the liver on series 4, image 99 which measures 2 cm in greatest dimension versus 10 mm previously. This is much larger in the interval. The subcarinal node may be mildly more prominent. No other adenopathy seen in the chest. There is a tiny right pleural effusion. No left pleural or pericardial effusion. Cardiomegaly persists. The central airways are stable. No pneumothorax. No new pulmonary nodules, masses, or infiltrates are identified. The stomach is distended with fluid and air. Marked thickening of the gastric antrum and distal body is again identified. Adenopathy again seen anterior to the gastric antrum. No other acute abnormalities are seen within the abdomen. There is a lymph node just medial to the stomach on image 117 which is new in the interval. No bony metastatic disease is seen. Review of the MIP images confirms the above findings. IMPRESSION: 1. Bilateral pulmonary emboli identified with a moderate burden. 2. There is an enlarging node in the epicardial fat anterior to the liver. Findings of known gastric cancer seen in the distal gastric body and antrum with worsening nodes in the upper abdomen. 3. Gastric distention suggesting at least partial gastric outlet obstruction. Findings called to Dr. Lita Mains Electronically Signed   By: Dorise Bullion III M.D   On: 07/17/2015 08:07     STUDIES:  CTA chest 5/13>>> 1. Bilateral pulmonary emboli identified with a moderate burden. 2. There is an enlarging node in the epicardial fat anterior to the liver. Findings of known gastric cancer seen in the distal gastric body and antrum with worsening nodes in the upper abdomen. 3. Gastric distention suggesting at least partial gastric outlet obstruction  Echo 5/13  flattening of interventricular septum with RVSP 77  CULTURES: BCx2 5/13>>> Urine 5/13>>>   ANTIBIOTICS: vanc 5/13>>> Zosyn  5/13>>>  SIGNIFICANT EVENTS:   LINES/TUBES:   DISCUSSION: 70yo male with invasive adenocarcinoma of the stomach s/p xrt on current oral chemo and chronic anemia presents 5/13 with dyspnea and shock.  Found to have bilat PE.    ASSESSMENT / PLAN:  PULMONARY Acute BL massive PE    Rt calf DVT  5/13 Acute resp distress after NGT placement requiring bipap P:   bipap prn only Supplemental O2    CARDIOVASCULAR Hypotension/shock -- suspect cardiogenic in setting PE, however only mild hypoxia, no tachycardia.  Shock seems out of proportion to PE alone.  Cannot r/o sepsis on current chemo although no clear source.  ?active bleeding.   PE -- complicated by chronic anemia.     AFib NOT on anticoagulation for AFib r/t high risk of bleeding. P:  Heparin gtt per pharmacy - NOT a  candidate for EKOS or systemic thrombolysis r/t bleeding risk Pressors - titrate as needed to maintain SBP 90 Hold home B blocker    RENAL Mild metabolic acidosis  AKI -- multifactorial r/t hypotension c/b metformin, diuretics, ace-i at home  Hyponatremia  P:   F/u chem  Volume resuscitation  Hold metformin, lasix, lisinopril   GASTROINTESTINAL Invasive gastric adenocarcinoma  Anemia  ?gastric outlet obstruction  P:   NPO  NGT to LIS - consider clamping in 24h PPI  Consider GI input if worsening anemia   HEMATOLOGIC Acute on chronic anemia - no s/s active bleeding.  C/b PE and need for anticoagulation.  S/ p PRBC x 2  P:  F/u CBC  Monitor closely on heparin gtt for PE  Not candidate for thrombolytics  Anemia panel pending   INFECTIOUS Shock - ??sepsis although no clear source infection. On oral palliative chemo  P:   Pan culture  Low pro calcitonin -stop broad spectrum abx once off pressors  ENDOCRINE DM   P:   SSI   NEUROLOGIC No active issue  P:   Supportive care    FAMILY  - Updates:  Pt updated at length 5/13 in ER.  Discussed code status -- pt seems as though he wants to  continue aggressive medical care but would not want intubation, CPR/defib.  However he is not ready to definitively make that decision.  Will continue goals of care discussions.     - Inter-disciplinary family meet or Palliative Care meeting due by:  day 7    The patient is critically ill with multiple organ systems failure and requires high complexity decision making for assessment and support, frequent evaluation and titration of therapies, application of advanced monitoring technologies and extensive interpretation of multiple databases. Critical Care Time devoted to patient care services described in this note independent of APP time is 31 minutes.   Kara Mead MD. [place] [facility].  Pulmonary & Critical care Pager [phone] If no response call [phone]   07/18/2015   07/18/2015  7:31 AM

## 2015-07-18 NOTE — Progress Notes (Signed)
Patient is currently on room air with sats of 100%. Patient is in no distress and all vitals are stable. BIPAP is in room on standby but is not needed at this time. Will continue to monitor.

## 2015-07-18 NOTE — Progress Notes (Signed)
ANTICOAGULATION CONSULT NOTE - FOLLOW UP    HL = 0.61 (goal 0.3 - 0.7 units/mL) Heparin dosing weight = 84 kg   Assessment: 69 YOM to continue on IV heparin for new, bilateral PE with moderate burden and left DVT. Heparin level is therapeutic; no bleeding reported.   Plan: - Reduce heparin gtt slightly to 1150 units/hr to prevent HL from becoming supra-therapeutic again - F/U AM labs    Hazelle Woollard D. Mina Marble, PharmD, BCPS 07/18/2015, 5:52 PM

## 2015-07-18 NOTE — Plan of Care (Signed)
Problem: Physical Regulation: Goal: Ability to maintain clinical measurements within normal limits will improve Outcome: Progressing Progressing with treatment, will continue to monitor the appropriateness of same  Problem: Activity: Goal: Risk for activity intolerance will decrease Outcome: Not Progressing Pt still very fatigued with movement   Problem: Nutrition: Goal: Adequate nutrition will be maintained Outcome: Not Applicable Date Met:  68/12/75 NPO at this time     Problem: Phase I Progression Outcomes Goal: Pain controlled with appropriate interventions Outcome: Progressing Pt remains pain-free at this time, will continue to monitor Goal: Tolerating diet Outcome: Not Applicable Date Met:  17/00/17 NPO at this time Goal: Voiding-avoid urinary catheter unless indicated Outcome: Progressing Pt able to use urinal without complication  Goal: Hemodynamically stable Outcome: Progressing Stability maintained with treatment, will continue to monitor

## 2015-07-18 NOTE — Progress Notes (Signed)
ANTICOAGULATION CONSULT NOTE - Follow Up Consult  Pharmacy Consult for Heparin  Indication: pulmonary embolus  No Known Allergies  Patient Measurements: Height: 6' (182.9 cm) Weight: 184 lb 4.9 oz (83.6 kg) IBW/kg (Calculated) : 77.6  Vital Signs: Temp: 100.8 F (38.2 C) (05/14 0000) Temp Source: Axillary (05/14 0000) BP: 102/59 mmHg (05/14 0100) Pulse Rate: 119 (05/14 0100)  Labs:  Recent Labs  07/17/15 0500 07/17/15 0518 07/17/15 0723 07/17/15 1034 07/17/15 1600 07/17/15 2000 07/17/15 2355  HGB 7.1* 8.5*  --  6.1* 8.5*  --   --   HCT 21.7* 25.0*  --  18.2* 25.4*  --   --   PLT 421*  --   --  364 363  --   --   APTT 48*  --  83*  --   --   --   --   LABPROT 50.3*  --   --  15.7*  --   --   --   INR 5.81*  --   --  1.24  --   --   --   HEPARINUNFRC  --   --   --   --  0.56  --  0.75*  CREATININE 2.47* 2.50*  --  1.99*  --   --   --   CKTOTAL  --   --  475*  --   --   --   --   TROPONINI  --   --   --   --  0.06* 0.10*  --     Estimated Creatinine Clearance: 37.4 mL/min (by C-G formula based on Cr of 1.99).  Assessment: Heparin for new onset PE, HL is slightly elevated this AM, Hgb up to 8.5 after transfusion, no issues per RN.   Goal of Therapy:  Heparin level 0.3-0.7 units/ml Monitor platelets by anticoagulation protocol: Yes   Plan:  -Decrease heparin to 1200 units/hr -0930 HL -Trend Hgb/monitor for bleeding  Narda Bonds 07/18/2015,1:20 AM

## 2015-07-18 NOTE — Plan of Care (Signed)
Problem: Physical Regulation: Goal: Ability to maintain clinical measurements within normal limits will improve Outcome: Progressing Progressing with treatment, will continue to monitor  Problem: Activity: Goal: Risk for activity intolerance will decrease Outcome: Not Progressing Pt still very fatigued with activity

## 2015-07-18 NOTE — Progress Notes (Signed)
ANTICOAGULATION CONSULT NOTE - Follow Up Consult  Pharmacy Consult for Heparin  Indication: pulmonary embolus  No Known Allergies  Patient Measurements: Height: 6' (182.9 cm) Weight: 184 lb 15.5 oz (83.9 kg) IBW/kg (Calculated) : 77.6  Vital Signs: Temp: 98.1 F (36.7 C) (05/14 0800) Temp Source: Oral (05/14 0800) BP: 104/65 mmHg (05/14 1100) Pulse Rate: 38 (05/14 1045)  Labs:  Recent Labs  07/17/15 0500 07/17/15 0518 07/17/15 0723 07/17/15 1034 07/17/15 1600 07/17/15 2000 07/17/15 2355 07/18/15 0215 07/18/15 0946  HGB 7.1* 8.5*  --  6.1* 8.5*  --   --  8.6*  --   HCT 21.7* 25.0*  --  18.2* 25.4*  --   --  25.5*  --   PLT 421*  --   --  364 363  --   --  318  --   APTT 48*  --  83*  --   --   --   --   --   --   LABPROT 50.3*  --   --  15.7*  --   --   --   --   --   INR 5.81*  --   --  1.24  --   --   --   --   --   HEPARINUNFRC  --   --   --   --  0.56  --  0.75*  --  0.56  CREATININE 2.47* 2.50*  --  1.99*  --   --   --  1.30*  --   CKTOTAL  --   --  475*  --   --   --   --   --   --   TROPONINI  --   --   --   --  0.06* 0.10*  --  0.14*  --     Estimated Creatinine Clearance: 57.2 mL/min (by C-G formula based on Cr of 1.3).  Assessment: Heparin for new onset PE, HL was slightly elevated this AM and rate was decreased. Now heparin level in range at 0.56 units/mL on 1200 units/hr. S/p transfusion- Hgb stable at 8.6, plts WNL- no bleeding noted  Goal of Therapy:  Heparin level 0.3-0.7 units/ml Monitor platelets by anticoagulation protocol: Yes   Plan:  -Continue heparin at 1200 units/hr -Confirmatory heparin level at 1600 -Daily HL and CBC -Follow long term AC plans  Coryn Mosso D. Lauree Yurick, PharmD, BCPS Clinical Pharmacist Pager: [phone] 07/18/2015 11:10 AM

## 2015-07-19 DIAGNOSIS — Z515 Encounter for palliative care: Secondary | ICD-10-CM

## 2015-07-19 DIAGNOSIS — Z7189 Other specified counseling: Secondary | ICD-10-CM

## 2015-07-19 DIAGNOSIS — K311 Adult hypertrophic pyloric stenosis: Secondary | ICD-10-CM | POA: Diagnosis present

## 2015-07-19 DIAGNOSIS — N179 Acute kidney failure, unspecified: Secondary | ICD-10-CM

## 2015-07-19 LAB — GLUCOSE, CAPILLARY
GLUCOSE-CAPILLARY: 127 mg/dL — AB (ref 65–99)
GLUCOSE-CAPILLARY: 130 mg/dL — AB (ref 65–99)
GLUCOSE-CAPILLARY: 131 mg/dL — AB (ref 65–99)
GLUCOSE-CAPILLARY: 136 mg/dL — AB (ref 65–99)
GLUCOSE-CAPILLARY: 92 mg/dL (ref 65–99)
Glucose-Capillary: 114 mg/dL — ABNORMAL HIGH (ref 65–99)

## 2015-07-19 LAB — CBC
HCT: 24.7 % — ABNORMAL LOW (ref 39.0–52.0)
Hemoglobin: 8 g/dL — ABNORMAL LOW (ref 13.0–17.0)
MCH: 29.2 pg (ref 26.0–34.0)
MCHC: 32.4 g/dL (ref 30.0–36.0)
MCV: 90.1 fL (ref 78.0–100.0)
PLATELETS: 290 10*3/uL (ref 150–400)
RBC: 2.74 MIL/uL — AB (ref 4.22–5.81)
RDW: 21.4 % — AB (ref 11.5–15.5)
WBC: 5.3 10*3/uL (ref 4.0–10.5)

## 2015-07-19 LAB — BASIC METABOLIC PANEL
Anion gap: 9 (ref 5–15)
BUN: 27 mg/dL — AB (ref 6–20)
CALCIUM: 7.9 mg/dL — AB (ref 8.9–10.3)
CO2: 15 mmol/L — AB (ref 22–32)
CREATININE: 1.11 mg/dL (ref 0.61–1.24)
Chloride: 113 mmol/L — ABNORMAL HIGH (ref 101–111)
GFR calc non Af Amer: >60 mL/min (ref >60)
Glucose, Bld: 156 mg/dL — ABNORMAL HIGH (ref 65–99)
Potassium: 4.1 mmol/L (ref 3.5–5.1)
SODIUM: 137 mmol/L (ref 135–145)

## 2015-07-19 LAB — HEMOGLOBIN AND HEMATOCRIT, BLOOD
HCT: 26.3 % — ABNORMAL LOW (ref 39.0–52.0)
HEMATOCRIT: 24.4 % — AB (ref 39.0–52.0)
HEMATOCRIT: 26.1 % — AB (ref 39.0–52.0)
HEMOGLOBIN: 8.2 g/dL — AB (ref 13.0–17.0)
Hemoglobin: 8.5 g/dL — ABNORMAL LOW (ref 13.0–17.0)
Hemoglobin: 8.7 g/dL — ABNORMAL LOW (ref 13.0–17.0)

## 2015-07-19 LAB — HEMOGLOBIN A1C
HEMOGLOBIN A1C: 6.6 % — AB (ref 4.8–5.6)
MEAN PLASMA GLUCOSE: 143 mg/dL

## 2015-07-19 LAB — PROCALCITONIN: Procalcitonin: 0.26 ng/mL

## 2015-07-19 LAB — HEPARIN LEVEL (UNFRACTIONATED): HEPARIN UNFRACTIONATED: 0.42 [IU]/mL (ref 0.30–0.70)

## 2015-07-19 MED ORDER — METOPROLOL TARTRATE 5 MG/5ML IV SOLN
5.0000 mg | INTRAVENOUS | Status: DC | PRN
  Administered 2015-07-19 – 2015-07-20 (×3): 5 mg via INTRAVENOUS
  Administered 2015-07-20: 2.5 mg via INTRAVENOUS
  Administered 2015-07-21 (×2): 5 mg via INTRAVENOUS
  Filled 2015-07-19 (×6): qty 5

## 2015-07-19 MED ORDER — BIOTENE DRY MOUTH MT LIQD: 15.0000 mL | OROMUCOSAL | Status: DC | PRN

## 2015-07-19 NOTE — Progress Notes (Signed)
PULMONARY / CRITICAL CARE MEDICINE   Name: Patrick [name]. MRN: [id number] DOB: 07/19/1945    ADMISSION DATE:  07/17/2015 CONSULTATION DATE:  5/13  REFERRING MD:  Triad   CHIEF COMPLAINT:  Hypotension   HISTORY OF PRESENT ILLNESS:  70yo male with hx invasive adenocarcinoma of the stomach s/p xrt on palliative Xeloda (progression on PET in 03/2015), HTN, DM, chronic anemia, AFib (not on anticoagulation r/t anemia) presented 5/13 with 2 day hx increasing SOB suddenly worse the am of admission.  In ER pt was hypotensive requiring pressors, down trending hgb, echo revealed evidence of RV strain and CTA chest revealed bilat PE and PCCM consulted.   Last radiation was last year.     SUBJECTIVE:  Off neo last night. Had nausea/abd pain, NGT to suction > coffee ground output Tachycardiac comfortable  VITAL SIGNS: BP 102/63 mmHg  Pulse 110  Temp(Src) 97.3 F (36.3 C) (Oral)  Resp 19  Ht 6' (1.829 m)  Wt 83.7 kg (184 lb 8.4 oz)  BMI 25.02 kg/m2  SpO2 99%  HEMODYNAMICS:    VENTILATOR SETTINGS:    INTAKE / OUTPUT: I/O last 3 completed shifts: In: 4538.1 [P.O.:90; I.V.:3958.1; NG/GT:240; IV Piggyback:250] Out: 3926 [Urine:2625; Emesis/NG output:1300; Stool:1]  PHYSICAL EXAMINATION: General:  Pleasant, frail, chronically ill appearing male, NAD  Neuro:  Awake, alert, appropriate, MAE, gen weakness  HEENT:  Mm dry, no JVD , NG dark aspirate Cardiovascular:  s1s2 irreg  Lungs:  resps even non labored on [place], bibasilar crackles Abdomen:  Round, mildly distended, mildly tender, hypoactive bs   Musculoskeletal:  Warm and dry, scant BLE edema, symmetric BLE    LABS:  BMET  Recent Labs Lab 07/17/15 1034 07/18/15 0215 07/19/15 0400  NA 131* 135 137  K 4.4 3.9 4.1  CL 106 110 113*  CO2 10* 14* 15*  BUN 82* 48* 27*  CREATININE 1.99* 1.30* 1.11  GLUCOSE 157* 118* 156*    Electrolytes  Recent Labs Lab 07/17/15 1034 07/17/15 1600 07/18/15 0215 07/19/15 0400   CALCIUM 7.0*  --  7.8* 7.9*  MG  --  2.1  --   --   PHOS  --  4.8*  --   --     CBC  Recent Labs Lab 07/17/15 1600 07/18/15 0215 07/19/15 0400  WBC 5.6 5.6 5.3  HGB 8.5* 8.6* 8.0*  HCT 25.4* 25.5* 24.7*  PLT 363 318 290    Coag's  Recent Labs Lab 07/17/15 0500 07/17/15 0723 07/17/15 1034  APTT 48* 83*  --   INR 5.81*  --  1.24    Sepsis Markers  Recent Labs Lab 07/17/15 0723 07/17/15 0735 07/17/15 1034 07/17/15 1600 07/18/15 0215 07/19/15 0400  LATICACIDVEN 1.4 1.35 2.0  --   --   --   PROCALCITON  --   --   --  0.38 0.43 0.26    ABG  Recent Labs Lab 07/17/15 1116 07/17/15 1658 07/17/15 1825  PHART 7.342* 7.332* 7.355  PCO2ART 27.7* 24.0* 22.5*  PO2ART 81.0 61.0* 112.0*    Liver Enzymes  Recent Labs Lab 07/17/15 0500 07/18/15 0215  AST 25 27  ALT 13* 13*  ALKPHOS 57 47  BILITOT 0.7 1.3*  ALBUMIN 3.2* 2.3*    Cardiac Enzymes  Recent Labs Lab 07/17/15 1600 07/17/15 2000 07/18/15 0215  TROPONINI 0.06* 0.10* 0.14*    Glucose  Recent Labs Lab 07/18/15 0739 07/18/15 1249 07/18/15 1807 07/18/15 1911 07/18/15 2353 07/19/15 0347  GLUCAP 113* 101* 118* 135* 136*  131*    Imaging No results found.   STUDIES:  CTA chest 5/13>>> 1. Bilateral pulmonary emboli identified with a moderate burden. 2. There is an enlarging node in the epicardial fat anterior to the liver. Findings of known gastric cancer seen in the distal gastric body and antrum with worsening nodes in the upper abdomen. 3. Gastric distention suggesting at least partial gastric outlet obstruction  Echo 5/13  flattening of interventricular septum with RVSP 77  CULTURES: BCx2 5/13>>> Urine 5/13>>>   ANTIBIOTICS: vanc 5/13>>> Zosyn 5/13>>>  SIGNIFICANT EVENTS:   LINES/TUBES:   DISCUSSION: 70yo male with invasive adenocarcinoma of the stomach s/p xrt on current oral chemo and chronic anemia presents 5/13 with dyspnea and shock.  Found to have bilat PE.     ASSESSMENT / PLAN:  PULMONARY Acute BL massive PE    Rt calf DVT  5/13 Acute resp distress after NGT placement requiring bipap > off bipap since 5/14 P:   bipap prn only Supplemental O2    CARDIOVASCULAR Hypotension/shock -- suspect cardiogenic in setting PE, however only mild hypoxia, no tachycardia.  Shock seems out of proportion to PE alone.  Cannot r/o sepsis on current chemo although no clear source.  possible active bleeding.   PE -- complicated by chronic anemia.     AFib NOT on anticoagulation for AFib r/t high risk of bleeding. In and out of RVR P:  Heparin gtt per pharmacy - NOT a candidate for EKOS or systemic thrombolysis r/t bleeding risk Pressors - titrate as needed to maintain SBP 90 > off pressors now PRN lopressor. Pt in and out of afib RVR. BP low normal.  Check Hb and Hct > if dropping, will d/c and place IVC filter.    RENAL Mild metabolic acidosis  AKI -- multifactorial r/t hypotension c/b metformin, diuretics, ace-i at home  Hyponatremia  P:   F/u chem  Volume resuscitation > dec IVF to 75 mls/hr Hold metformin, lasix, lisinopril   GASTROINTESTINAL Invasive gastric adenocarcinoma  Anemia  ?gastric outlet obstruction  P:   NPO  NGT to LIS  PPI  Consider GI input if worsening anemia   HEMATOLOGIC Acute on chronic anemia - no s/s active bleeding.  C/b PE and need for anticoagulation.  S/ p PRBC x 2  P:  F/u CBC > check hb and hct > if dropping, will d/c heparin drip Monitor closely on heparin gtt for PE  Not candidate for thrombolytics  Anemia panel pending    INFECTIOUS Shock - ??sepsis although no clear source infection. On oral palliative chemo  P:   Pan culture > (-) so far. Will d/c Abx  ENDOCRINE DM   P:   SSI   NEUROLOGIC No active issue  P:   Supportive care    FAMILY  - Updates:  Pt updated at length 5/15.  Pt is thinking of being DNR, no CPR but wants to talk to family today.   - Inter-disciplinary family meet or  Palliative Care meeting due by:  day 7   J. Shirl Harris, MD 07/19/2015, 8:34 AM [place] [facility] Pager [phone] After 3 pm or if no answer, call [phone]

## 2015-07-19 NOTE — Clinical Documentation Improvement (Signed)
Critical Care  Can the diagnosis of DVT be further specified? Please document in the progress notes if you agree that left DVT is present, and if so, document if it was present on admission.    Deep vein thrombosis, including specific vein(s) and laterality, acute or chronic, history of/healed with no prophylaxis  Deep vein thrombophlebitis, including specific vein(s) and laterality, acute or chronic, history of/healed with no prophylaxis  Both deep vein thrombosis and thrombophlebitis,  including specific vein(s) and laterality, acute or chronic, history of/healed with no prophylaxis  Other  Clinically Undetermined  Document any associated diagnoses/conditions.   Supporting Information: 20 YOM to continue on IV heparin for new, bilateral PE with moderate burden and left DVT per 5/14 Pharmacy note.   Please exercise your independent, professional judgment when responding. A specific answer is not anticipated or expected.   Thank You,  [place] [phone]

## 2015-07-19 NOTE — Progress Notes (Signed)
ANTICOAGULATION CONSULT NOTE - Follow Up Consult  Pharmacy Consult for Heparin  Indication: pulmonary embolus  No Known Allergies  Patient Measurements: Height: 6' (182.9 cm) Weight: 184 lb 8.4 oz (83.7 kg) IBW/kg (Calculated) : 77.6  Vital Signs: Temp: 98.4 F (36.9 C) (05/15 0900) Temp Source: Oral (05/15 0900) BP: 102/63 mmHg (05/15 0700) Pulse Rate: 110 (05/15 0700)  Labs:  Recent Labs  07/17/15 0500  07/17/15 0723 07/17/15 1034 07/17/15 1600 07/17/15 2000  07/18/15 0215 07/18/15 0946 07/18/15 1728 07/19/15 0400 07/19/15 0940  HGB 7.1*  < >  --  6.1* 8.5*  --   --  8.6*  --   --  8.0* 8.2*  HCT 21.7*  < >  --  18.2* 25.4*  --   --  25.5*  --   --  24.7* 24.4*  PLT 421*  --   --  364 363  --   --  318  --   --  290  --   APTT 48*  --  83*  --   --   --   --   --   --   --   --   --   LABPROT 50.3*  --   --  15.7*  --   --   --   --   --   --   --   --   INR 5.81*  --   --  1.24  --   --   --   --   --   --   --   --   HEPARINUNFRC  --   --   --   --  0.56  --   < >  --  0.56 0.61 0.42  --   CREATININE 2.47*  < >  --  1.99*  --   --   --  1.30*  --   --  1.11  --   CKTOTAL  --   --  475*  --   --   --   --   --   --   --   --   --   TROPONINI  --   --   --   --  0.06* 0.10*  --  0.14*  --   --   --   --   < > = values in this interval not displayed.  Estimated Creatinine Clearance: 67 mL/min (by C-G formula based on Cr of 1.11).  Assessment: Heparin for new onset PE, heparin level remains at goal at 0.42. CBC is stable and no bleeding noted.   Goal of Therapy:  Heparin level 0.3-0.7 units/ml Monitor platelets by anticoagulation protocol: Yes   Plan:  -Continue heparin at 1150 units/hr -Daily HL and CBC -Follow long term [facility] plans  Salome Arnt, PharmD, BCPS Pager # [phone] 07/19/2015 10:21 AM

## 2015-07-19 NOTE — Consult Note (Signed)
Consultation Note Date: 07/19/2015   Patient Name: Patrick [name].  DOB: Mar 19, 1945  MRN: [id number]  Age / Sex: 70 y.o., male  PCP: Thressa Sheller, MD Referring Physician: Rigoberto Noel, MD  Reason for Consultation: Establishing goals of care  HPI/Patient Profile: 70 y.o. male  with past medical history of invasive adenocarcinoma of the stomach followed by Dr. Alen Blew, DMII, and stoke, who was admitted on 07/17/2015 with SOB, hematemesis and hypotension.  He was found to have bilateral pulmonary embolism, and likely partial gastric outlet obstruction.  Clinical Assessment and Goals of Care: I spoke with Patrick [name] in 2S alone this morning.  His main complaint is thirst. He also complains of new bilateral shoulder pain.  He tells me he lives with his son and that prior to admission he was walking, getting out of the house and caring for all of his own ADLs.  His attending physician spoke with him about code status this morning.  He does not think he wants CPR or Intubation but wants to talk with his daughter 1st so that he does not make a bad decision.  I spoke with his step-daughter Patrick Jaeger - "Pernell Dupre" [phone]) on the phone.  She wanted to know about his condition.  She mentioned that her grand mother had just died.  The last time she saw her step-father he looked weak.   She asked what type of cancer he has and whether or not it was metastatic.  It does not appear that Patrick [name] has talked with her about his health.  We discussed his cancer and the spread to the gastro-hepatic nodes as well as palliative chemo.  We also discussed the bilateral PEs and the difficulty with anticoagulation given his anemia and hematemesis.  I told Pernell Dupre that I have seen people die in situations like her father's.  She asked if she should drive up from [place] and I responded that I thought coming up would be  appropriate.   We discussed code status and that her father wants to speak with her about it.  Pernell Dupre told me that she works in cardiology - but she did not give me an indication of her opinion about what her father's code status should be.  NEXT OF KIN:  Patrick [name]:  [id number]    SUMMARY OF RECOMMENDATIONS    Palliative Medicine will follow with you.  Will schedule a goals of care conversation with the patient and his family ASAP.  Family and patient do not seem to grasp the severity of his condition.   Code Status/Advance Care Planning:  Full code    Symptom Management:   Add biotene for dry mouth  Agree with N/G placement and conservative management  Pending patient's goals of care and cause of obstruction - may consider venting gastrostomy.  Palliative Prophylaxis:   Frequent Pain Assessment and Oral Care  Psycho-social/Spiritual:   Desire for further Chaplaincy support: Not at this time.  Additional Recommendations: Caregiving  Support/Resources  Prognosis:   Unable to determine  Discharge Planning: To Be Determined      Primary Diagnoses: Present on Admission:  . Hypotension . TxN1 adenocarcinoma of the gastric antrum . Coagulopathy (Patrick [name]) . Diabetes mellitus type II, uncontrolled (Patrick [name]) . Acute kidney injury (Patrick [name]) . Bilateral pulmonary embolism (Patrick [name]) . (Resolved) Iron deficiency anemia . Chronic atrial fibrillation (Patrick [name]) . Essential hypertension . Iron deficiency anemia secondary to blood loss (chronic) . Dehydration with hyponatremia . Pulmonary embolism ([place])  I have reviewed the medical record, interviewed the patient and family, and examined the patient. The following aspects are pertinent.  Past Medical History  Diagnosis Date  . High blood pressure   . Dyslipidemia   . Persistent atrial fibrillation (Patrick [name]) 01/14/2009    afib--cardioversion successful x1 shock; recurrent A. fib  . TxN1 adenocarcinoma of the gastric antrum  11/19/2013  . Type II diabetes mellitus ([place])   . Anemia   . History of blood transfusion 12/2014; 01/12/2015    "related to low HgB; related to low HgB" (01/12/2015)  . Upper GI bleed 12/2014; 01/12/2015  . Stroke Patrick [name]) 2009    denies residual on 01/12/2015   Social History   Social History  . Marital Status: Widowed    Spouse Name: N/A  . Number of Children: N/A  . Years of Education: N/A   Social History Main Topics  . Smoking status: Former Smoker -- 1.00 packs/day for 45 years    Types: Cigarettes    Quit date: 11/21/2008  . Smokeless tobacco: Never Used  . Alcohol Use: Yes     Comment: 01/12/2015 "quit in 2009"  . Drug Use: No  . Sexual Activity: Yes   Other Topics Concern  . None   Social History Narrative    He is a widower. He has 10 children, 2 grandchildren. Does not smoke, does not drink   alcohol. He walks about everywhere he goes and that is his main mode of transportation, and he uses a cane.   He quit smoking in ~2011.    No family history on file. Scheduled Meds: . antiseptic oral rinse  7 mL Mouth Rinse q12n4p  . chlorhexidine  15 mL Mouth Rinse BID  . ferrous sulfate  325 mg Oral BID WC  . folic acid  1 mg Intravenous Daily  . insulin aspart  0-9 Units Subcutaneous Q4H  . sodium chloride flush  3 mL Intravenous Q12H  . thiamine  100 mg Oral Daily   Continuous Infusions: . sodium chloride 75 mL (07/19/15 0837)  . heparin 1,150 Units/hr (07/19/15 0400)  . phenylephrine (NEO-SYNEPHRINE) Adult infusion Stopped (07/18/15 1230)   PRN Meds:.acetaminophen **OR** acetaminophen, antiseptic oral rinse, metoprolol, prochlorperazine Medications Prior to Admission:  Prior to Admission medications   Medication Sig Start Date End Date Taking? Authorizing Provider  aspirin 81 MG EC tablet Take 81 mg by mouth at bedtime.    Yes Historical Provider, MD  atorvastatin (LIPITOR) 10 MG tablet Take 10 mg by mouth daily.     Yes Historical Provider, MD  capecitabine  (XELODA) 500 MG tablet Take 2 tablets by mouth twice daily after meals for 2 weeks on, then one week off. 07/02/15  Yes Wyatt Portela, MD  carvedilol (COREG) 3.125 MG tablet Take 1 tablet (3.125 mg total) by mouth 2 (two) times daily with a meal. 12/24/14  Yes Allie Bossier, MD  DILT-XR 180 MG 24 hr capsule Take 1 capsule (180 mg total) by mouth daily. 06/09/15  Yes Leonie Man, MD  ferrous sulfate  325 (65 FE) MG tablet Take 325 mg by mouth 2 (two) times daily with a meal.  09/26/14  Yes Historical Provider, MD  furosemide (LASIX) 20 MG tablet Take 1 tablet by mouth daily. 01/26/15  Yes Historical Provider, MD  glipiZIDE (GLUCOTROL XL) 5 MG 24 hr tablet Take 5 mg by mouth 2 (two) times daily.   Yes Historical Provider, MD  lisinopril-hydrochlorothiazide (PRINZIDE,ZESTORETIC) 20-12.5 MG tablet Take 1 tablet by mouth daily.  11/02/14  Yes Historical Provider, MD  loperamide (IMODIUM A-D) 2 MG tablet Take 2 mg by mouth 4 (four) times daily as needed for diarrhea or loose stools.   Yes Historical Provider, MD  metFORMIN (GLUCOPHAGE) 1000 MG tablet Take 1,000 mg by mouth 2 (two) times daily with a meal.  08/19/14  Yes Historical Provider, MD  pantoprazole (PROTONIX) 40 MG tablet Take 1 tablet (40 mg total) by mouth daily. 01/15/15  Yes Shanker Kristeen Mans, MD   No Known Allergies Review of Systems:  Endorses weight loss, bilateral shoulder pain, and thirst.  No longer having SOB.  +weakness and fatigue.  Physical Exam  Well developed thin male with mild right sided facial droop, N/G in place. CV tachycardic, no M/R/G Resp CTA, no increased work of breathing Abdomen  Quiet.  Soft, Patient complains of tenderness to palpation. Extremities able to move all 4  Vital Signs: BP 102/63 mmHg  Pulse 110  Temp(Src) 98.4 F (36.9 C) (Oral)  Resp 19  Ht 6' (1.829 m)  Wt 83.7 kg (184 lb 8.4 oz)  BMI 25.02 kg/m2  SpO2 99% Pain Assessment: 0-10   Pain Score: 0-No pain   SpO2: SpO2: 99 % O2 Device:SpO2:  99 % O2 Flow Rate: .O2 Flow Rate (L/min): 2 L/min  IO: Intake/output summary:   Intake/Output Summary (Last 24 hours) at 07/19/15 1038 Last data filed at 07/19/15 0700  Gross per 24 hour  Intake 2614.1 ml  Output   1651 ml  Net  963.1 ml    LBM: Last BM Date: 07/18/15 Baseline Weight: Weight: 76.204 kg (168 lb) Most recent weight: Weight: 83.7 kg (184 lb 8.4 oz)     Palliative Assessment/Data:   Flowsheet Rows        Most Recent Value   Intake Tab    Referral Department  Critical care   Unit at Time of Referral  Med/Surg Unit   Palliative Care Primary Diagnosis  Cancer   Date Notified  07/18/15   Palliative Care Type  New Palliative care   Reason for referral  Clarify Goals of Care   Date of Admission  07/17/15   Date first seen by Palliative Care  07/19/15   # of days Palliative referral response time  1 Day(s)   # of days IP prior to Palliative referral  1   Clinical Assessment    Palliative Performance Scale Score  60%   Psychosocial & Spiritual Assessment    Palliative Care Outcomes       Time In: 9:25                 Time Out: 10:35 Time Total: 70 min. Greater than 50%  of this time was spent counseling and coordinating care related to the above assessment and plan.  Signed by: Melton Alar, PA-C   Please contact Palliative Medicine Team phone at [phone] for questions and concerns.  For individual provider: See Shea Evans

## 2015-07-19 NOTE — Care Management Note (Signed)
Case Management Note  Patient Details  Name: Patrick [name]. MRN: [id number] Date of Birth: 1946-02-27  Subjective/Objective:    Pt lives with son, states that he can provide assistance "if I tell him what to do."  Palliative Care consult pending.  CM and CSW will follow.                       Expected Discharge Plan:  [place]  Discharge planning Services  CM Consult  Status of Service:  In process, will continue to follow  Girard Cooter, RN 07/19/2015, 10:58 AM

## 2015-07-19 NOTE — Progress Notes (Signed)
   07/19/15 1500  Clinical Encounter Type  Visited With Patient  Visit Type Spiritual support  Referral From Nurse  Spiritual Encounters  Spiritual Needs Prayer  Stress Factors  Patient Stress Factors Health changes  Chaplain stopped by to see patient at suggestion of nurse. Patient requested prayer but did not seem to want conversation. He was keeping his sense of humor, however. Kiaja Shorty, Chaplain

## 2015-07-19 NOTE — Progress Notes (Signed)
Mr. Hollander is c/o feeling nauseous and of his belly feeling full and bloated.  Antiemetics not helping.  NG hooked back up to suction with 700 ml out.  I will cont to monitor.

## 2015-07-20 ENCOUNTER — Encounter (HOSPITAL_COMMUNITY): Payer: Self-pay | Admitting: *Deleted

## 2015-07-20 DIAGNOSIS — D649 Anemia, unspecified: Secondary | ICD-10-CM

## 2015-07-20 DIAGNOSIS — Z7189 Other specified counseling: Secondary | ICD-10-CM | POA: Diagnosis present

## 2015-07-20 LAB — CBC
HCT: 25.8 % — ABNORMAL LOW (ref 39.0–52.0)
Hemoglobin: 8.3 g/dL — ABNORMAL LOW (ref 13.0–17.0)
MCH: 29.5 pg (ref 26.0–34.0)
MCHC: 32.2 g/dL (ref 30.0–36.0)
MCV: 91.8 fL (ref 78.0–100.0)
PLATELETS: 298 10*3/uL (ref 150–400)
RBC: 2.81 MIL/uL — ABNORMAL LOW (ref 4.22–5.81)
RDW: 22.2 % — ABNORMAL HIGH (ref 11.5–15.5)
WBC: 7.1 10*3/uL (ref 4.0–10.5)

## 2015-07-20 LAB — GLUCOSE, CAPILLARY
GLUCOSE-CAPILLARY: 144 mg/dL — AB (ref 65–99)
Glucose-Capillary: 107 mg/dL — ABNORMAL HIGH (ref 65–99)
Glucose-Capillary: 127 mg/dL — ABNORMAL HIGH (ref 65–99)
Glucose-Capillary: 130 mg/dL — ABNORMAL HIGH (ref 65–99)
Glucose-Capillary: 151 mg/dL — ABNORMAL HIGH (ref 65–99)
Glucose-Capillary: 172 mg/dL — ABNORMAL HIGH (ref 65–99)

## 2015-07-20 LAB — HEPARIN LEVEL (UNFRACTIONATED): HEPARIN UNFRACTIONATED: 0.42 [IU]/mL (ref 0.30–0.70)

## 2015-07-20 NOTE — Progress Notes (Signed)
   07/20/15 1300  Clinical Encounter Type  Visited With Patient  Visit Type Spiritual support  Referral From Nurse  Spiritual Encounters  Spiritual Needs Literature  Stress Factors  Patient Stress Factors Health changes;Major life changes  Prepared and completed AD in two visits.

## 2015-07-20 NOTE — Consult Note (Signed)
Chief Complaint: Patient was seen in consultation today for IVC filter placement Chief Complaint  Patient presents with  . Shortness of Breath    Referring Physician(s): Corrie Dandy, Jens Som  Supervising Physician: Arne Cleveland  Patient Status: In-pt /  History of Present Illness: Patrick [name]. is a 70 y.o. male with past medical history significant for hypertension, dyslipidemia, atrial fibrillation, diabetes, prior upper GI bleed in 2016, stroke in 2009, progressive gastric cancer with previous radiation therapy and now on palliative xeloda. He was admitted to the hospital on 5/13 with dyspnea and hypotension and subsequently found to have bilateral PE. Marland Kitchen He was also noted to have a superficial thrombosis of the perforator vein mid calf left lower extremity. He is deconditioned and now a DO NOT RESUSCITATE. He was previously on IV heparin drip but since discontinued due to recent melena/hgb drop. Request now received from critical care for IVC filter placement.  Past Medical History  Diagnosis Date  . High blood pressure   . Dyslipidemia   . Persistent atrial fibrillation ([place]) 01/14/2009    afib--cardioversion successful x1 shock; recurrent A. fib  . 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 Type II diabetes mellitus ([place])   . Anemia   . History of blood transfusion 12/2014; 01/12/2015    "related to low HgB; related to low HgB" (01/12/2015)  . Upper GI bleed 12/2014; 01/12/2015  . Stroke [facility]) 2009    denies residual on 01/12/2015    Past Surgical History  Procedure Laterality Date  . Cardiac catheterization  01/15/2009    LV 40%, no occlusive coronary disease,norenal artery stenosis or abdmoninal aotric aneurysm  . Doppler echocardiography  10 /2012    EF normal -probably elevated left atrial pressures unable to asses due to afib.;mildly scelrotic aortic valve but  no stenosis and mildlyelevated right atrial  pressures and pulm. pressures at 45-50 mmhg  with mild to mod pulm  htn no change from 2011  . Cataract extraction w/ intraocular lens  implant, bilateral  2014  . Biopsy stomach  10/21/13    [id number]  . Inguinal hernia repair Bilateral   . Esophagogastroduodenoscopy N/A 01/14/2015    Procedure: ESOPHAGOGASTRODUODENOSCOPY (EGD);  Surgeon: Carol Ada, MD;  Location: [facility] ENDOSCOPY;  Service: Endoscopy;  Laterality: N/A;    Allergies: Review of patient's allergies indicates no known allergies.  Medications: Prior to Admission medications   Medication Sig Start Date End Date Taking? Authorizing Provider  aspirin 81 MG EC tablet Take 81 mg by mouth at bedtime.    Yes Historical Provider, MD  atorvastatin (LIPITOR) 10 MG tablet Take 10 mg by mouth daily.     Yes Historical Provider, MD  capecitabine (XELODA) 500 MG tablet Take 2 tablets by mouth twice daily after meals for 2 weeks on, then one week off. 07/02/15  Yes Wyatt Portela, MD  carvedilol (COREG) 3.125 MG tablet Take 1 tablet (3.125 mg total) by mouth 2 (two) times daily with a meal. 12/24/14  Yes Allie Bossier, MD  DILT-XR 180 MG 24 hr capsule Take 1 capsule (180 mg total) by mouth daily. 06/09/15  Yes Leonie Man, MD  ferrous sulfate 325 (65 FE) MG tablet Take 325 mg by mouth 2 (two) times daily with a meal.  09/26/14  Yes Historical Provider, MD  furosemide (LASIX) 20 MG tablet Take 1 tablet by mouth daily. 01/26/15  Yes Historical Provider, MD  glipiZIDE (GLUCOTROL XL) 5 MG 24 hr tablet  Take 5 mg by mouth 2 (two) times daily.   Yes Historical Provider, MD  lisinopril-hydrochlorothiazide (PRINZIDE,ZESTORETIC) 20-12.5 MG tablet Take 1 tablet by mouth daily.  11/02/14  Yes Historical Provider, MD  loperamide (IMODIUM A-D) 2 MG tablet Take 2 mg by mouth 4 (four) times daily as needed for diarrhea or loose stools.   Yes Historical Provider, MD  metFORMIN (GLUCOPHAGE) 1000 MG tablet Take 1,000 mg by mouth 2 (two) times daily with a meal.  08/19/14  Yes Historical Provider, MD    pantoprazole (PROTONIX) 40 MG tablet Take 1 tablet (40 mg total) by mouth daily. 01/15/15  Yes Shanker Kristeen Mans, MD     History reviewed. No pertinent family history.  Social History   Social History  . Marital Status: Widowed    Spouse Name: N/A  . Number of Children: N/A  . Years of Education: N/A   Social History Main Topics  . Smoking status: Former Smoker -- 1.00 packs/day for 45 years    Types: Cigarettes    Quit date: 11/21/2008  . Smokeless tobacco: Never Used  . Alcohol Use: Yes     Comment: 01/12/2015 "quit in 2009"  . Drug Use: No  . Sexual Activity: Yes   Other Topics Concern  . None   Social History Narrative    He is a widower. He has 10 children, 2 grandchildren. Does not smoke, does not drink   alcohol. He walks about everywhere he goes and that is his main mode of transportation, and he uses a cane.   He quit smoking in ~2011.       Review of Systems see above. Patient currently denies fever, headache, chest pain, abdominal/back pain, nausea /vomiting.  Vital Signs: BP 114/73 mmHg  Pulse 77  Temp(Src) 97.1 F (36.2 C) (Oral)  Resp 22  Ht 6' (1.829 m)  Wt 186 lb 4.6 oz (84.5 kg)  BMI 25.26 kg/m2  SpO2 100%  Physical Exam  patient awake, alert, frail appearing, NG tube in place; chest clear to auscultation bilaterally.Marland Kitchen Heart irregularly irregular; soft, positive bowel sounds, mild distention ,mild diffuse tenderness. Trace lower extremity edema. Mallampati Score:     Imaging: Ct Angio Chest Pe W/cm &/or Wo Cm  07/17/2015  CLINICAL DATA:  Shortness of breath and abdominal pain after train ride. Evaluate for pulmonary embolus. EXAM: CT ANGIOGRAPHY CHEST WITH CONTRAST TECHNIQUE: Multidetector CT imaging of the chest was performed using the standard protocol during bolus administration of intravenous contrast. Multiplanar CT image reconstructions and MIPs were obtained to evaluate the vascular anatomy. CONTRAST:  75 cc of Isovue 370 COMPARISON:   PET-CT March 31, 2015 FINDINGS: Bilateral pulmonary emboli are seen involving the bilateral upper, right middle, and bilateral lower lobes. The pulmonary embolus burden is moderate. The thoracic aorta is not well assessed due to timing of contrast but no aneurysm or dissection is seen. Coronary artery calcifications are identified. The esophagus is fluid filled. There is a lymph node in the epicardial fat anterior to the liver on series 4, image 99 which measures 2 cm in greatest dimension versus 10 mm previously. This is much larger in the interval. The subcarinal node may be mildly more prominent. No other adenopathy seen in the chest. There is a tiny right pleural effusion. No left pleural or pericardial effusion. Cardiomegaly persists. The central airways are stable. No pneumothorax. No new pulmonary nodules, masses, or infiltrates are identified. The stomach is distended with fluid and air. Marked thickening of the gastric antrum  and distal body is again identified. Adenopathy again seen anterior to the gastric antrum. No other acute abnormalities are seen within the abdomen. There is a lymph node just medial to the stomach on image 117 which is new in the interval. No bony metastatic disease is seen. Review of the MIP images confirms the above findings. IMPRESSION: 1. Bilateral pulmonary emboli identified with a moderate burden. 2. There is an enlarging node in the epicardial fat anterior to the liver. Findings of known gastric cancer seen in the distal gastric body and antrum with worsening nodes in the upper abdomen. 3. Gastric distention suggesting at least partial gastric outlet obstruction. Findings called to Dr. Lita Mains Electronically Signed   By: Dorise Bullion III M.D   On: 07/17/2015 08:07   Dg Chest Port 1 View  07/17/2015  CLINICAL DATA:  Dyspnea today EXAM: PORTABLE CHEST 1 VIEW COMPARISON:  12/20/2014 FINDINGS: A single AP portable view of the chest demonstrates no focal airspace  consolidation or alveolar edema. The lungs are grossly clear. There is no large effusion or pneumothorax. Cardiac and mediastinal contours appear unremarkable. IMPRESSION: No active disease. Electronically Signed   By: Andreas Newport M.D.   On: 07/17/2015 05:39    Labs:  CBC:  Recent Labs  07/17/15 1600 07/18/15 0215 07/19/15 0400 07/19/15 0940 07/19/15 1740 07/19/15 2345 07/20/15 0555  WBC 5.6 5.6 5.3  --   --   --  7.1  HGB 8.5* 8.6* 8.0* 8.2* 8.7* 8.5* 8.3*  HCT 25.4* 25.5* 24.7* 24.4* 26.3* 26.1* 25.8*  PLT 363 318 290  --   --   --  298    COAGS:  Recent Labs  12/20/14 1605  01/12/15 1730 01/13/15 0541 07/17/15 0500 07/17/15 0723 07/17/15 1034  INR 1.20  < > 1.17 1.06 5.81*  --  1.24  APTT 32  --  33  --  48* 83*  --   < > = values in this interval not displayed.  BMP:  Recent Labs  07/17/15 0500 07/17/15 0518 07/17/15 1034 07/18/15 0215 07/19/15 0400  NA 129* 129* 131* 135 137  K 4.9 4.9 4.4 3.9 4.1  CL 95* 96* 106 110 113*  CO2 17*  --  10* 14* 15*  GLUCOSE 213* 207* 157* 118* 156*  BUN 97* 90* 82* 48* 27*  CALCIUM 8.7*  --  7.0* 7.8* 7.9*  CREATININE 2.47* 2.50* 1.99* 1.30* 1.11  GFRNONAA 25*  --  32* 54* >60  GFRAA 29*  --  37* >60 >60    LIVER FUNCTION TESTS:  Recent Labs  05/14/15 1401 06/17/15 1244 07/17/15 0500 07/18/15 0215  BILITOT 0.30 <0.30 0.7 1.3*  AST 15 15 25 27   ALT <9 <9 13* 13*  ALKPHOS 79 75 57 47  PROT 7.2 7.3 6.6 5.0*  ALBUMIN 3.6 3.6 3.2* 2.3*    TUMOR MARKERS: No results for input(s): AFPTM, CEA, CA199, CHROMGRNA in the last 8760 hours.  Assessment and Plan: 70 y.o. male with past medical history significant for hypertension, dyslipidemia, atrial fibrillation, diabetes, prior upper GI bleed in 2016, stroke in 2009, progressive gastric cancer with previous radiation therapy and now on palliative xeloda. He was admitted to the hospital on 5/13 with dyspnea and hypotension and subsequently found to have  bilateral PE.  He was also noted to have a superficial thrombosis of the perforator vein mid calf left lower extremity. He is deconditioned and now a DO NOT RESUSCITATE. He was previously on IV heparin drip but  since discontinued due to recent melena/hgb drop. Request now received from critical care for IVC filter placement. Current labs include WBC 7.1, hemoglobin 8.3, platelets 298K, creatinine 1.11, PT 15.7, INR 1.24. Case and imaging studies were reviewed by Dr. Annamaria Boots and findings discussed with Dr. Corrie Dandy. IVC filter placement tent scheduled 5/17. Risks and benefits discussed with the patient/POA Patrick [name] including, but not limited to bleeding, infection, contrast induced renal failure, filter fracture or migration which can lead to emergency surgery or even death, strut penetration with damage or irritation to adjacent structures and caval thrombosis. All of the patient's questions were answered, patient is agreeable to proceed.Consent signed and in chart.      Thank you for this interesting consult.  I greatly enjoyed meeting Patrick [name]. and look forward to participating in their care.  A copy of this report was sent to the requesting provider on this date.  Electronically Signed: D. Rowe Robert 07/20/2015, 2:26 PM   I spent a total of 30 minutes in face to face in clinical consultation, greater than 50% of which was counseling/coordinating care for IVC filter placement

## 2015-07-20 NOTE — Care Management Important Message (Signed)
Important Message  Patient Details  Name: Patrick [name]. MRN: [id number] Date of Birth: 1945/11/05   Medicare Important Message Given:       Girard Cooter, RN 07/20/2015, 10:42 AM

## 2015-07-20 NOTE — Plan of Care (Signed)
Problem: Bowel/Gastric: Goal: Will show no signs and symptoms of gastrointestinal bleeding Outcome: Not Progressing Pt's stools black/tarry.

## 2015-07-20 NOTE — Progress Notes (Addendum)
PULMONARY / CRITICAL CARE MEDICINE   Name: Patrick [name]. MRN: [id number] DOB: 25-Mar-1945    ADMISSION DATE:  07/17/2015 CONSULTATION DATE:  5/13  REFERRING MD:  [facility]   CHIEF COMPLAINT:  Hypotension   HISTORY OF PRESENT ILLNESS:  70yo male with hx invasive adenocarcinoma of the stomach s/p xrt on palliative Xeloda (progression on PET in 03/2015), HTN, DM, chronic anemia, AFib (not on anticoagulation r/t anemia) presented 5/13 with 2 day hx increasing SOB suddenly worse the am of admission.  In ER pt was hypotensive requiring pressors, down trending hgb, echo revealed evidence of RV strain and CTA chest revealed bilat PE and PCCM consulted.   Last radiation was last year.     SUBJECTIVE:  Remains off neo gtt No Nausea /abd pain, NGT to suction > bilious with sedement Tachycardiac/ atrial fib Comfortable With black stools x 3.  VITAL SIGNS: BP 117/62 mmHg  Pulse 114  Temp(Src) 97.4 F (36.3 C) (Oral)  Resp 26  Ht 6' (1.829 m)  Wt 186 lb 4.6 oz (84.5 kg)  BMI 25.26 kg/m2  SpO2 99%  HEMODYNAMICS:    VENTILATOR SETTINGS:    INTAKE / OUTPUT: I/O last 3 completed shifts: In: 3188.8 [P.O.:90; I.V.:2848.8; NG/GT:150; IV Piggyback:100] Out: 2502 [Urine:1700; Emesis/NG output:800; Stool:2]  PHYSICAL EXAMINATION: General:  Pleasant, frail, chronically ill appearing male, NAD  Neuro:  Awake, alert, appropriate, MAE, gen weakness  HEENT:  Mm dry, no JVD , NG bilious with sediment Cardiovascular:  s1s2 irreg  Lungs:  resps even non labored on [place], bibasilar crackles Abdomen:  Round, mildly distended, mildly tender, hypoactive bs   Musculoskeletal:  Warm and dry, scant BLE edema, symmetric BLE    LABS:  BMET  Recent Labs Lab 07/17/15 1034 07/18/15 0215 07/19/15 0400  NA 131* 135 137  K 4.4 3.9 4.1  CL 106 110 113*  CO2 10* 14* 15*  BUN 82* 48* 27*  CREATININE 1.99* 1.30* 1.11  GLUCOSE 157* 118* 156*    Electrolytes  Recent Labs Lab 07/17/15 1034  07/17/15 1600 07/18/15 0215 07/19/15 0400  CALCIUM 7.0*  --  7.8* 7.9*  MG  --  2.1  --   --   PHOS  --  4.8*  --   --     CBC  Recent Labs Lab 07/18/15 0215 07/19/15 0400  07/19/15 1740 07/19/15 2345 07/20/15 0555  WBC 5.6 5.3  --   --   --  7.1  HGB 8.6* 8.0*  < > 8.7* 8.5* 8.3*  HCT 25.5* 24.7*  < > 26.3* 26.1* 25.8*  PLT 318 290  --   --   --  298  < > = values in this interval not displayed.  Coag's  Recent Labs Lab 07/17/15 0500 07/17/15 0723 07/17/15 1034  APTT 48* 83*  --   INR 5.81*  --  1.24    Sepsis Markers  Recent Labs Lab 07/17/15 0723 07/17/15 0735 07/17/15 1034 07/17/15 1600 07/18/15 0215 07/19/15 0400  LATICACIDVEN 1.4 1.35 2.0  --   --   --   PROCALCITON  --   --   --  0.38 0.43 0.26    ABG  Recent Labs Lab 07/17/15 1116 07/17/15 1658 07/17/15 1825  PHART 7.342* 7.332* 7.355  PCO2ART 27.7* 24.0* 22.5*  PO2ART 81.0 61.0* 112.0*    Liver Enzymes  Recent Labs Lab 07/17/15 0500 07/18/15 0215  AST 25 27  ALT 13* 13*  ALKPHOS 57 47  BILITOT 0.7 1.3*  ALBUMIN 3.2* 2.3*    Cardiac Enzymes  Recent Labs Lab 07/17/15 1600 07/17/15 2000 07/18/15 0215  TROPONINI 0.06* 0.10* 0.14*    Glucose  Recent Labs Lab 07/19/15 1243 07/19/15 1643 07/19/15 1915 07/20/15 0015 07/20/15 0405 07/20/15 0857  GLUCAP 92 127* 114* 107* 130* 144*    Imaging No results found.   STUDIES:  CTA chest 5/13>>> 1. Bilateral pulmonary emboli identified with a moderate burden. 2. There is an enlarging node in the epicardial fat anterior to the liver. Findings of known gastric cancer seen in the distal gastric body and antrum with worsening nodes in the upper abdomen. 3. Gastric distention suggesting at least partial gastric outlet obstruction  Echo 5/13  flattening of interventricular septum with RVSP 77                   EF: [id number], systolic function severely reduced CULTURES: BCx2 5/13>>> (-) Urine 5/13>>> No  Growth  ANTIBIOTICS: vanc 5/13>>> 5/15 Zosyn 5/13>>> 5/15  SIGNIFICANT EVENTS:   LINES/TUBES: PIV x 2  DISCUSSION: 70yo male with invasive adenocarcinoma of the stomach s/p xrt on current oral chemo and chronic anemia presents 5/13 with dyspnea and shock.  Found to have bilat PE.    ASSESSMENT / PLAN:  PULMONARY Acute BL massive PE    No evidence of DVT per radiology 5/13 Acute resp distress after NGT placement requiring bipap > off bipap since 5/14 P:   bipap prn only Supplemental O2  Pt with GI bleed likely. With black tarry stools, also with anemia requiring transfusion.  Will discontinue heparin drip. Will place IVC filter. D/w pt re: pros and cons of heparin.  Pt was on coumadin before but was stopped 2/2 anemia in oct 2016.    CARDIOVASCULAR Hypotension/shock -- better with IVF, transfusion. Afib. In and out of RVR.  P:  Will give lopressor 2.5 mg IV q 4 prn for HR > 150. Neo has been weaned off. May resume if with BP issues.  Off heparin 2/2 GI bleed.   RENAL Mild metabolic acidosis > better  AKI -- multifactorial r/t hypotension c/b metformin, diuretics, ace-i at home  Hyponatremia  P:   Trend chem  Strict I&O Volume resuscitation > NS 50 mls/hr Try clears. Continue to Hold metformin, lasix, lisinopril   GASTROINTESTINAL Invasive gastric adenocarcinoma  Anemia  Possible gastric outlet obstruction  3 BM's overnight, black ,old blood P:   Try clears if he tolerates.  NGT to LIS  PPI   HEMATOLOGIC Acute on chronic anemia - likely 2/2 GI bleed S/ p PRBC x 2 on 07/17/2015 P:  Hold off on heparin drip 2/2 GI bleed. Discussed with pt and he agrees.    INFECTIOUS Shock -  Related to PE; better P:   Pan culture > (-) so far. Off abx.  Trend WBC/ Fever Curve  ENDOCRINE DM   P:   SSI   NEUROLOGIC No active issue  P:   Continue Supportive care    FAMILY  - Updates:  Pt updated at length 5/15 per Dr. Corrie Dandy.  Pt spoke with palliative care  5/16. He has changed code status to a partial code: No CPR or intubation, but agrees to BiPAP and drugs. Daughter and sister in law are coming from [place] this weekend per the patient. His son does live in town. I think he is wanting them to help with making decisions about further aggressiveness of care.  - Inter-disciplinary family meet or Palliative Care meeting  due by 07/24/2015  Magdalen Spatz, AGACNP-BC [place]  07/20/2015, 11:00 AM  ATTENDING NOTE / ATTESTATION NOTE :   I have discussed the case with the resident/APP Eric Form.  I agree with the resident/APP's  history, physical examination, assessment, and plans.  I have edited the above note and modified it according to our agreed history, physical examination, assessment and plan.   I have spent 31 minutes of critical care time with this patient today.  Family :Family updated at length today.     Monica Becton, MD 07/20/2015, 1:03 PM [facility] Pager [phone] After 3 pm or if no answer, call [phone]

## 2015-07-20 NOTE — Progress Notes (Signed)
ANTICOAGULATION CONSULT NOTE - Follow Up Consult  Pharmacy Consult for Heparin  Indication: pulmonary embolus  No Known Allergies  Patient Measurements: Height: 6' (182.9 cm) Weight: 186 lb 4.6 oz (84.5 kg) IBW/kg (Calculated) : 77.6  Vital Signs: Temp: 97.4 F (36.3 C) (05/16 0800) Temp Source: Oral (05/16 0800) BP: 117/62 mmHg (05/16 1000) Pulse Rate: 114 (05/16 1000)  Labs:  Recent Labs  07/17/15 1600 07/17/15 2000  07/18/15 0215  07/18/15 1728 07/19/15 0400  07/19/15 1740 07/19/15 2345 07/20/15 0545 07/20/15 0555  HGB 8.5*  --   --  8.6*  --   --  8.0*  < > 8.7* 8.5*  --  8.3*  HCT 25.4*  --   --  25.5*  --   --  24.7*  < > 26.3* 26.1*  --  25.8*  PLT 363  --   --  318  --   --  290  --   --   --   --  298  HEPARINUNFRC 0.56  --   < >  --   < > 0.61 0.42  --   --   --  0.42  --   CREATININE  --   --   --  1.30*  --   --  1.11  --   --   --   --   --   TROPONINI 0.06* 0.10*  --  0.14*  --   --   --   --   --   --   --   --   < > = values in this interval not displayed.  Estimated Creatinine Clearance: 67 mL/min (by C-G formula based on Cr of 1.11).  Assessment: Heparin for new onset PE, heparin level remains at goal at 0.42. CBC is stable. Some melena per palliative notes but thought to be related to gastric cancer. Will need to monitor closely.    Goal of Therapy:  Heparin level 0.3-0.7 units/ml Monitor platelets by anticoagulation protocol: Yes   Plan:  -Continue heparin at 1150 units/hr -Daily HL and CBC -Follow long term AC plans - F/u S&S of bleeding  Salome Arnt, PharmD, BCPS Pager # [phone] 07/20/2015 11:15 AM

## 2015-07-20 NOTE — Progress Notes (Signed)
Daily Progress Note   Patient Name: Patrick [name].       Date: 07/20/2015 DOB: 1945/04/06  Age: 70 y.o. MRN#: [id number] Attending Physician: Rigoberto Noel, MD Primary Care Physician: Thressa Sheller, MD Admit Date: 07/17/2015   70 y.o. male with past medical history of invasive adenocarcinoma of the stomach followed by Dr. Alen Blew, DMII, and stoke, who was admitted on 07/17/2015 with SOB, hematemesis and hypotension. He was found to have bilateral pulmonary embolism, and likely partial gastric outlet obstruction.  Reason for Consultation/Follow-up: Establishing goals of care  Subjective:  I feel better.  I had 3 bowel movements.  Patient is more upbeat / energetic today.  He would like to get out of bed to chair.  Requests information about advanced directives.  He would like for his step daughter Pernell Dupre to be his HCPOA.  He indicates that he would not want artificial feeding, intubation or CPR.  Per his step daughter Pernell Dupre he would want aggressive care otherwise including defibrillation.   Length of Stay: 3  Current Medications: Scheduled Meds:  . antiseptic oral rinse  7 mL Mouth Rinse q12n4p  . chlorhexidine  15 mL Mouth Rinse BID  . ferrous sulfate  325 mg Oral BID WC  . folic acid  1 mg Intravenous Daily  . insulin aspart  0-9 Units Subcutaneous Q4H  . sodium chloride flush  3 mL Intravenous Q12H  . thiamine  100 mg Oral Daily    Continuous Infusions: . sodium chloride 75 mL/hr at 07/20/15 0400  . heparin 1,150 Units/hr (07/20/15 0400)  . phenylephrine (NEO-SYNEPHRINE) Adult infusion Stopped (07/18/15 1230)    PRN Meds: acetaminophen **OR** acetaminophen, antiseptic oral rinse, metoprolol, prochlorperazine  Physical Exam         Well developed very pleasant man.   NAD.  N/G in place with very little output CV:  Tachycardic (150s at rest in bed) Resp:  NAD, no pain with inspiration, no w/c/r Abdomen:  Soft, NT, ND Extremities:  5/5 strength in each.  Vital Signs: BP 106/73 mmHg  Pulse 81  Temp(Src) 97.4 F (36.3 C) (Oral)  Resp 20  Ht 6' (1.829 m)  Wt 84.5 kg (186 lb 4.6 oz)  BMI 25.26 kg/m2  SpO2 98% SpO2: SpO2: 98 % O2 Device: O2 Device: Not Delivered O2 Flow Rate: O2 Flow  Rate (L/min): 2 L/min  Intake/output summary:   Intake/Output Summary (Last 24 hours) at 07/20/15 0911 Last data filed at 07/20/15 0800  Gross per 24 hour  Intake   2023 ml  Output   1152 ml  Net    871 ml   LBM: Last BM Date: 07/20/15 Baseline Weight: Weight: 76.204 kg (168 lb) Most recent weight: Weight: 84.5 kg (186 lb 4.6 oz)       Palliative Assessment/Data:    Flowsheet Rows        Most Recent Value   Intake Tab    Referral Department  Critical care   Unit at Time of Referral  Med/Surg Unit   Palliative Care Primary Diagnosis  Cancer   Date Notified  07/18/15   Palliative Care Type  New Palliative care   Reason for referral  Clarify Goals of Care   Date of Admission  07/17/15   Date first seen by Palliative Care  07/19/15   # of days Palliative referral response time  1 Day(s)   # of days IP prior to Palliative referral  1   Clinical Assessment    Palliative Performance Scale Score  60%   Psychosocial & Spiritual Assessment    Palliative Care Outcomes       Patient Active Problem List   Diagnosis Date Noted  . Goals of care, counseling/discussion   . Palliative care encounter   . Partial gastric outlet obstruction   . Hypotension 07/17/2015  . Bilateral pulmonary embolism ([place]) 07/17/2015  . Dehydration with hyponatremia 07/17/2015  . Pulmonary embolism ([place]) 07/17/2015  . QT prolongation   . Anemia associated with acute blood loss 01/12/2015  . Acute kidney injury ([place])   . Upper GI bleed   . Coagulopathy ([place])   . Bleeding  gastrointestinal   . GI bleed 12/20/2014  . Diabetes mellitus type II, non insulin dependent ([place]) 12/20/2014  . Acute blood loss anemia 12/20/2014  . 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 Iron deficiency anemia secondary to blood loss (chronic) 10/30/2013  . 110.1 03/03/2013  . Dyslipidemia, goal LDL below 130 - on statin; followed by PCP 11/21/2012  . Chronic atrial fibrillation ([place]) 05/23/2012  . Essential hypertension 09/28/2010  . Diabetes mellitus type II, uncontrolled ([place]) 09/28/2010  . Stroke [facility]) 09/28/2010    Palliative Care Assessment & Plan    Assessment: Patient with bilateral PEs, Right heart strain and tachycardia.  He is currently having melena - likely due to bleeding from his gastric cancer.  There is a question of partial gastric outlet obstruction.  Last imaging of gastric cancer was 03/31/15 PET which showed progressive disease with increased thickening (63mm) of the distal body and antrum wall and increasing metastatic lymphadenopathy.  He has had a small area of ?mets on left sacrum at S1 since 2015.  Recommendations/Plan:  Family and patient have changed code status to partial.  Planning family meeting for Wed/Thurs when step daughter arrives from [place].  Advanced Directives being discussed.  Chaplain consulted to help complete/notarize   Code Status:  Limited Code:  No CPR, Intubation or artificial feeding.  Otherwise Aggressive care.  Prognosis:   Unable to determine.   Less than 6 months based on progressive gastric cancer.  Possibly significantly less if he continues to bleed, clot or is obstructed.  Discharge Planning:  To Be Determined.  Likely to home.  Possibly with hospice services.  Care plan was discussed with Patient's step daughter, Chauncey Reading  Thank  you for allowing the Palliative Medicine Team to assist in the care of this patient.   Time In: 8:30 Time Out: 9:05 Total Time 35 MIN Prolonged Time Billed NO        Greater than 50%  of this time was spent counseling and coordinating care related to the above assessment and plan.  Melton Alar, PA-C  Please contact Palliative Medicine Team phone at [phone] for questions and concerns.

## 2015-07-21 ENCOUNTER — Inpatient Hospital Stay (HOSPITAL_COMMUNITY): Payer: Medicare Other

## 2015-07-21 LAB — BASIC METABOLIC PANEL
Anion gap: 9 (ref 5–15)
BUN: 31 mg/dL — AB (ref 6–20)
CALCIUM: 8.4 mg/dL — AB (ref 8.9–10.3)
CO2: 17 mmol/L — AB (ref 22–32)
CREATININE: 1.03 mg/dL (ref 0.61–1.24)
Chloride: 116 mmol/L — ABNORMAL HIGH (ref 101–111)
GFR calc Af Amer: >60 mL/min (ref >60)
GFR calc non Af Amer: >60 mL/min (ref >60)
GLUCOSE: 128 mg/dL — AB (ref 65–99)
Potassium: 4.6 mmol/L (ref 3.5–5.1)
Sodium: 142 mmol/L (ref 135–145)

## 2015-07-21 LAB — TYPE AND SCREEN
ABO/RH(D): A POS
ANTIBODY SCREEN: NEGATIVE
UNIT DIVISION: 0
UNIT DIVISION: 0
UNIT DIVISION: 0

## 2015-07-21 LAB — GLUCOSE, CAPILLARY
GLUCOSE-CAPILLARY: 130 mg/dL — AB (ref 65–99)
GLUCOSE-CAPILLARY: 137 mg/dL — AB (ref 65–99)
GLUCOSE-CAPILLARY: 138 mg/dL — AB (ref 65–99)
Glucose-Capillary: 126 mg/dL — ABNORMAL HIGH (ref 65–99)
Glucose-Capillary: 149 mg/dL — ABNORMAL HIGH (ref 65–99)
Glucose-Capillary: 144 mg/dL — ABNORMAL HIGH (ref 65–99)
Glucose-Capillary: 96 mg/dL (ref 65–99)

## 2015-07-21 LAB — CBC
HEMATOCRIT: 29.6 % — AB (ref 39.0–52.0)
Hemoglobin: 9.5 g/dL — ABNORMAL LOW (ref 13.0–17.0)
MCH: 29.5 pg (ref 26.0–34.0)
MCHC: 32.1 g/dL (ref 30.0–36.0)
MCV: 91.9 fL (ref 78.0–100.0)
PLATELETS: 327 10*3/uL (ref 150–400)
RBC: 3.22 MIL/uL — ABNORMAL LOW (ref 4.22–5.81)
RDW: 22.4 % — AB (ref 11.5–15.5)
WBC: 8 10*3/uL (ref 4.0–10.5)

## 2015-07-21 LAB — PROTIME-INR
INR: 1.29 (ref 0.00–1.49)
Prothrombin Time: 16.3 seconds — ABNORMAL HIGH (ref 11.6–15.2)

## 2015-07-21 MED ORDER — IOPAMIDOL (ISOVUE-300) INJECTION 61%
INTRAVENOUS | Status: AC
  Administered 2015-07-21: 45 mL
  Filled 2015-07-21: qty 100

## 2015-07-21 MED ORDER — LIDOCAINE HCL 1 % IJ SOLN
INTRAMUSCULAR | Status: AC
  Administered 2015-07-21: 10 mL
  Filled 2015-07-21: qty 20

## 2015-07-21 MED ORDER — FENTANYL CITRATE (PF) 100 MCG/2ML IJ SOLN
INTRAMUSCULAR | Status: AC | PRN
  Administered 2015-07-21: 50 ug via INTRAVENOUS

## 2015-07-21 MED ORDER — FENTANYL CITRATE (PF) 100 MCG/2ML IJ SOLN
INTRAMUSCULAR | Status: AC
  Filled 2015-07-21: qty 2

## 2015-07-21 MED ORDER — MIDAZOLAM HCL 2 MG/2ML IJ SOLN
INTRAMUSCULAR | Status: AC | PRN
  Administered 2015-07-21: 1 mg via INTRAVENOUS

## 2015-07-21 MED ORDER — METOPROLOL TARTRATE 5 MG/5ML IV SOLN
2.5000 mg | INTRAVENOUS | Status: DC | PRN
  Administered 2015-07-21 – 2015-07-22 (×2): 2.5 mg via INTRAVENOUS
  Filled 2015-07-21 (×2): qty 5

## 2015-07-21 MED ORDER — MIDAZOLAM HCL 2 MG/2ML IJ SOLN
INTRAMUSCULAR | Status: AC
  Filled 2015-07-21: qty 2

## 2015-07-21 NOTE — Progress Notes (Signed)
Daily Progress Note   Patient Name: Patrick [name].       Date: 07/21/2015 DOB: 1946/01/23  Age: 70 y.o. MRN#: [id number] Attending Physician: Rigoberto Noel, MD Primary Care Physician: Thressa Sheller, MD Admit Date: 07/17/2015   70 y.o. male with past medical history of invasive adenocarcinoma of the stomach followed by Dr. Alen Blew, DMII, and stoke, who was admitted on 07/17/2015 with SOB, hematemesis and hypotension. He was found to have bilateral pulmonary embolism, and likely partial gastric outlet obstruction.  Reason for Consultation/Follow-up: Establishing goals of care  Subjective:  Patient is not feeling as well today.  Has been NPO since midnight pending IVC filter placement and he is complaining of being thirsty.    Length of Stay: 4  Current Medications: Scheduled Meds:  . antiseptic oral rinse  7 mL Mouth Rinse q12n4p  . chlorhexidine  15 mL Mouth Rinse BID  . ferrous sulfate  325 mg Oral BID WC  . folic acid  1 mg Intravenous Daily  . insulin aspart  0-9 Units Subcutaneous Q4H  . sodium chloride flush  3 mL Intravenous Q12H  . thiamine  100 mg Oral Daily    Continuous Infusions: . sodium chloride 50 mL/hr at 07/20/15 2000  . phenylephrine (NEO-SYNEPHRINE) Adult infusion Stopped (07/18/15 1230)    PRN Meds: acetaminophen **OR** acetaminophen, antiseptic oral rinse, metoprolol, prochlorperazine  Physical Exam         Well developed very pleasant man.  NAD.  N/G in place with very little output CV:  Tachycardic (150s at rest in bed) Resp:  NAD, no pain with inspiration, no w/c/r Abdomen:  Soft, NT, ND Extremities:  5/5 strength in each.  Vital Signs: BP 110/76 mmHg  Pulse 38  Temp(Src) 97.5 F (36.4 C) (Oral)  Resp 20  Ht 6' (1.829 m)  Wt 78.3 kg (172 lb  9.9 oz)  BMI 23.41 kg/m2  SpO2 100% SpO2: SpO2: 100 % O2 Device: O2 Device: Not Delivered O2 Flow Rate: O2 Flow Rate (L/min): 2 L/min  Intake/output summary:   Intake/Output Summary (Last 24 hours) at 07/21/15 1126 Last data filed at 07/21/15 0900  Gross per 24 hour  Intake 2329.71 ml  Output    750 ml  Net 1579.71 ml   LBM: Last BM Date: 07/20/15 Baseline Weight: Weight: 76.204 kg (168  lb) Most recent weight: Weight: 78.3 kg (172 lb 9.9 oz)       Palliative Assessment/Data:    Flowsheet Rows        Most Recent Value   Intake Tab    Referral Department  Critical care   Unit at Time of Referral  Med/Surg Unit   Palliative Care Primary Diagnosis  Cancer   Date Notified  07/18/15   Palliative Care Type  New Palliative care   Reason for referral  Clarify Goals of Care   Date of Admission  07/17/15   Date first seen by Palliative Care  07/19/15   # of days Palliative referral response time  1 Day(s)   # of days IP prior to Palliative referral  1   Clinical Assessment    Palliative Performance Scale Score  60%   Psychosocial & Spiritual Assessment    Palliative Care Outcomes    Patient/Family meeting held?  Yes   Who was at the meeting?  PATIENT, AND STEP DTR BY PHONE   Palliative Care Outcomes  Provided advance care planning, Changed CPR status   Patient/Family wishes: Interventions discontinued/not started   Mechanical Ventilation, PEG, Tube feedings/TPN      Patient Active Problem List   Diagnosis Date Noted  . Advanced directives, counseling/discussion   . Absolute anemia   . Goals of care, counseling/discussion   . Palliative care encounter   . Partial gastric outlet obstruction   . Hypotension 07/17/2015  . Bilateral pulmonary embolism ([place]) 07/17/2015  . Dehydration with hyponatremia 07/17/2015  . Pulmonary embolism ([place]) 07/17/2015  . QT prolongation   . Anemia associated with acute blood loss 01/12/2015  . Acute kidney injury ([place])   . Upper GI bleed    . Coagulopathy ([place])   . Bleeding gastrointestinal   . GI bleed 12/20/2014  . Diabetes mellitus type II, non insulin dependent ([place]) 12/20/2014  . Acute blood loss anemia 12/20/2014  . 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 Iron deficiency anemia secondary to blood loss (chronic) 10/30/2013  . 110.1 03/03/2013  . Dyslipidemia, goal LDL below 130 - on statin; followed by PCP 11/21/2012  . Chronic atrial fibrillation ([place]) 05/23/2012  . Essential hypertension 09/28/2010  . Diabetes mellitus type II, uncontrolled ([facility]) 09/28/2010  . Stroke [facility]) 09/28/2010    Palliative Care Assessment & Plan    Assessment: Patient with bilateral PEs, Right heart strain and tachycardia.  He is currently having melena - likely due to bleeding from his gastric cancer.  There is a question of partial gastric outlet obstruction.  Last imaging of gastric cancer was 03/31/15 PET which showed progressive disease with increased thickening (36mm) of the distal body and antrum wall and increasing metastatic lymphadenopathy.  He has had a small area of ?mets on left sacrum at S1 since 2015.  Recommendations/Plan:  Family and patient have changed code status to partial.  Planning family meeting for Wed/Thurs when step daughter arrives from [place].  Living Will / HCPOA completed.  Greatly appreciate Chaplain Service's assistance.   Code Status:  Limited Code:  No CPR, Intubation or artificial feeding.  Otherwise Aggressive care.  Prognosis:   Unable to determine.   Less than 6 months based on progressive gastric cancer.  Possibly significantly less if he continues to bleed, clot or is obstructed.  Discharge Planning:  To Be Determined.  Likely to home.  Possibly with hospice services.  Care plan was discussed with Patient's step daughter, Patrick [name]  Thank  you for allowing the Palliative Medicine Team to assist in the care of this patient.   Time In: 11:30 Time Out: 11:55 Total Time 25  Prolonged Time Billed NO      Greater than 50%  of this time was spent counseling and coordinating care related to the above assessment and plan.  Melton Alar, PA-C  Please contact Palliative Medicine Team phone at [phone] for questions and concerns.

## 2015-07-21 NOTE — Procedures (Signed)
IVCgram: negative IVC filter placed, infrarenal, retrievable No complication No blood loss. See complete dictation in [facility] PACS  

## 2015-07-21 NOTE — Progress Notes (Signed)
PULMONARY / CRITICAL CARE MEDICINE   Name: Patrick [name]. MRN: [id number] DOB: 06-23-1945    ADMISSION DATE:  07/17/2015 CONSULTATION DATE:  5/13  REFERRING MD:  [facility]   CHIEF COMPLAINT:  Hypotension   HISTORY OF PRESENT ILLNESS:  70yo male with hx invasive adenocarcinoma of the stomach s/p xrt on palliative Xeloda (progression on PET in 03/2015), HTN, DM, chronic anemia, AFib (not on anticoagulation r/t anemia) presented 5/13 with 2 day hx increasing SOB suddenly worse the am of admission.  In ER pt was hypotensive requiring pressors, down trending hgb, echo revealed evidence of RV strain and CTA chest revealed bilat PE and PCCM consulted.   Last radiation was last year.     SUBJECTIVE:  Remains off neo gtt Tolerated clears 5/16; NPO for IVC filter.  Off heparin drip On and off rapid afib > responding to IV lopressor  VITAL SIGNS: BP 114/79 mmHg  Pulse 95  Temp(Src) 96.4 F (35.8 C) (Axillary)  Resp 22  Ht 6' (1.829 m)  Wt 78.3 kg (172 lb 9.9 oz)  BMI 23.41 kg/m2  SpO2 99%  HEMODYNAMICS:    VENTILATOR SETTINGS:    INTAKE / OUTPUT: I/O last 3 completed shifts: In: 3703.7 [P.O.:1080; I.V.:2503.7; NG/GT:120] Out: 1052 [Urine:1050; Stool:2]  PHYSICAL EXAMINATION: General:  Pleasant, frail, chronically ill appearing male, NAD  Neuro:  Awake, alert, appropriate, MAE, gen weakness  HEENT:  Mm dry, no JVD , NG bilious with sediment Cardiovascular:  s1s2 irreg  Lungs:  resps even non labored on [place], bibasilar crackles Abdomen:  Round, mildly distended, mildly tender, (+) BS Musculoskeletal:  Warm and dry, scant BLE edema, symmetric BLE    LABS:  BMET  Recent Labs Lab 07/18/15 0215 07/19/15 0400 07/21/15 0231  NA 135 137 142  K 3.9 4.1 4.6  CL 110 113* 116*  CO2 14* 15* 17*  BUN 48* 27* 31*  CREATININE 1.30* 1.11 1.03  GLUCOSE 118* 156* 128*    Electrolytes  Recent Labs Lab 07/17/15 1600 07/18/15 0215 07/19/15 0400 07/21/15 0231  CALCIUM  --   7.8* 7.9* 8.4*  MG 2.1  --   --   --   PHOS 4.8*  --   --   --     CBC  Recent Labs Lab 07/19/15 0400  07/19/15 2345 07/20/15 0555 07/21/15 0231  WBC 5.3  --   --  7.1 8.0  HGB 8.0*  < > 8.5* 8.3* 9.5*  HCT 24.7*  < > 26.1* 25.8* 29.6*  PLT 290  --   --  298 327  < > = values in this interval not displayed.  Coag's  Recent Labs Lab 07/17/15 0500 07/17/15 0723 07/17/15 1034 07/21/15 0231  APTT 48* 83*  --   --   INR 5.81*  --  1.24 1.29    Sepsis Markers  Recent Labs Lab 07/17/15 0723 07/17/15 0735 07/17/15 1034 07/17/15 1600 07/18/15 0215 07/19/15 0400  LATICACIDVEN 1.4 1.35 2.0  --   --   --   PROCALCITON  --   --   --  0.38 0.43 0.26    ABG  Recent Labs Lab 07/17/15 1116 07/17/15 1658 07/17/15 1825  PHART 7.342* 7.332* 7.355  PCO2ART 27.7* 24.0* 22.5*  PO2ART 81.0 61.0* 112.0*    Liver Enzymes  Recent Labs Lab 07/17/15 0500 07/18/15 0215  AST 25 27  ALT 13* 13*  ALKPHOS 57 47  BILITOT 0.7 1.3*  ALBUMIN 3.2* 2.3*    Cardiac Enzymes  Recent Labs Lab 07/17/15 1600 07/17/15 2000 07/18/15 0215  TROPONINI 0.06* 0.10* 0.14*    Glucose  Recent Labs Lab 07/20/15 1306 07/20/15 1652 07/20/15 1941 07/20/15 2340 07/21/15 0410 07/21/15 0731  GLUCAP 151* 127* 172* 137* 126* 149*    Imaging No results found.   STUDIES:  CTA chest 5/13>>> 1. Bilateral pulmonary emboli identified with a moderate burden. 2. There is an enlarging node in the epicardial fat anterior to the liver. Findings of known gastric cancer seen in the distal gastric body and antrum with worsening nodes in the upper abdomen. 3. Gastric distention suggesting at least partial gastric outlet obstruction  Echo 5/13  flattening of interventricular septum with RVSP 77                   EF: [id number], systolic function severely reduced CULTURES: BCx2 5/13>>> (-) Urine 5/13>>> No Growth  ANTIBIOTICS: vanc 5/13>>> 5/15 Zosyn 5/13>>> 5/15  SIGNIFICANT  EVENTS: 5/13 admitted for acute PE with low BP. Admitted to ICU  LINES/TUBES: PIV x 2  DISCUSSION: 70yo male with invasive adenocarcinoma of the stomach s/p xrt on current oral chemo and chronic anemia presents 5/13 with dyspnea and shock.  Found to have bilat PE.    ASSESSMENT / PLAN:  PULMONARY Acute BL massive PE    No evidence of DVT per radiology 5/13 Acute resp distress after NGT placement requiring bipap > off bipap since 5/14 P:   bipap prn only Supplemental O2  Pt with GI bleed likely. With black tarry stools, also with anemia requiring transfusion.  heparin drip discontinued on 5/16. Will place IVC filter. D/w pt re: pros and cons of heparin.  Pt was on coumadin before but was stopped 2/2 anemia in oct 2016. Plan for IVC filter today.    CARDIOVASCULAR Hypotension/shock -- better with IVF, transfusion. Afib. In and out of RVR.  P:  Will give lopressor 2.5 mg IV q 4 prn for HR > 150. Once taking more PO, switch IV lopressor to PO.  Neo has been weaned off. May resume if with BP issues.  Off heparin 2/2 GI bleed.   RENAL Mild metabolic acidosis > better  AKI -- multifactorial r/t hypotension c/b metformin, diuretics, ace-i at home  Hyponatremia  P:   Trend chem  Strict I&O Volume resuscitation > NS 50 mls/hr > d/c IVF once taking adequate PO Cont clears. Advance as tolerated Continue to Hold metformin, lasix, lisinopril   GASTROINTESTINAL Invasive gastric adenocarcinoma  Anemia  Possible gastric outlet obstruction  3 BM's overnight, black ,old blood > no recurrence sionce off heparin P:   cont clears if he tolerates. Advance as tolerated  NGT to LIS  PPI   HEMATOLOGIC Acute on chronic anemia - likely 2/2 GI bleed S/ p PRBC x 2 on 07/17/2015 P:  Hold off on heparin drip 2/2 GI bleed. Discussed with pt and he agrees.    INFECTIOUS Shock -  Related to PE; better P:   Pan culture > (-) so far. Off abx.  Trend WBC/ Fever Curve  ENDOCRINE DM   P:    SSI   NEUROLOGIC No active issue  P:   Continue Supportive care .   FAMILY  - Updates:  No family at bedside. Pt is DNR > no CPR, no intubation. May use meds and defib.   - Inter-disciplinary family meet or Palliative Care meeting due by 07/24/2015  Anticipate pt will need STR or SNF. He is agrreable. Plan to transfer  out to SDU. TH will be primary starting 5/18, by then PCCM will sign off. Discussed the case with Dr. Dia Crawford 5/17 12 nn   J. Shirl Harris, MD 07/21/2015, 12:21 PM [facility] Pager [phone] After 3 pm or if no answer, call [phone]

## 2015-07-22 DIAGNOSIS — C169 Malignant neoplasm of stomach, unspecified: Secondary | ICD-10-CM | POA: Diagnosis present

## 2015-07-22 DIAGNOSIS — I82819 Embolism and thrombosis of superficial veins of unspecified lower extremities: Secondary | ICD-10-CM | POA: Diagnosis present

## 2015-07-22 DIAGNOSIS — D689 Coagulation defect, unspecified: Secondary | ICD-10-CM

## 2015-07-22 DIAGNOSIS — E118 Type 2 diabetes mellitus with unspecified complications: Secondary | ICD-10-CM | POA: Diagnosis present

## 2015-07-22 DIAGNOSIS — Z7189 Other specified counseling: Secondary | ICD-10-CM | POA: Diagnosis present

## 2015-07-22 DIAGNOSIS — I82811 Embolism and thrombosis of superficial veins of right lower extremities: Secondary | ICD-10-CM

## 2015-07-22 DIAGNOSIS — R066 Hiccough: Secondary | ICD-10-CM | POA: Diagnosis present

## 2015-07-22 DIAGNOSIS — D500 Iron deficiency anemia secondary to blood loss (chronic): Secondary | ICD-10-CM

## 2015-07-22 LAB — GLUCOSE, CAPILLARY
GLUCOSE-CAPILLARY: 107 mg/dL — AB (ref 65–99)
GLUCOSE-CAPILLARY: 108 mg/dL — AB (ref 65–99)
GLUCOSE-CAPILLARY: 137 mg/dL — AB (ref 65–99)
GLUCOSE-CAPILLARY: 148 mg/dL — AB (ref 65–99)
Glucose-Capillary: 126 mg/dL — ABNORMAL HIGH (ref 65–99)
Glucose-Capillary: 132 mg/dL — ABNORMAL HIGH (ref 65–99)

## 2015-07-22 LAB — CBC
HCT: 28.3 % — ABNORMAL LOW (ref 39.0–52.0)
Hemoglobin: 9 g/dL — ABNORMAL LOW (ref 13.0–17.0)
MCH: 29.1 pg (ref 26.0–34.0)
MCHC: 31.8 g/dL (ref 30.0–36.0)
MCV: 91.6 fL (ref 78.0–100.0)
PLATELETS: 356 10*3/uL (ref 150–400)
RBC: 3.09 MIL/uL — AB (ref 4.22–5.81)
RDW: 22.2 % — AB (ref 11.5–15.5)
WBC: 10 10*3/uL (ref 4.0–10.5)

## 2015-07-22 LAB — CULTURE, BLOOD (ROUTINE X 2)
CULTURE: NO GROWTH
CULTURE: NO GROWTH

## 2015-07-22 MED ORDER — CHLORPROMAZINE HCL 25 MG/ML IJ SOLN: 25.0000 mg | Freq: Three times a day (TID) | INTRAMUSCULAR | Status: DC | PRN

## 2015-07-22 MED ORDER — SODIUM CHLORIDE 0.9 % IV BOLUS (SEPSIS)
1000.0000 mL | Freq: Once | INTRAVENOUS | Status: AC
  Administered 2015-07-22: 1000 mL via INTRAVENOUS

## 2015-07-22 MED ORDER — SODIUM CHLORIDE 0.9 % IV SOLN
25.0000 mg | Freq: Three times a day (TID) | INTRAVENOUS | Status: DC | PRN
  Administered 2015-07-22: 25 mg via INTRAVENOUS
  Filled 2015-07-22 (×3): qty 1

## 2015-07-22 MED ORDER — METOPROLOL TARTRATE 5 MG/5ML IV SOLN
2.5000 mg | INTRAVENOUS | Status: DC
  Administered 2015-07-23 – 2015-07-26 (×21): 2.5 mg via INTRAVENOUS
  Filled 2015-07-22 (×23): qty 5

## 2015-07-22 MED ORDER — CAPECITABINE 150 MG PO TABS: 1000.0000 mg/m2 | ORAL_TABLET | Freq: Two times a day (BID) | ORAL | Status: DC

## 2015-07-22 NOTE — Progress Notes (Addendum)
Patrick Magic, Patrick [name] notified about patients HR a.fib 150-170s and BP 99991111 systolic so unable to give IV lopressor. Patient lethargic from just given IV Thorazine but oriented and answers questions approrpiately. Patrick [name] aware of patient not urinating all day. Bladder scan 345 patient not uncomfortable. Orders received for 1 L NS bolus. Will continue to monitor.  Patrick [name] updated about patients HR sustaining 130-150s and BP 90s/70s, still unable to give IV lopressor. Pt tolerating HR and BP. 1 L bolus ordered. Will continue to monitor.

## 2015-07-22 NOTE — Progress Notes (Signed)
   07/22/15 1100  Clinical Encounter Type  Visited With Patient  Visit Type Spiritual support;Follow-up  Referral From Nurse  Spiritual Encounters  Spiritual Needs Prayer  Stress Factors  Patient Stress Factors Health changes;Major life changes  Patient has requested prayer in past visits so stopped in to see if he would like it today. Wanted to pray out of gratitude for staff visiting him and staff caring for him. Chaplain also thanked God for patient's faith and devotion. Tamina Cyphers, Chaplain

## 2015-07-22 NOTE — Progress Notes (Addendum)
Daily Progress Note   Patient Name: Patrick [name].       Date: 07/22/2015 DOB: November 12, 1945  Age: 70 y.o. MRN#: [id number] Attending Physician: Allie Bossier, MD Primary Care Physician: Thressa Sheller, MD Admit Date: 07/17/2015   70 y.o. male with past medical history of invasive adenocarcinoma of the stomach followed by Dr. Alen Blew, DMII, stroke, and afib.  - He is unable to take coumadin or any anticoagulation due to GI bleeding from his gastric cancer.  He was admitted on 07/17/2015 with SOB, hematemesis and hypotension. He was found to have bilateral pulmonary embolism, and likely partial gastric outlet obstruction.  Reason for Consultation/Follow-up: Establishing goals of care  Subjective:  Patient has complaints of hiccups. Family meeting was held with the patient, his daughter, and his son in law.  We discussed his current medical condition (Bilateral PEs with right heart strain, complicated by bleeding and gastric cancer with partial obstruction).  The patient's daughter did not know of his cancer prior to this week.  The patient's chemotherapy (Xeloda) is palliative only.  The patient has an appointment with Dr. Alen Blew at the end of May.  This will be helpful to determine how fast the cancer is growing and what to expect.  We discussed further gastric outlet obstruction, further clotting, and further bleeding.  We discussed functional decline.  The family's primary concern to to ensure the patient has enough care at home.  He lives with his son Raquel Sarna, but we are uncertain whether or not the son can really provide needed care.  The patient is willing to go to rehab short term - but does not want to live in a facility long term.  He is considering moving to [place] to be close to his step  daughter Engineering geologist.  We discussed hospice services.    He is likely eligible for Hospice services.  The right time to engage hospice will depend on the patient's progression this hospitalization, his rehabilitation and Dr. Hazeline Junker plan of care.  The patient does not want artificial feeding, CPR or Intubation.  At this point he is open to all other forms of care.   Length of Stay: 5  Current Medications: Scheduled Meds:  . antiseptic oral rinse  7 mL Mouth Rinse q12n4p  . chlorhexidine  15 mL Mouth Rinse BID  .  ferrous sulfate  325 mg Oral BID WC  . folic acid  1 mg Intravenous Daily  . insulin aspart  0-9 Units Subcutaneous Q4H  . sodium chloride flush  3 mL Intravenous Q12H  . thiamine  100 mg Oral Daily    Continuous Infusions: . sodium chloride 50 mL/hr at 07/22/15 0800  . phenylephrine (NEO-SYNEPHRINE) Adult infusion Stopped (07/18/15 1230)    PRN Meds: acetaminophen **OR** acetaminophen, antiseptic oral rinse, metoprolol, prochlorperazine  Physical Exam         Well developed very pleasant man.  NAD.  N/G in place now clamped CV:  Tachycardic (150s at rest in bed) Resp:  NAD, no pain with inspiration, no w/c/r Abdomen:  Soft, NT, ND Extremities:  5/5 strength in each.  Vital Signs: BP 104/64 mmHg  Pulse 66  Temp(Src) 97.5 F (36.4 C) (Oral)  Resp 24  Ht 6' (1.829 m)  Wt 78.7 kg (173 lb 8 oz)  BMI 23.53 kg/m2  SpO2 100% SpO2: SpO2: 100 % O2 Device: O2 Device: Not Delivered O2 Flow Rate: O2 Flow Rate (L/min): 2 L/min  Intake/output summary:   Intake/Output Summary (Last 24 hours) at 07/22/15 1151 Last data filed at 07/22/15 0800  Gross per 24 hour  Intake 1836.67 ml  Output    575 ml  Net 1261.67 ml   LBM: Last BM Date: 07/21/15 Baseline Weight: Weight: 76.204 kg (168 lb) Most recent weight: Weight: 78.7 kg (173 lb 8 oz)       Palliative Assessment/Data:    Flowsheet Rows        Most Recent Value   Intake Tab    Referral Department  Critical  care   Unit at Time of Referral  Med/Surg Unit   Palliative Care Primary Diagnosis  Cancer   Date Notified  07/18/15   Palliative Care Type  New Palliative care   Reason for referral  Clarify Goals of Care   Date of Admission  07/17/15   Date first seen by Palliative Care  07/19/15   # of days Palliative referral response time  1 Day(s)   # of days IP prior to Palliative referral  1   Clinical Assessment    Palliative Performance Scale Score  60%   Psychosocial & Spiritual Assessment    Palliative Care Outcomes    Patient/Family meeting held?  Yes   Who was at the meeting?  PATIENT, AND STEP DTR BY PHONE   Palliative Care Outcomes  Provided advance care planning, Changed CPR status   Patient/Family wishes: Interventions discontinued/not started   Mechanical Ventilation, PEG, Tube feedings/TPN      Patient Active Problem List   Diagnosis Date Noted  . Advanced directives, counseling/discussion   . Absolute anemia   . Goals of care, counseling/discussion   . Palliative care encounter   . Partial gastric outlet obstruction   . Hypotension 07/17/2015  . Bilateral pulmonary embolism ([place]) 07/17/2015  . Dehydration with hyponatremia 07/17/2015  . Pulmonary embolism ([place]) 07/17/2015  . QT prolongation   . Anemia associated with acute blood loss 01/12/2015  . Acute kidney injury ([place])   . Upper GI bleed   . Coagulopathy ([place])   . Bleeding gastrointestinal   . GI bleed 12/20/2014  . Diabetes mellitus type II, non insulin dependent ([place]) 12/20/2014  . Acute blood loss anemia 12/20/2014  . 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 Iron deficiency anemia secondary to blood loss (chronic) 10/30/2013  . 110.1 03/03/2013  .  Dyslipidemia, goal LDL below 130 - on statin; followed by PCP 11/21/2012  . Chronic atrial fibrillation ([place]) 05/23/2012  . Essential hypertension 09/28/2010  . Diabetes mellitus type II, uncontrolled ([place]) 09/28/2010  . Stroke [facility]) 09/28/2010     Palliative Care Assessment & Plan    Assessment: Patient with bilateral PEs, Right heart strain and tachycardia.  He has had melena - likely due to bleeding from his gastric cancer.  There is a question of partial gastric outlet obstruction.  Last imaging of gastric cancer was 03/31/15 PET which showed progressive disease with increased thickening (11mm) of the distal body and antrum wall and increasing metastatic lymphadenopathy.  He has had a small area of ?mets on left sacrum at S1 since 2015.  Recommendations/Plan:  Family and patient have changed code status to partial.  Family meeting completed 5/18 with step daughter Chauncey Reading) Engineering geologist and son in Scientist, clinical (histocompatibility and immunogenetics).  Living Will / HCPOA completed.  Greatly appreciate Chaplain Service's assistance.  Thorazine added PRN for Hiccups after discussion with Attending MD.  Please request palliative medicine to follow at [facility].    Code Status:  Limited Code:  No CPR, Intubation or artificial feeding.  Otherwise Aggressive care.  Prognosis:   Unable to determine.   Less than 6 months based on progressive gastric cancer.  Possibly significantly less if he continues to bleed, clot or is obstructed.  Discharge Planning:  To Be Determined.  Likely to home.  Possibly with hospice services.  Care plan was discussed with Patient's step daughter, Chauncey Reading  Thank you for allowing the Palliative Medicine Team to assist in the care of this patient.   Time In: 11:00 Time Out: 11:35 Total Time 35 Prolonged Time Billed NO      Greater than 50%  of this time was spent counseling and coordinating care related to the above assessment and plan.  Melton Alar, PA-C  Please contact Palliative Medicine Team phone at [phone] for questions and concerns.

## 2015-07-22 NOTE — Progress Notes (Signed)
PROGRESS NOTE    Patrick [name].  LS:[id number] DOB: October 04, 1945 DOA: 07/17/2015 PCP: Thressa Sheller, MD   Brief Narrative:  70 y.o. WM PMHx Gastric Adenocarcinoma status post radiation therapy on palliative Xeloda, HTN, Diabetes type 2 on Metformin, iron Deficiency Anemia with baseline hemoglobin around 9 in the setting of ongoing chronic blood loss, Chronic Atrial Fibrillation not an anticoagulation secondary to the above issues, HTN. Patient presented to the ER with reports of shortness of breath.  Upon arrival to the ER patient was hypotensive with a systolic blood pressure in the 70s. ER physician was concerned in the setting of malignancy patient may have PE given presenting symptoms with associated dyspnea so a bedside quick look echocardiogram was completed that was concerning for RV strain. Labs also revealed acute kidney injury. EDP discussed with radiologist who recommended a low dye load CT angiogram of the chest to evaluate for PE. Initial lab data revealed an unexplained coagulopathy with an INR greater than 5 without overt signs of bleeding and normal platelet count. Because of this empiric heparin was placed on hold until CT of the chest could be completed.  Upon my evaluation of the patient he denied issues such as nausea vomiting diarrhea and inability to eat, any postprandial bloating or abdominal pain. He reports for the past 2 days he's been slightly more short of breath than usual not definitely correlated with activity. He developed sudden shortness of breath early this morning without chest pain and had not noticed any heart racing. He has not had any blood or dark stools.  ED Course:  Afebrile-initial blood pressure 75/46 -MAP 54-pulse 82-respirations 19-room air saturations at rest 100%; patient comfort ER staff placed patient on 2 L nasal cannula Repeat vital signs after fluid challenge: BP 94/64 with an MAP of 74-pulse 109-respirations 27 CT angiogram of the chest  with limited contrast medium: Portable chest x-ray: Unremarkable Lab data: Sodium 129, chloride 95, CO2 17, BUN 97, creatinine 2.47, glucose 213, anion gap 17, LFTs are normal except for slightly low ALT of 13, BNP 314, troponin 0.03, lactic acid 1.35, WBC 5700 with neutrophils 84% and absolute neutrophils 4.8%, hemoglobin 7.1, platelets 421,000; ABG with mild acidosis pH 7.32 with normal PCO2 of 28.4, PO2 normal at 86 bicarbonate 14.7 and acid base deficit [id number] c/w with metabolic acidemia, urinalysis cloudy without evidence of proteinuria or infection and slightly elevated specific gravity of 1.021, urine culture and blood cultures have been obtained in the ER; repeat coags and DIC panel is pending A total of or liters normal saline bolus has been infused while in the ER Heparin bolus was given 3000 units but infusion placed on hold given coagulopathy until diagnosis of PE confirmed   Assessment & Plan:   Principal Problem:   Bilateral pulmonary embolism (HCC) Active Problems:   Essential hypertension   Diabetes mellitus type II, uncontrolled (HCC)   Chronic atrial fibrillation (HCC)   Iron deficiency anemia secondary to blood loss (chronic)   TxN1 adenocarcinoma of the gastric antrum   Coagulopathy (HCC)   Acute kidney injury ([place])   Hypotension   Dehydration with hyponatremia   Pulmonary embolism (HCC)   Goals of care, counseling/discussion   Palliative care encounter   Partial gastric outlet obstruction   Advanced directives, counseling/discussion   Absolute anemia   Hiccups   Encounter for hospice care discussion   Venous embolism and thrombosis of superficial vessels of lower extremity   Controlled diabetes mellitus type 2 with complications ([place])  Gastric adenocarcinoma ([place])   Bilateral Pulmonary Embolism/Hypotension -Patient presented with dyspnea and hypotension with quick look bedside ECHO concerning for RV strain; CT angiogram of the chest confirmed bilateral pulmonary  emboli with moderate clot burden -In setting of initial unexplained coagulopathy I discussed with patient's oncologist Dr. Alen Blew who agrees appropriate to utilize IV heparin  -Bilateral Doppler; positive left SVT see results below -5/17 S/P placement of IVC filter  Left lower extremity SVT -Left positive SVT perforator vein mid calf. -See PE  Cardiomyopathy/ Pulmonary hypertension/Sinus tachycardia -Patient has sinus tachycardia with borderline BP, will try scheduled metoprolol. -Echocardiogram; cardiomyopathy with severe pulmonary hypertension see results below -Metoprolol IV 2.5 mg q 4hr  Essential Hypertension/Hypotension -BP continues to be borderline  -Continue to hold preadmission carvedilol, diltiazem ex RR, Lasix, lisinopril hydrochlorothiazide -See cardiomyopathy  Chronic atrial fibrillation(CHADVASC = 5) -Initially presented with RVR and now sinus tachycardia -Negative anticoagulant n past to h/o GIB 2/2 gastric cancer - QT prolongation -In the setting of acute PE. Resolved   Coagulopathy  -INR normalized unfortunately did not help with patient's GI bleeding. -5/13 transfused 2 units RBC -Monitor for GI bleeding symptoms given history (see below) -Because of underlying gastric cancer with history of ongoing occult GIB was not  candidate for thrombolytic therapy  -Heparin DC'd and filter placed on 5/17--> GI bleed -Avoid all blood thinning medications, as patient has not tolerated   Acute kidney injury  -Patient's BUN and creatinine nearly double baseline of 20/1.1 in March 2017 -Patient was developing azotemia and April 2017 with BUN up to 54 and creatinine 1.6 noting he was on thiazide diuretics and Lasix prior to admission; held secondary to hypotension Lab Results  Component Value Date   CREATININE 1.03 07/21/2015   CREATININE 1.11 07/19/2015   CREATININE 1.30* 07/18/2015  -Resolved  Iron deficiency anemia secondary to blood loss (chronic)(Baseline  hemoglobin~9.5 -11)  -Of note also has a history of chronic iron deficiency and per oncology notes has responded to IV iron in the past -check anemia panel   TxN1 adenocarcinoma of the gastric antrum -Last PET scan January 2017 mild progression without widespread disease and stable on Xeloda for palliative purposes -Last evaluated by oncology 06/17/15 -Restart Xeloda 1000 mg BID in A.m. (patient has brought his own medication) -PT/OT consult pending; patient deconditioned from metastatic cancer, and PE.  Gastric outlet obstruction -See CTA -Patient had been NPO, now on full liquids however large emesis today. Patient willing to continue with full liquids but in smaller amounts.   Diabetes mellitus type II, Controlled with complication -Holding metformin the setting of acute kidney injury and presumed ATN -5/13 Hemoglobin A1c= 6.6  -Sensitive SSI   Goals of care -Palliative care consulted and working with patient, appears patient may want to be discharged home with hospice   DVT prophylaxis: SCD Code Status: Partial Family Communication: Family present Disposition Plan: Stabilization of PE and its sequela; home hospice?   Consultants:  Dr. Mike Gip de [facility]    Procedures/Significant Events:  5/13 transfused 2 units RBC 5/13 CTA chest;-Bilateral PE with a moderate burden.-Enlarging node in the epicardial fat anterior to the Liver.-Findings known gastric cancer seen in the distal gastric body and antrum with worsening nodes upper abdomen. -Gastric distention suggesting partial gastric outlet obstruction. 5/13 echocardiogram;Left ventricle: moderate LVH.-LVEF= 60%-65%. - Ventricular septum: The contour showed diastolic flattening and systolic flattening. - Left atrium: moderately dilated.-- Right ventricle: moderately dilated.  - Right atrium:  moderately dilated.--Tricuspid valve: moderate-severe regurgitation. - Pulmonary  arteries:  PA peak pressure: 77 mm Hg (S). 5/13 bilateral lower extremity Doppler; negative right DVT/SVT. Left positive SVT perforator vein mid calf.  5/17 IVC filter placed, infrarenal, retrievable   Cultures BCx2 5/13>>> (-) Urine 5/13>>> No Growth  Antimicrobials: vanc 5/13>>> 5/15 Zosyn 5/13>>> 5/15   Devices    LINES / TUBES:  PIV x 2    Continuous Infusions: . sodium chloride 50 mL/hr at 07/22/15 1900  . phenylephrine (NEO-SYNEPHRINE) Adult infusion Stopped (07/18/15 1230)     Subjective: 5/18 A/O 4, sitting in chair mild discomfort. States earlier in the day without warning positive bilious vomiting. Currently negative nausea but feels needs to be reconnected to continuous suctioning.   Objective: Filed Vitals:   07/22/15 1815 07/22/15 1830 07/22/15 1845 07/22/15 1900  BP: 97/59 110/99 108/75 110/76  Pulse:    81  Temp:      TempSrc:      Resp: 30 29 24 18   Height:      Weight:      SpO2:    100%    Intake/Output Summary (Last 24 hours) at 07/22/15 1948 Last data filed at 07/22/15 1900  Gross per 24 hour  Intake   2155 ml  Output   1075 ml  Net   1080 ml   Filed Weights   07/20/15 0500 07/21/15 0500 07/22/15 0500  Weight: 84.5 kg (186 lb 4.6 oz) 78.3 kg (172 lb 9.9 oz) 78.7 kg (173 lb 8 oz)    Examination:  General: A/O 4, sitting in chair mild discomfort. , No acute respiratory distress Eyes: negative scleral hemorrhage, negative anisocoria, negative icterus ENT: Negative Runny nose, negative gingival bleeding, NG tube present right nare Neck:  Negative scars, masses, torticollis, lymphadenopathy, JVD Lungs: Clear to auscultation bilaterally without wheezes or crackles Cardiovascular: Sinus tachycardia, without murmur gallop or rub normal S1 and S2 Abdomen: negative abdominal pain, positive distended, hypoactive bowel sounds, no rebound, no ascites, no appreciable mass Extremities: No significant cyanosis, clubbing, or edema bilateral  lower extremities Skin: Negative rashes, lesions, ulcers Psychiatric:  Negative depression, negative anxiety, negative fatigue, negative mania  Central nervous system:  Cranial nerves II through XII intact, tongue/uvula midline, all extremities muscle strength 5/5, sensation intact throughout,negative dysarthria, negative expressive aphasia, negative receptive aphasia.  .     Data Reviewed: Care during the described time interval was provided by me .  I have reviewed this patient's available data, including medical history, events of note, physical examination, and all test results as part of my evaluation. I have personally reviewed and interpreted all radiology studies.  CBC:  Recent Labs Lab 07/17/15 0500  07/18/15 0215 07/19/15 0400  07/19/15 1740 07/19/15 2345 07/20/15 0555 07/21/15 0231 07/22/15 0309  WBC 5.7  < > 5.6 5.3  --   --   --  7.1 8.0 10.0  NEUTROABS 4.8  --   --   --   --   --   --   --   --   --   HGB 7.1*  < > 8.6* 8.0*  < > 8.7* 8.5* 8.3* 9.5* 9.0*  HCT 21.7*  < > 25.5* 24.7*  < > 26.3* 26.1* 25.8* 29.6* 28.3*  MCV 89.3  < > 88.2 90.1  --   --   --  91.8 91.9 91.6  PLT 421*  < > 318 290  --   --   --  298 327 356  < > =  values in this interval not displayed. Basic Metabolic Panel:  Recent Labs Lab 07/17/15 0500 07/17/15 0518 07/17/15 1034 07/17/15 1600 07/18/15 0215 07/19/15 0400 07/21/15 0231  NA 129* 129* 131*  --  135 137 142  K 4.9 4.9 4.4  --  3.9 4.1 4.6  CL 95* 96* 106  --  110 113* 116*  CO2 17*  --  10*  --  14* 15* 17*  GLUCOSE 213* 207* 157*  --  118* 156* 128*  BUN 97* 90* 82*  --  48* 27* 31*  CREATININE 2.47* 2.50* 1.99*  --  1.30* 1.11 1.03  CALCIUM 8.7*  --  7.0*  --  7.8* 7.9* 8.4*  MG  --   --   --  2.1  --   --   --   PHOS  --   --   --  4.8*  --   --   --    GFR: Estimated Creatinine Clearance: 72.2 mL/min (by C-G formula based on Cr of 1.03). Liver Function Tests:  Recent Labs Lab 07/17/15 0500 07/18/15 0215  AST 25  27  ALT 13* 13*  ALKPHOS 57 47  BILITOT 0.7 1.3*  PROT 6.6 5.0*  ALBUMIN 3.2* 2.3*   No results for input(s): LIPASE, AMYLASE in the last 168 hours. No results for input(s): AMMONIA in the last 168 hours. Coagulation Profile:  Recent Labs Lab 07/17/15 0500 07/17/15 1034 07/21/15 0231  INR 5.81* 1.24 1.29   Cardiac Enzymes:  Recent Labs Lab 07/17/15 0723 07/17/15 1600 07/17/15 2000 07/18/15 0215  CKTOTAL 475*  --   --   --   TROPONINI  --  0.06* 0.10* 0.14*   BNP (last 3 results) No results for input(s): PROBNP in the last 8760 hours. HbA1C: No results for input(s): HGBA1C in the last 72 hours. CBG:  Recent Labs Lab 07/21/15 2308 07/22/15 0333 07/22/15 0728 07/22/15 1158 07/22/15 1631  GLUCAP 96 108* 126* 132* 148*   Lipid Profile: No results for input(s): CHOL, HDL, LDLCALC, TRIG, CHOLHDL, LDLDIRECT in the last 72 hours. Thyroid Function Tests: No results for input(s): TSH, T4TOTAL, FREET4, T3FREE, THYROIDAB in the last 72 hours. Anemia Panel: No results for input(s): VITAMINB12, FOLATE, FERRITIN, TIBC, IRON, RETICCTPCT in the last 72 hours. Urine analysis:    Component Value Date/Time   COLORURINE YELLOW 07/17/2015 0655   APPEARANCEUR CLOUDY* 07/17/2015 0655   LABSPEC 1.021 07/17/2015 0655   PHURINE 5.0 07/17/2015 0655   GLUCOSEU NEGATIVE 07/17/2015 0655   HGBUR NEGATIVE 07/17/2015 0655   BILIRUBINUR NEGATIVE 07/17/2015 0655   KETONESUR NEGATIVE 07/17/2015 0655   PROTEINUR NEGATIVE 07/17/2015 0655   UROBILINOGEN 1.0 12/20/2014 1110   NITRITE NEGATIVE 07/17/2015 0655   LEUKOCYTESUR NEGATIVE 07/17/2015 0655   Sepsis Labs: @LABRCNTIP (procalcitonin:4,lacticidven:4)  ) Recent Results (from the past 240 hour(s))  Blood Culture (routine x 2)     Status: None   Collection Time: 07/17/15  5:08 AM  Result Value Ref Range Status   Specimen Description BLOOD RIGHT ANTECUBITAL  Final   Special Requests BOTTLES DRAWN AEROBIC AND ANAEROBIC 5ML  Final    Culture NO GROWTH 5 DAYS  Final   Report Status 07/22/2015 FINAL  Final  Blood Culture (routine x 2)     Status: None   Collection Time: 07/17/15  5:30 AM  Result Value Ref Range Status   Specimen Description BLOOD LEFT ANTECUBITAL  Final   Special Requests BOTTLES DRAWN AEROBIC AND ANAEROBIC 4ML  Final   Culture  NO GROWTH 5 DAYS  Final   Report Status 07/22/2015 FINAL  Final  Urine culture     Status: None   Collection Time: 07/17/15  6:55 AM  Result Value Ref Range Status   Specimen Description URINE, CATHETERIZED  Final   Special Requests NONE  Final   Culture NO GROWTH  Final   Report Status 07/18/2015 FINAL  Final  MRSA PCR Screening     Status: None   Collection Time: 07/17/15  4:00 PM  Result Value Ref Range Status   MRSA by PCR NEGATIVE NEGATIVE Final    Comment:        The GeneXpert MRSA Assay (FDA approved for NASAL specimens only), is one component of a comprehensive MRSA colonization surveillance program. It is not intended to diagnose MRSA infection nor to guide or monitor treatment for MRSA infections.          Radiology Studies: Ir Ivc Filter Plmt / S&i /img Guid/mod Sed  07/21/2015  CLINICAL DATA:  Pulmonary emboli. GI bleed on anticoagulation, a relative contraindication. Caval filtration is requested. Circumaortic left renal vein on previous CT. EXAM: INFERIOR VENACAVOGRAM IVC FILTER PLACEMENT UNDER FLUOROSCOPY FLUOROSCOPY TIME:  1.5 minute, 188 uGym2 DAP TECHNIQUE: The procedure, risks (including but not limited to bleeding, infection, organ damage ), benefits, and alternatives were explained to the patient. Questions regarding the procedure were encouraged and answered. The patient understands and consents to the procedure. Patency of the right IJ vein was confirmed with ultrasound with image documentation. An appropriate skin site was determined. Skin site was marked, prepped with chlorhexidine, and draped using maximum barrier technique. The region was  infiltrated locally with 1% lidocaine. Intravenous Fentanyl and Versed were administered as conscious sedation during continuous monitoring of the patient's level of consciousness and physiological / cardiorespiratory status by the radiology RN, with a total moderate sedation time of 9 minutes. Under real-time ultrasound guidance, the right IJ vein was accessed with a 21 gauge micropuncture needle; the needle tip within the vein was confirmed with ultrasound image documentation. The needle was exchanged over a 018 guidewire for a transitional dilator, which allow advancement of the [facility] wire into the IVC. A long 6 French vascular sheath was placed for inferior venacavography. This demonstrated no caval thrombus. Renal vein inflows were evident. The [employer] IVC filter was advanced through the sheath and successfully deployed under fluoroscopy at the L3-4 level. Followup cavagram demonstrates stable filter position and no evident complication. The sheath was removed and hemostasis achieved at the site. No immediate complication. IMPRESSION: 1. Normal IVC. No thrombus or significant anatomic variation. 2. Technically successful infrarenal IVC filter placement. This is a retrievable model. Electronically Signed   By: Lucrezia Europe M.D.   On: 07/21/2015 16:25        Scheduled Meds: . antiseptic oral rinse  7 mL Mouth Rinse q12n4p  . [START ON 07/23/2015] capecitabine  1,000 mg/m2 Oral BID PC  . chlorhexidine  15 mL Mouth Rinse BID  . ferrous sulfate  325 mg Oral BID WC  . folic acid  1 mg Intravenous Daily  . insulin aspart  0-9 Units Subcutaneous Q4H  . metoprolol  2.5 mg Intravenous Q4H  . sodium chloride  1,000 mL Intravenous Once  . sodium chloride flush  3 mL Intravenous Q12H  . thiamine  100 mg Oral Daily   Continuous Infusions: . sodium chloride 50 mL/hr at 07/22/15 1900  . phenylephrine (NEO-SYNEPHRINE) Adult infusion Stopped (07/18/15 1230)  LOS: 5 days    Time spent: 40  minutes    WOODS, Geraldo Docker, MD [facility] Pager [phone]   If 7PM-7AM, please contact night-coverage [email or web address] Password [facility] 07/22/2015, 7:48 PM

## 2015-07-23 ENCOUNTER — Encounter: Payer: Self-pay | Admitting: *Deleted

## 2015-07-23 DIAGNOSIS — I480 Paroxysmal atrial fibrillation: Secondary | ICD-10-CM

## 2015-07-23 LAB — BASIC METABOLIC PANEL
ANION GAP: 13 (ref 5–15)
BUN: 32 mg/dL — ABNORMAL HIGH (ref 6–20)
CO2: 16 mmol/L — ABNORMAL LOW (ref 22–32)
Calcium: 8.6 mg/dL — ABNORMAL LOW (ref 8.9–10.3)
Chloride: 113 mmol/L — ABNORMAL HIGH (ref 101–111)
Creatinine, Ser: 0.97 mg/dL (ref 0.61–1.24)
Glucose, Bld: 154 mg/dL — ABNORMAL HIGH (ref 65–99)
POTASSIUM: 5.3 mmol/L — AB (ref 3.5–5.1)
SODIUM: 142 mmol/L (ref 135–145)

## 2015-07-23 LAB — GLUCOSE, CAPILLARY
GLUCOSE-CAPILLARY: 103 mg/dL — AB (ref 65–99)
Glucose-Capillary: 126 mg/dL — ABNORMAL HIGH (ref 65–99)
Glucose-Capillary: 123 mg/dL — ABNORMAL HIGH (ref 65–99)
Glucose-Capillary: 134 mg/dL — ABNORMAL HIGH (ref 65–99)
Glucose-Capillary: 135 mg/dL — ABNORMAL HIGH (ref 65–99)

## 2015-07-23 LAB — CBC WITH DIFFERENTIAL/PLATELET
BASOS PCT: 0 %
Basophils Absolute: 0 10*3/uL (ref 0.0–0.1)
EOS ABS: 0 10*3/uL (ref 0.0–0.7)
EOS PCT: 0 %
HEMATOCRIT: 32.1 % — AB (ref 39.0–52.0)
HEMOGLOBIN: 9.9 g/dL — AB (ref 13.0–17.0)
Lymphocytes Relative: 5 %
Lymphs Abs: 0.6 10*3/uL — ABNORMAL LOW (ref 0.7–4.0)
MCH: 28.4 pg (ref 26.0–34.0)
MCHC: 30.8 g/dL (ref 30.0–36.0)
MCV: 92.2 fL (ref 78.0–100.0)
MONO ABS: 0.9 10*3/uL (ref 0.1–1.0)
Monocytes Relative: 8 %
NEUTROS ABS: 10.3 10*3/uL — AB (ref 1.7–7.7)
Neutrophils Relative %: 87 %
Platelets: 336 10*3/uL (ref 150–400)
RBC: 3.48 MIL/uL — ABNORMAL LOW (ref 4.22–5.81)
RDW: 22 % — AB (ref 11.5–15.5)
WBC: 11.8 10*3/uL — ABNORMAL HIGH (ref 4.0–10.5)

## 2015-07-23 LAB — MAGNESIUM: Magnesium: 2.3 mg/dL (ref 1.7–2.4)

## 2015-07-23 MED ORDER — CAPECITABINE 500 MG PO TABS
1000.0000 mg | ORAL_TABLET | Freq: Two times a day (BID) | ORAL | Status: DC
  Administered 2015-07-23 – 2015-07-24 (×4): 1000 mg via ORAL
  Filled 2015-07-23 (×3): qty 2

## 2015-07-23 MED ORDER — DIGOXIN 0.25 MG/ML IJ SOLN
0.1250 mg | Freq: Every day | INTRAMUSCULAR | Status: DC
  Administered 2015-07-23: 0.125 mg via INTRAVENOUS
  Filled 2015-07-23: qty 2

## 2015-07-23 MED ORDER — METOPROLOL TARTRATE 5 MG/5ML IV SOLN
2.5000 mg | Freq: Once | INTRAVENOUS | Status: AC
  Administered 2015-07-23: 2.5 mg via INTRAVENOUS

## 2015-07-23 MED ORDER — FOLIC ACID 1 MG PO TABS
1.0000 mg | ORAL_TABLET | Freq: Every day | ORAL | Status: DC
  Administered 2015-07-24: 1 mg via ORAL
  Filled 2015-07-23 (×2): qty 1

## 2015-07-23 NOTE — Care Management Important Message (Signed)
Important Message  Patient Details  Name: Patrick [name]. MRN: [id number] Date of Birth: [birth year]   Medicare Important Message Given:  Yes    Ivon Roedel, Kym Groom, RN 07/23/2015, 2:17 PM

## 2015-07-23 NOTE — Progress Notes (Signed)
PROGRESS NOTE    Patrick [name].  LS:[id number] DOB: [birth year] DOA: 07/17/2015 PCP: Thressa Sheller, MD   Brief Narrative:  70 y.o. WM PMHx Gastric Adenocarcinoma status post radiation therapy on palliative Xeloda, HTN, Diabetes type 2 on Metformin, iron Deficiency Anemia with baseline hemoglobin around 9 in the setting of ongoing chronic blood loss, Chronic Atrial Fibrillation not an anticoagulation secondary to the above issues, HTN. Patient presented to the ER with reports of shortness of breath.  Upon arrival to the ER patient was hypotensive with a systolic blood pressure in the 70s. ER physician was concerned in the setting of malignancy patient may have PE given presenting symptoms with associated dyspnea so a bedside quick look echocardiogram was completed that was concerning for RV strain. Labs also revealed acute kidney injury. EDP discussed with radiologist who recommended a low dye load CT angiogram of the chest to evaluate for PE. Initial lab data revealed an unexplained coagulopathy with an INR greater than 5 without overt signs of bleeding and normal platelet count. Because of this empiric heparin was placed on hold until CT of the chest could be completed.  Upon my evaluation of the patient he denied issues such as nausea vomiting diarrhea and inability to eat, any postprandial bloating or abdominal pain. He reports for the past 2 days he's been slightly more short of breath than usual not definitely correlated with activity. He developed sudden shortness of breath early this morning without chest pain and had not noticed any heart racing. He has not had any blood or dark stools.  ED Course:  Afebrile-initial blood pressure 75/46 -MAP 54-pulse 82-respirations 19-room air saturations at rest 100%; patient comfort ER staff placed patient on 2 L nasal cannula Repeat vital signs after fluid challenge: BP 94/64 with an MAP of 74-pulse 109-respirations 27 CT angiogram of the chest  with limited contrast medium: Portable chest x-ray: Unremarkable Lab data: Sodium 129, chloride 95, CO2 17, BUN 97, creatinine 2.47, glucose 213, anion gap 17, LFTs are normal except for slightly low ALT of 13, BNP 314, troponin 0.03, lactic acid 1.35, WBC 5700 with neutrophils 84% and absolute neutrophils 4.8%, hemoglobin 7.1, platelets 421,000; ABG with mild acidosis pH 7.32 with normal PCO2 of 28.4, PO2 normal at 86 bicarbonate 14.7 and acid base deficit [id number] c/w with metabolic acidemia, urinalysis cloudy without evidence of proteinuria or infection and slightly elevated specific gravity of 1.021, urine culture and blood cultures have been obtained in the ER; repeat coags and DIC panel is pending A total of or liters normal saline bolus has been infused while in the ER Heparin bolus was given 3000 units but infusion placed on hold given coagulopathy until diagnosis of PE confirmed   Assessment & Plan:   Principal Problem:   Bilateral pulmonary embolism (HCC) Active Problems:   Essential hypertension   Diabetes mellitus type II, uncontrolled (HCC)   Chronic atrial fibrillation (HCC)   Iron deficiency anemia secondary to blood loss (chronic)   TxN1 adenocarcinoma of the gastric antrum   Coagulopathy (HCC)   Acute kidney injury ([place])   Hypotension   Dehydration with hyponatremia   Pulmonary embolism (HCC)   Goals of care, counseling/discussion   Palliative care encounter   Partial gastric outlet obstruction   Advanced directives, counseling/discussion   Absolute anemia   Hiccups   Encounter for hospice care discussion   Venous embolism and thrombosis of superficial vessels of lower extremity   Controlled diabetes mellitus type 2 with complications ([place])  Gastric adenocarcinoma ([place])   Bilateral Pulmonary Embolism/Hypotension -Patient presented with dyspnea and hypotension with quick look bedside ECHO concerning for RV strain; CT angiogram of the chest confirmed bilateral pulmonary  emboli with moderate clot burden -In setting of initial unexplained coagulopathy I discussed with patient's oncologist Dr. Alen Blew who agrees appropriate to utilize IV heparin  -Bilateral Doppler; positive left SVT see results below -5/17 S/P placement of IVC filter  Left lower extremity SVT -Left positive SVT perforator vein mid calf. -See PE  Cardiomyopathy/ Pulmonary hypertension/Sinus tachycardia -Patient has sinus tachycardia with borderline BP, will try scheduled metoprolol. -Echocardiogram; cardiomyopathy with severe pulmonary hypertension see results below -Metoprolol IV 2.5 mg q 4hr  Essential Hypertension/Hypotension -BP continues to be borderline  -Continue to hold preadmission carvedilol, diltiazem ex RR, Lasix, lisinopril hydrochlorothiazide -See cardiomyopathy  Paroxysmal atrial fibrillation(CHADVASC = 5) -Initially presented with RVR and now sinus tachycardia -Negative anticoagulant n past to h/o GIB 2/2 gastric cancer - Patient still not rate controlled A. fib RVR--> sinus tachycardia HR 140+ -Continued relative hypotension; Digoxin IV 0.125 mg daily  QT prolongation -In the setting of acute PE. Resolved   Coagulopathy  -INR normalized unfortunately did not help with patient's GI bleeding. -5/13 transfused 2 units RBC -Monitor for GI bleeding symptoms given history (see below) -Because of underlying gastric cancer with history of ongoing occult GIB was not  candidate for thrombolytic therapy  -Heparin DC'd and filter placed on 5/17--> GI bleed -Avoid all blood thinning medications, as patient has not tolerated   Acute kidney injury  -Patient's BUN and creatinine nearly double baseline of 20/1.1 in March 2017 -Patient was developing azotemia and April 2017 with BUN up to 54 and creatinine 1.6 noting he was on thiazide diuretics and Lasix prior to admission; held secondary to hypotension Lab Results  Component Value Date   CREATININE 1.03 07/21/2015    CREATININE 1.11 07/19/2015   CREATININE 1.30* 07/18/2015  -Resolved  Iron deficiency anemia secondary to blood loss (chronic)(Baseline hemoglobin~9.5 -11)  -Of note also has a history of chronic iron deficiency and per oncology notes has responded to IV iron in the past -check anemia panel Recent Labs Lab 07/19/15 2345 07/20/15 0555 07/21/15 0231 07/22/15 0309 07/23/15 1807  HGB 8.5* 8.3* 9.5* 9.0* 9.9*  -Hemoglobin slowly improving   TxN1 adenocarcinoma of the gastric antrum -Last PET scan January 2017 mild progression without widespread disease and stable on Xeloda for palliative purposes -Last evaluated by oncology 06/17/15 -Restart Xeloda 1000 mg BID in A.m. (patient has brought his own medication) -PT/OT consult pending; patient deconditioned from metastatic cancer, and PE.  Gastric outlet obstruction -See CTA -Tolerating full liquids today. Will consider advancing diet in A.m.    Diabetes mellitus type II, Controlled with complication -Holding metformin the setting of acute kidney injury and presumed ATN -5/13 Hemoglobin A1c= 6.6  -Sensitive SSI   Goals of care -Per patient son and daughter-in-law were given list of SNF and are currently evaluating. Speak with NCM Blain Pais and NP Haynes Dage (palliative care) on 5/20    DVT prophylaxis: SCD Code Status: Partial Family Communication: Family present Disposition Plan: Stabilization of PE and its sequela; home hospice?   Consultants:  Dr. Mike Gip de [facility]    Procedures/Significant Events:  5/13 transfused 2 units RBC 5/13 CTA chest;-Bilateral PE with a moderate burden.-Enlarging node in the epicardial fat anterior to the Liver.-Findings known gastric cancer seen in the distal gastric body and antrum  with worsening nodes upper abdomen. -Gastric distention suggesting partial gastric outlet obstruction. 5/13 echocardiogram;Left ventricle: moderate LVH.-LVEF= 60%-65%. -  Ventricular septum: The contour showed diastolic flattening and systolic flattening. - Left atrium: moderately dilated.-- Right ventricle: moderately dilated.  - Right atrium: moderately dilated.--Tricuspid valve: moderate-severe regurgitation. - Pulmonary arteries:  PA peak pressure: 77 mm Hg (S). 5/13 bilateral lower extremity Doppler; negative right DVT/SVT. Left positive SVT perforator vein mid calf.  5/17 IVC filter placed, infrarenal, retrievable   Cultures BCx2 5/13>>> (-) Urine 5/13>>> No Growth  Antimicrobials: vanc 5/13>>> 5/15 Zosyn 5/13>>> 5/15   Devices    LINES / TUBES:  PIV x 2    Continuous Infusions: . sodium chloride 50 mL/hr at 07/23/15 0800  . phenylephrine (NEO-SYNEPHRINE) Adult infusion Stopped (07/18/15 1230)     Subjective: 5/19 A/O 4, sitting in chair mild discomfort. Did not require continuous suction today. Tolerated full liquid diet.  .   Objective: Filed Vitals:   07/23/15 1149 07/23/15 1200 07/23/15 1300 07/23/15 1400  BP:  116/80    Pulse:  88    Temp: 97.2 F (36.2 C)     TempSrc: Oral     Resp:  20 15 22   Height:      Weight:      SpO2:  71%  93%    Intake/Output Summary (Last 24 hours) at 07/23/15 1505 Last data filed at 07/23/15 1400  Gross per 24 hour  Intake   3645 ml  Output   1975 ml  Net   1670 ml   Filed Weights   07/20/15 0500 07/21/15 0500 07/22/15 0500  Weight: 84.5 kg (186 lb 4.6 oz) 78.3 kg (172 lb 9.9 oz) 78.7 kg (173 lb 8 oz)    Examination:  General: A/O 4, sitting in chair NAD. , No acute respiratory distress Eyes: negative scleral hemorrhage, negative anisocoria, negative icterus ENT: Negative Runny nose, negative gingival bleeding, NG tube present right nare Neck:  Negative scars, masses, torticollis, lymphadenopathy, JVD Lungs: Clear to auscultation bilaterally without wheezes or crackles Cardiovascular: Sinus tachycardia, without murmur gallop or rub normal S1 and S2 Abdomen: negative  abdominal pain, positive distended, hypoactive bowel sounds, no rebound, no ascites, no appreciable mass Extremities: No significant cyanosis, clubbing, or edema bilateral lower extremities Skin: Negative rashes, lesions, ulcers Psychiatric:  Negative depression, negative anxiety, negative fatigue, negative mania  Central nervous system:  Cranial nerves II through XII intact, tongue/uvula midline, all extremities muscle strength 5/5, sensation intact throughout,negative dysarthria, negative expressive aphasia, negative receptive aphasia.  .     Data Reviewed: Care during the described time interval was provided by me .  I have reviewed this patient's available data, including medical history, events of note, physical examination, and all test results as part of my evaluation. I have personally reviewed and interpreted all radiology studies.  CBC:  Recent Labs Lab 07/17/15 0500  07/18/15 0215 07/19/15 0400  07/19/15 1740 07/19/15 2345 07/20/15 0555 07/21/15 0231 07/22/15 0309  WBC 5.7  < > 5.6 5.3  --   --   --  7.1 8.0 10.0  NEUTROABS 4.8  --   --   --   --   --   --   --   --   --   HGB 7.1*  < > 8.6* 8.0*  < > 8.7* 8.5* 8.3* 9.5* 9.0*  HCT 21.7*  < > 25.5* 24.7*  < > 26.3* 26.1* 25.8* 29.6* 28.3*  MCV 89.3  < > 88.2 90.1  --   --   --  91.8 91.9 91.6  PLT 421*  < > 318 290  --   --   --  298 327 356  < > = values in this interval not displayed. Basic Metabolic Panel:  Recent Labs Lab 07/17/15 0500 07/17/15 0518 07/17/15 1034 07/17/15 1600 07/18/15 0215 07/19/15 0400 07/21/15 0231  NA 129* 129* 131*  --  135 137 142  K 4.9 4.9 4.4  --  3.9 4.1 4.6  CL 95* 96* 106  --  110 113* 116*  CO2 17*  --  10*  --  14* 15* 17*  GLUCOSE 213* 207* 157*  --  118* 156* 128*  BUN 97* 90* 82*  --  48* 27* 31*  CREATININE 2.47* 2.50* 1.99*  --  1.30* 1.11 1.03  CALCIUM 8.7*  --  7.0*  --  7.8* 7.9* 8.4*  MG  --   --   --  2.1  --   --   --   PHOS  --   --   --  4.8*  --   --   --      GFR: Estimated Creatinine Clearance: 72.2 mL/min (by C-G formula based on Cr of 1.03). Liver Function Tests:  Recent Labs Lab 07/17/15 0500 07/18/15 0215  AST 25 27  ALT 13* 13*  ALKPHOS 57 47  BILITOT 0.7 1.3*  PROT 6.6 5.0*  ALBUMIN 3.2* 2.3*   No results for input(s): LIPASE, AMYLASE in the last 168 hours. No results for input(s): AMMONIA in the last 168 hours. Coagulation Profile:  Recent Labs Lab 07/17/15 0500 07/17/15 1034 07/21/15 0231  INR 5.81* 1.24 1.29   Cardiac Enzymes:  Recent Labs Lab 07/17/15 0723 07/17/15 1600 07/17/15 2000 07/18/15 0215  CKTOTAL 475*  --   --   --   TROPONINI  --  0.06* 0.10* 0.14*   BNP (last 3 results) No results for input(s): PROBNP in the last 8760 hours. HbA1C: No results for input(s): HGBA1C in the last 72 hours. CBG:  Recent Labs Lab 07/22/15 1946 07/22/15 2350 07/23/15 0407 07/23/15 0732 07/23/15 1147  GLUCAP 137* 107* 126* 123* 134*   Lipid Profile: No results for input(s): CHOL, HDL, LDLCALC, TRIG, CHOLHDL, LDLDIRECT in the last 72 hours. Thyroid Function Tests: No results for input(s): TSH, T4TOTAL, FREET4, T3FREE, THYROIDAB in the last 72 hours. Anemia Panel: No results for input(s): VITAMINB12, FOLATE, FERRITIN, TIBC, IRON, RETICCTPCT in the last 72 hours. Urine analysis:    Component Value Date/Time   COLORURINE YELLOW 07/17/2015 0655   APPEARANCEUR CLOUDY* 07/17/2015 0655   LABSPEC 1.021 07/17/2015 0655   PHURINE 5.0 07/17/2015 0655   GLUCOSEU NEGATIVE 07/17/2015 0655   HGBUR NEGATIVE 07/17/2015 0655   BILIRUBINUR NEGATIVE 07/17/2015 0655   KETONESUR NEGATIVE 07/17/2015 0655   PROTEINUR NEGATIVE 07/17/2015 0655   UROBILINOGEN 1.0 12/20/2014 1110   NITRITE NEGATIVE 07/17/2015 0655   LEUKOCYTESUR NEGATIVE 07/17/2015 0655   Sepsis Labs: @LABRCNTIP (procalcitonin:4,lacticidven:4)  ) Recent Results (from the past 240 hour(s))  Blood Culture (routine x 2)     Status: None   Collection Time:  07/17/15  5:08 AM  Result Value Ref Range Status   Specimen Description BLOOD RIGHT ANTECUBITAL  Final   Special Requests BOTTLES DRAWN AEROBIC AND ANAEROBIC 5ML  Final   Culture NO GROWTH 5 DAYS  Final   Report Status 07/22/2015 FINAL  Final  Blood Culture (routine x 2)     Status: None   Collection Time: 07/17/15  5:30 AM  Result  Value Ref Range Status   Specimen Description BLOOD LEFT ANTECUBITAL  Final   Special Requests BOTTLES DRAWN AEROBIC AND ANAEROBIC 4ML  Final   Culture NO GROWTH 5 DAYS  Final   Report Status 07/22/2015 FINAL  Final  Urine culture     Status: None   Collection Time: 07/17/15  6:55 AM  Result Value Ref Range Status   Specimen Description URINE, CATHETERIZED  Final   Special Requests NONE  Final   Culture NO GROWTH  Final   Report Status 07/18/2015 FINAL  Final  MRSA PCR Screening     Status: None   Collection Time: 07/17/15  4:00 PM  Result Value Ref Range Status   MRSA by PCR NEGATIVE NEGATIVE Final    Comment:        The GeneXpert MRSA Assay (FDA approved for NASAL specimens only), is one component of a comprehensive MRSA colonization surveillance program. It is not intended to diagnose MRSA infection nor to guide or monitor treatment for MRSA infections.          Radiology Studies: Ir Ivc Filter Plmt / S&i /img Guid/mod Sed  07/21/2015  CLINICAL DATA:  Pulmonary emboli. GI bleed on anticoagulation, a relative contraindication. Caval filtration is requested. Circumaortic left renal vein on previous CT. EXAM: INFERIOR VENACAVOGRAM IVC FILTER PLACEMENT UNDER FLUOROSCOPY FLUOROSCOPY TIME:  1.5 minute, 188 uGym2 DAP TECHNIQUE: The procedure, risks (including but not limited to bleeding, infection, organ damage ), benefits, and alternatives were explained to the patient. Questions regarding the procedure were encouraged and answered. The patient understands and consents to the procedure. Patency of the right IJ vein was confirmed with ultrasound with  image documentation. An appropriate skin site was determined. Skin site was marked, prepped with chlorhexidine, and draped using maximum barrier technique. The region was infiltrated locally with 1% lidocaine. Intravenous Fentanyl and Versed were administered as conscious sedation during continuous monitoring of the patient's level of consciousness and physiological / cardiorespiratory status by the radiology RN, with a total moderate sedation time of 9 minutes. Under real-time ultrasound guidance, the right IJ vein was accessed with a 21 gauge micropuncture needle; the needle tip within the vein was confirmed with ultrasound image documentation. The needle was exchanged over a 018 guidewire for a transitional dilator, which allow advancement of the [facility] wire into the IVC. A long 6 French vascular sheath was placed for inferior venacavography. This demonstrated no caval thrombus. Renal vein inflows were evident. The [facility] IVC filter was advanced through the sheath and successfully deployed under fluoroscopy at the L3-4 level. Followup cavagram demonstrates stable filter position and no evident complication. The sheath was removed and hemostasis achieved at the site. No immediate complication. IMPRESSION: 1. Normal IVC. No thrombus or significant anatomic variation. 2. Technically successful infrarenal IVC filter placement. This is a retrievable model. Electronically Signed   By: Lucrezia Europe M.D.   On: 07/21/2015 16:25        Scheduled Meds: . antiseptic oral rinse  7 mL Mouth Rinse q12n4p  . capecitabine  1,000 mg Oral BID PC  . chlorhexidine  15 mL Mouth Rinse BID  . ferrous sulfate  325 mg Oral BID WC  . [START ON 99991111 folic acid  1 mg Oral Daily  . insulin aspart  0-9 Units Subcutaneous Q4H  . metoprolol  2.5 mg Intravenous Q4H  . sodium chloride flush  3 mL Intravenous Q12H  . thiamine  100 mg Oral Daily   Continuous Infusions: . sodium  chloride 50 mL/hr at 07/23/15 0800  .  phenylephrine (NEO-SYNEPHRINE) Adult infusion Stopped (07/18/15 1230)     LOS: 6 days    Time spent: 40 minutes    Raevyn Sokol, Geraldo Docker, MD [facility] Pager [phone]   If 7PM-7AM, please contact night-coverage [email or web address] Password [email or web address] 07/23/2015, 3:05 PM

## 2015-07-23 NOTE — Clinical Social Work Note (Signed)
Clinical Social Work Assessment  Patient Details  Name: Patrick [name]. MRN: [id number] Date of Birth: [birth year]  Date of referral:  07/23/15               Reason for consult:  Discharge Planning                Permission sought to share information with:  Chartered certified accountant granted to share information::  Yes, Verbal Permission Granted  Name::     Company secretary::  SNFs  Relationship::  Daughter  Contact Information:     Housing/Transportation Living arrangements for the past 2 months:  [place] of Information:  Patient, Adult Children Patient Interpreter Needed:  None Criminal Activity/Legal Involvement Pertinent to Current Situation/Hospitalization:  No - Comment as needed Significant Relationships:  Adult Children Lives with:  Adult Children Do you feel safe going back to the place where you live?  Yes Need for family participation in patient care:  No (Coment)  Care giving concerns:  The patient is aggregable for short term rehab at discharge. Patient would like to rebuild his strength to return home.    Social Worker assessment / plan: CSW met with patient at beside to complete assessment on 0/19/2017. Patient was resting comfortably at bedside. Patient's daughter was also at bedside. CSW explained that  PT recommendation were not in place for the patient yet. Per request of patient and patient's daughter CSW explained SNF search and placement process to the patient and patient's daughter and answered their questions. CSW explained once PT made recommendations  CSW would start SNF search and placement process. CSW will follow up with bed offers.    Employment status:  Retired Forensic scientist:  [employer] PT Recommendations:  No Name / Referral to community resources:  [place]  Patient/Family's Response to care:  The patient appears happy with the care he is receiving in hospital  and is appreciative of CSW assistance.  Patient/Family's Understanding of and Emotional Response to Diagnosis, Current Treatment, and Prognosis:  The patient has a good understanding of why he was admitted. He  understands the care plan and what he will need post discharge.  Emotional Assessment Appearance:  Appears stated age Attitude/Demeanor/Rapport:   (Patient was welcoming of CSW and appropriate. ) Affect (typically observed):  Calm, Appropriate, Accepting Orientation:  Oriented to Self, Oriented to Place, Oriented to  Time, Oriented to Situation Alcohol / Substance use:  Not Applicable Psych involvement (Current and /or in the community):  No (Comment)  Discharge Needs  Concerns to be addressed:  Discharge Planning Concerns Readmission within the last 30 days:  No Current discharge risk:  Physical Impairment Barriers to Discharge:  Continued Medical Work up   [employer], LCSW 07/23/2015, 3:34 PM

## 2015-07-23 NOTE — Progress Notes (Signed)
07/23/2015 1515 Dr. Sherral Hammers at bedside and made aware of inability to keep pt. HR less than 115. Pt. HR 120-140 despite scheduled metoprolol administration. BP 115/78. MD to review MAR and place orders as needed. Will await further orders and continue to closely monitor patient.  Muaad Boehning, Arville Lime

## 2015-07-23 NOTE — Progress Notes (Signed)
Palliative Medicine RN Note: Per request of PA Imogene Burn, called [facility] to notify of Palliative Care needs after d/c. Spoke with Verdis Frederickson, who will follow pt's hospital course.  Larina Earthly, RN, BSN, [facility] 07/23/2015 9:45 AM Cell [phone] 8:00-4:00 Monday-Friday Office [phone]

## 2015-07-23 NOTE — Evaluation (Addendum)
Physical Therapy Evaluation Patient Details Name: Patrick [name]. MRN: [id number] DOB: 06-Oct-1945 Today's Date: 07/23/2015   History of Present Illness  70 y.o. male with past medical history of invasive adenocarcinoma of the stomach followed by Dr. Alen Blew, DMII, stroke, and afib. - He is unable to take coumadin or any anticoagulation due to GI bleeding from his gastric cancer. He was admitted on 07/17/2015 with SOB, hematemesis and hypotension. He was found to have bilateral pulmonary embolism, and likely partial gastric outlet obstruction.  Clinical Impression  Pt admitted with above diagnosis. Pt currently with functional limitations due to the deficits listed below (see PT Problem List). Pt was able to ambulate with min assist of 2 with chair follow for safety with pt limited due to high HR up to 178 bpm with activity.  Will follow acutely and progress pt as able.  Pt will most likely need SNF for therapy prior to d/c home.  Pt will benefit from skilled PT to increase their independence and safety with mobility to allow discharge to the venue listed below.      Follow Up Recommendations SNF;Supervision/Assistance - 24 hour    Equipment Recommendations  Other (comment) (TBA)    Recommendations for Other Services       Precautions / Restrictions Precautions Precautions: Fall (Incr HR) Restrictions Weight Bearing Restrictions: No      Mobility  Bed Mobility               General bed mobility comments: NT in chair on arrival  Transfers Overall transfer level: Needs assistance Equipment used: Rolling walker (2 wheeled) Transfers: Sit to/from [facility] Sit to Stand: Mod assist;+2 physical assistance Stand pivot transfers: Min assist;+2 physical assistance       General transfer comment: Pt needed assist to power up.  Pt was able to take pivotal steps to 3N1.  Pt had BM and was total assist to clean pt.    Ambulation/Gait Ambulation/Gait  assistance: Min assist;+2 safety/equipment Ambulation Distance (Feet): 4 Feet Assistive device: Rolling walker (2 wheeled) Gait Pattern/deviations: Step-through pattern;Decreased stride length;Trunk flexed;Wide base of support;Antalgic   Gait velocity interpretation: Below normal speed for age/gender General Gait Details: Pt took about 4 steps and HR incr to 178 bpm with chair follow therefore sat pt down to rest due to high HR.  Pt is able to balance with RW.  Limited by high HR. Needed assist to control descent into chair.   Stairs            Wheelchair Mobility    Modified Rankin (Stroke Patients Only)       Balance Overall balance assessment: Needs assistance Sitting-balance support: Feet supported;Bilateral upper extremity supported Sitting balance-Leahy Scale: Poor Sitting balance - Comments: relies on UE support   Standing balance support: Bilateral upper extremity supported;During functional activity Standing balance-Leahy Scale: Poor Standing balance comment: Relies on UE support                             Pertinent Vitals/Pain Pain Assessment: No/denies pain  HR 145 bpm initially.  Once on 3N1 up to 166 bpm but returned to 140's.  Then up to 178 bpm when pt walked a few steps.  Pt sat down in chair with treatment terminated due to HR too high.  Noted HR to 181 bpm with pt sitting at rest after 5 min of rest.  Nursing aware.  Pt Hr back to 140's on departure.  O2 86-94% on 2LO2 at rest.  Incr O2 to 4L with activity to keep sats >90%.  Placed back on 2LO2 upon departure with sats >90%.  BP 106/66.     Home Living Family/patient expects to be discharged to:: Private residence Living Arrangements: Children Available Help at Discharge: Family;Available 24 hours/day Type of Home: Apartment Home Access: Level entry     Home Layout: Two level Home Equipment: Cane - single point      Prior Function Level of Independence: Independent with assistive  device(s) (used cane)         Comments: independent with ADLs, cooking, laundry and was driving     Hand Dominance   Dominant Hand: Right    Extremity/Trunk Assessment   Upper Extremity Assessment: Defer to OT evaluation           Lower Extremity Assessment: Generalized weakness      Cervical / Trunk Assessment: Kyphotic  Communication   Communication:  (Difficult to understand pt speech, speaks sofly and garbled)  Cognition Arousal/Alertness: Awake/alert Behavior During Therapy: Flat affect Overall Cognitive Status: Within Functional Limits for tasks assessed                      General Comments      Exercises General Exercises - Lower Extremity Ankle Circles/Pumps: AROM;Both;10 reps;Seated Long Arc Quad: AROM;Both;10 reps;Seated Hip Flexion/Marching: AROM;Both;10 reps;Seated      Assessment/Plan    PT Assessment Patient needs continued PT services  PT Diagnosis Generalized weakness   PT Problem List Decreased activity tolerance;Decreased balance;Decreased mobility;Decreased knowledge of use of DME;Decreased safety awareness;Decreased knowledge of precautions;Cardiopulmonary status limiting activity;Decreased strength  PT Treatment Interventions DME instruction;Gait training;Functional mobility training;Therapeutic activities;Therapeutic exercise;Balance training;Patient/family education;Stair training   PT Goals (Current goals can be found in the Care Plan section) Acute Rehab PT Goals Patient Stated Goal: to go to SNF for therapy and then home with son with Hospice PT Goal Formulation: With patient Time For Goal Achievement: 08/06/15 Potential to Achieve Goals: Good    Frequency Min 3X/week   Barriers to discharge        Co-evaluation               End of Session Equipment Utilized During Treatment: Gait belt;Oxygen Activity Tolerance: Patient limited by fatigue Patient left: in chair;with call bell/phone within reach Nurse  Communication: Mobility status         Time: 0830-0905 PT Time Calculation (min) (ACUTE ONLY): 35 min   Charges:   PT Evaluation $PT Eval Moderate Complexity: 1 Procedure PT Treatments $Gait Training: 8-22 mins   PT G CodesIrwin Brakeman F 08/04/2015, 11:50 AM Brice Potteiger,PT Acute Rehabilitation [phone] (pager)

## 2015-07-23 NOTE — Progress Notes (Addendum)
OT Cancellation Note  Patient Details Name: Patrick [name]. MRN: [id number] DOB: [birth year]   Cancelled Treatment:    Reason Eval/Treat Not Completed: Medical issues which prohibited therapy - elevated HR.  Pt sustaining in the 130s-140s and increased to 170s during activity with PT.  Will defer OT eval today.    Darlina Rumpf Blaine, OTR/L [id number]  07/23/2015, 2:54 PM

## 2015-07-23 NOTE — Progress Notes (Signed)
Kathline Magic notified of HR sustaining 130s-160s BP 118/84 despite 2.5 Lopressor and newly ordered 0.125 Digoxin. Orders received for an additional 2.5 Lopressor. Will continue to monitor.

## 2015-07-23 NOTE — NC FL2 (Signed)
[place] LEVEL OF CARE SCREENING TOOL     IDENTIFICATION  Patient Name: Patrick [name]. Birthdate: 1945-12-19 Sex: male Admission Date (Current Location): 07/17/2015  [facility] [place] Number:  Herbalist and Address:  [facility] [place], [place] [address] [place], [place] [address]      Provider Number: [id number]  Attending Physician Name and Address:  Allie Bossier, MD  Relative Name and Phone Number:       Current Level of Care: Hospital Recommended Level of Care: [place] Prior Approval Number:    Date Approved/Denied:   PASRR Number: [id number]  Discharge Plan: SNF    Current Diagnoses: Patient Active Problem List   Diagnosis Date Noted  . Hiccups   . Encounter for hospice care discussion   . Venous embolism and thrombosis of superficial vessels of lower extremity   . Controlled diabetes mellitus type 2 with complications ([place])   . Gastric adenocarcinoma ([place])   . Advanced directives, counseling/discussion   . Absolute anemia   . Goals of care, counseling/discussion   . Palliative care encounter   . Partial gastric outlet obstruction   . Hypotension 07/17/2015  . Bilateral pulmonary embolism ([place]) 07/17/2015  . Dehydration with hyponatremia 07/17/2015  . Pulmonary embolism ([place]) 07/17/2015  . QT prolongation   . Anemia associated with acute blood loss 01/12/2015  . Acute kidney injury ([place])   . Upper GI bleed   . Coagulopathy ([place])   . Bleeding gastrointestinal   . GI bleed 12/20/2014  . Diabetes mellitus type II, non insulin dependent ([place]) 12/20/2014  . Acute blood loss anemia 12/20/2014  . 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 Iron deficiency anemia secondary to blood loss (chronic) 10/30/2013  . 110.1 03/03/2013  . Dyslipidemia, goal LDL below 130 - on statin; followed by PCP 11/21/2012  . Chronic atrial fibrillation ([place]) 05/23/2012  . Essential hypertension  09/28/2010  . Diabetes mellitus type II, uncontrolled ([place]) 09/28/2010  . Stroke ([place]) 09/28/2010    Orientation RESPIRATION BLADDER Height & Weight     Self, Time, Situation, Place  O2 Continent Weight: 173 lb 8 oz (78.7 kg) Height:  6' (182.9 cm)  BEHAVIORAL SYMPTOMS/MOOD NEUROLOGICAL BOWEL NUTRITION STATUS   (None)  (None) Continent Diet, Feeding tube (Full Liquid )  AMBULATORY STATUS COMMUNICATION OF NEEDS Skin   Extensive Assist Verbally Normal                       Personal Care Assistance Level of Assistance  Bathing, Feeding, Dressing Bathing Assistance: Limited assistance Feeding assistance: Independent Dressing Assistance: Limited assistance     Functional Limitations Info  Sight, Hearing, Speech Sight Info: Adequate Hearing Info: Adequate Speech Info: Adequate    SPECIAL CARE FACTORS FREQUENCY  PT (By licensed PT)     PT Frequency: 5/ week OT Frequency: 5/ week            Contractures Contractures Info: Not present    Additional Factors Info  Code Status, Insulin Sliding Scale Code Status Info: Partial      Insulin Sliding Scale Info: Novolog 0-9 Units every 4 hours       Current Medications (07/23/2015):  This is the current hospital active medication list Current Facility-Administered Medications  Medication Dose Route Frequency Provider Last Rate Last Dose  . 0.9 %  sodium chloride infusion   Intravenous Continuous [place], MD 50 mL/hr at  07/23/15 0800    . acetaminophen (TYLENOL) tablet 650 mg  650 mg Oral Q6H PRN Samella Parr, NP   650 mg at 07/22/15 0804   Or  . acetaminophen (TYLENOL) suppository 650 mg  650 mg Rectal Q6H PRN Samella Parr, NP      . antiseptic oral rinse (BIOTENE) solution 15 mL  15 mL Mouth Rinse PRN Melton Alar, PA-C      . antiseptic oral rinse (CPC / CETYLPYRIDINIUM CHLORIDE 0.05%) solution 7 mL  7 mL Mouth Rinse q12n4p Rush Farmer, MD   7 mL at 07/23/15 1600  . capecitabine (XELODA)  tablet 1,000 mg  1,000 mg Oral BID PC Allie Bossier, MD   1,000 mg at 07/23/15 1010  . chlorhexidine (PERIDEX) 0.12 % solution 15 mL  15 mL Mouth Rinse BID Rush Farmer, MD   15 mL at 07/23/15 0934  . chlorproMAZINE (THORAZINE) 25 mg in sodium chloride 0.9 % 25 mL IVPB  25 mg Intravenous Q8H PRN Melton Alar, PA-C   25 mg at 07/22/15 1815  . ferrous sulfate tablet 325 mg  325 mg Oral BID WC Samella Parr, NP   325 mg at 07/23/15 0921  . [START ON 99991111 folic acid (FOLVITE) tablet 1 mg  1 mg Oral Daily Lauren D Bajbus, RPH      . insulin aspart (novoLOG) injection 0-9 Units  0-9 Units Subcutaneous Q4H Samella Parr, NP   1 Units at 07/23/15 1156  . metoprolol (LOPRESSOR) injection 2.5 mg  2.5 mg Intravenous Q4H Allie Bossier, MD   2.5 mg at 07/23/15 1539  . phenylephrine (NEO-SYNEPHRINE) 40 mg in dextrose 5 % 250 mL (0.16 mg/mL) infusion  0-400 mcg/min Intravenous Titrated Allie Bossier, MD   Stopped at 07/18/15 1230  . prochlorperazine (COMPAZINE) injection 5 mg  5 mg Intravenous Q6H PRN Chesley Mires, MD   5 mg at 07/18/15 2102  . sodium chloride flush (NS) 0.9 % injection 3 mL  3 mL Intravenous Q12H Samella Parr, NP   3 mL at 07/23/15 0933  . thiamine (VITAMIN B-1) tablet 100 mg  100 mg Oral Daily Samella Parr, NP   100 mg at 07/23/15 0920     Discharge Medications: Please see discharge summary for a list of discharge medications.  Relevant Imaging Results:  Relevant Lab Results:   Additional Information SSN: [id number]  Samule Dry, LCSW

## 2015-07-24 DIAGNOSIS — I959 Hypotension, unspecified: Secondary | ICD-10-CM | POA: Diagnosis present

## 2015-07-24 LAB — CBC WITH DIFFERENTIAL/PLATELET
BASOS PCT: 0 %
Basophils Absolute: 0 10*3/uL (ref 0.0–0.1)
EOS PCT: 0 %
Eosinophils Absolute: 0 10*3/uL (ref 0.0–0.7)
HEMATOCRIT: 30.6 % — AB (ref 39.0–52.0)
Hemoglobin: 9.7 g/dL — ABNORMAL LOW (ref 13.0–17.0)
LYMPHS ABS: 0.4 10*3/uL — AB (ref 0.7–4.0)
Lymphocytes Relative: 3 %
MCH: 29 pg (ref 26.0–34.0)
MCHC: 31.7 g/dL (ref 30.0–36.0)
MCV: 91.3 fL (ref 78.0–100.0)
MONOS PCT: 9 %
Monocytes Absolute: 1.1 10*3/uL — ABNORMAL HIGH (ref 0.1–1.0)
NEUTROS ABS: 10.2 10*3/uL — AB (ref 1.7–7.7)
Neutrophils Relative %: 88 %
Platelets: 308 10*3/uL (ref 150–400)
RBC: 3.35 MIL/uL — AB (ref 4.22–5.81)
RDW: 22.1 % — AB (ref 11.5–15.5)
WBC: 11.7 10*3/uL — AB (ref 4.0–10.5)

## 2015-07-24 LAB — GLUCOSE, CAPILLARY
GLUCOSE-CAPILLARY: 114 mg/dL — AB (ref 65–99)
Glucose-Capillary: 108 mg/dL — ABNORMAL HIGH (ref 65–99)
Glucose-Capillary: 111 mg/dL — ABNORMAL HIGH (ref 65–99)
Glucose-Capillary: 111 mg/dL — ABNORMAL HIGH (ref 65–99)
Glucose-Capillary: 119 mg/dL — ABNORMAL HIGH (ref 65–99)
Glucose-Capillary: 98 mg/dL (ref 65–99)

## 2015-07-24 LAB — BASIC METABOLIC PANEL
ANION GAP: 12 (ref 5–15)
BUN: 26 mg/dL — AB (ref 6–20)
CALCIUM: 8.5 mg/dL — AB (ref 8.9–10.3)
CO2: 18 mmol/L — ABNORMAL LOW (ref 22–32)
Chloride: 115 mmol/L — ABNORMAL HIGH (ref 101–111)
Creatinine, Ser: 1.08 mg/dL (ref 0.61–1.24)
GFR calc Af Amer: >60 mL/min (ref >60)
GLUCOSE: 119 mg/dL — AB (ref 65–99)
Potassium: 5.2 mmol/L — ABNORMAL HIGH (ref 3.5–5.1)
Sodium: 145 mmol/L (ref 135–145)

## 2015-07-24 LAB — MAGNESIUM: Magnesium: 2.1 mg/dL (ref 1.7–2.4)

## 2015-07-24 MED ORDER — DIGOXIN 0.25 MG/ML IJ SOLN
0.1250 mg | Freq: Two times a day (BID) | INTRAMUSCULAR | Status: DC
  Administered 2015-07-24 – 2015-07-25 (×3): 0.125 mg via INTRAVENOUS
  Filled 2015-07-24 (×3): qty 2

## 2015-07-24 NOTE — Progress Notes (Addendum)
Patient feeling very nauseous and abdomen distended. Hooked NG tube to low intermittent suction and got out 900 mL. A few hours later patient vomited a very large amount of brown bile up suddenly. NG tube hooked back up to suction and 600 mL of brown bile out within 5 minutes. Kathline Magic with [facility] notified. Will leave patient NPO and NGT to suction until MD to assess in am.

## 2015-07-24 NOTE — Progress Notes (Signed)
PROGRESS NOTE    Patrick [name].  [id number] DOB: Jul 11, 1945 DOA: 07/17/2015 PCP: Thressa Sheller, MD   Brief Narrative:  70 y.o. WM PMHx Gastric Adenocarcinoma status post radiation therapy on palliative Xeloda, HTN, Diabetes type 2 on Metformin, iron Deficiency Anemia with baseline hemoglobin around 9 in the setting of ongoing chronic blood loss, Chronic Atrial Fibrillation not an anticoagulation secondary to the above issues, HTN. Patient presented to the ER with reports of shortness of breath.  Upon arrival to the ER patient was hypotensive with a systolic blood pressure in the 70s. ER physician was concerned in the setting of malignancy patient may have PE given presenting symptoms with associated dyspnea so a bedside quick look echocardiogram was completed that was concerning for RV strain. Labs also revealed acute kidney injury. EDP discussed with radiologist who recommended a low dye load CT angiogram of the chest to evaluate for PE. Initial lab data revealed an unexplained coagulopathy with an INR greater than 5 without overt signs of bleeding and normal platelet count. Because of this empiric heparin was placed on hold until CT of the chest could be completed.  Upon my evaluation of the patient he denied issues such as nausea vomiting diarrhea and inability to eat, any postprandial bloating or abdominal pain. He reports for the past 2 days he's been slightly more short of breath than usual not definitely correlated with activity. He developed sudden shortness of breath early this morning without chest pain and had not noticed any heart racing. He has not had any blood or dark stools.  ED Course:  Afebrile-initial blood pressure 75/46 -MAP 54-pulse 82-respirations 19-room air saturations at rest 100%; patient comfort ER staff placed patient on 2 L nasal cannula Repeat vital signs after fluid challenge: BP 94/64 with an MAP of 74-pulse 109-respirations 27 CT angiogram of the chest  with limited contrast medium: Portable chest x-ray: Unremarkable Lab data: Sodium 129, chloride 95, CO2 17, BUN 97, creatinine 2.47, glucose 213, anion gap 17, LFTs are normal except for slightly low ALT of 13, BNP 314, troponin 0.03, lactic acid 1.35, WBC 5700 with neutrophils 84% and absolute neutrophils 4.8%, hemoglobin 7.1, platelets 421,000; ABG with mild acidosis pH 7.32 with normal PCO2 of 28.4, PO2 normal at 86 bicarbonate 14.7 and acid base deficit [id number] c/w with metabolic acidemia, urinalysis cloudy without evidence of proteinuria or infection and slightly elevated specific gravity of 1.021, urine culture and blood cultures have been obtained in the ER; repeat coags and DIC panel is pending A total of or liters normal saline bolus has been infused while in the ER Heparin bolus was given 3000 units but infusion placed on hold given coagulopathy until diagnosis of PE confirmed   Assessment & Plan:   Principal Problem:   Bilateral pulmonary embolism (HCC) Active Problems:   Essential hypertension   Diabetes mellitus type II, uncontrolled (HCC)   Chronic atrial fibrillation (HCC)   Iron deficiency anemia secondary to blood loss (chronic)   TxN1 adenocarcinoma of the gastric antrum   Coagulopathy (HCC)   Acute kidney injury ([place])   Hypotension   Dehydration with hyponatremia   Pulmonary embolism (HCC)   Goals of care, counseling/discussion   Palliative care encounter   Partial gastric outlet obstruction   Advanced directives, counseling/discussion   Absolute anemia   Hiccups   Encounter for hospice care discussion   Venous embolism and thrombosis of superficial vessels of lower extremity   Controlled diabetes mellitus type 2 with complications ([place])  Gastric adenocarcinoma (HCC)   Paroxysmal atrial fibrillation (HCC)   Bilateral Pulmonary Embolism/Hypotension -Patient presented with dyspnea and hypotension with quick look bedside ECHO concerning for RV strain; CT angiogram of  the chest confirmed bilateral pulmonary emboli with moderate clot burden -In setting of initial unexplained coagulopathy I discussed with patient's oncologist Dr. Alen Blew who agrees appropriate to utilize IV heparin  -Bilateral Doppler; positive left SVT see results below -5/17 S/P placement of IVC filter  Left lower extremity SVT -Left positive SVT perforator vein mid calf. -See PE  Cardiomyopathy/ Pulmonary hypertension/Sinus tachycardia -Patient has sinus tachycardia with borderline BP, will try scheduled metoprolol. -Echocardiogram; cardiomyopathy with severe pulmonary hypertension see results below -Metoprolol IV 2.5 mg q 4hr  Essential Hypertension/Hypotension -BP continues to be borderline  -Continue to hold preadmission carvedilol, diltiazem ex RR, Lasix, lisinopril hydrochlorothiazide -See cardiomyopathy  Paroxysmal atrial fibrillation(CHADVASC = 5) -Initially presented with RVR and now sinus tachycardia -Negative anticoagulant n past to h/o GIB 2/2 gastric cancer - Patient still not rate controlled A. fib RVR--> sinus tachycardia HR 140+ -Continued relative hypotension; Digoxin IV 0.125 mg BID -5/20 slight improvement in A. fib but still not optimal -Check digoxin level on 5/21  QT prolongation -In the setting of acute PE. Resolved   Coagulopathy  -INR normalized unfortunately did not help with patient's GI bleeding. -5/13 transfused 2 units RBC -Monitor for GI bleeding symptoms given history (see below) -Because of underlying gastric cancer with history of ongoing occult GIB was not  candidate for thrombolytic therapy  -Heparin DC'd and filter placed on 5/17--> GI bleed -Avoid all blood thinning medications, as patient has not tolerated   Acute kidney injury  -Patient's BUN and creatinine nearly double baseline of 20/1.1 in March 2017 -Patient was developing azotemia and April 2017 with BUN up to 54 and creatinine 1.6 noting he was on thiazide diuretics and Lasix  prior to admission; held secondary to hypotension Lab Results  Component Value Date   CREATININE 0.97 07/23/2015   CREATININE 1.03 07/21/2015   CREATININE 1.11 07/19/2015  -Resolved  Iron deficiency anemia secondary to blood loss (chronic)(Baseline hemoglobin~9.5 -11)  -Of note also has a history of chronic iron deficiency and per oncology notes has responded to IV iron in the past -check anemia panel  Recent Labs Lab 07/20/15 0555 07/21/15 0231 07/22/15 0309 07/23/15 1807 07/24/15 0241  HGB 8.3* 9.5* 9.0* 9.9* 9.7*  -Hemoglobin slowly improving   TxN1 adenocarcinoma of the gastric antrum -Last PET scan January 2017 mild progression without widespread disease and stable on Xeloda for palliative purposes -Last evaluated by oncology 06/17/15 -Restart Xeloda 1000 mg BID in A.m. (patient has brought his own medication) -PT/OT consult pending; patient deconditioned from metastatic cancer, and PE.  Gastric outlet obstruction -See CTA -5/20 Patient continues to have emesis with a liquid diet secondary to obstruction. Spoke with IR did not believe he could safely advanced NG tube.   -[facility] for more permanent solution next week. PEG? Appears was last seen by him on 01/13/2015    Diabetes mellitus type II, Controlled with complication -Holding metformin the setting of acute kidney injury and presumed ATN -5/13 Hemoglobin A1c= 6.6  -Sensitive SSI   Goals of care -Per patient son and daughter-in-law were given list of SNF and are currently evaluating. Speak with NCM Blain Pais and NP Haynes Dage (palliative care) on 5/20      DVT prophylaxis: SCD Code Status: Partial Family Communication: None present Disposition Plan: Stabilization of PE and its  sequela; home hospice?   Consultants:  Dr. Mike Gip de [facility] [place]    Procedures/Significant Events:  5/13 transfused 2 units RBC 5/13 CTA chest;-Bilateral PE with a  moderate burden.-Enlarging node in the epicardial fat anterior to the Liver.-Findings known gastric cancer seen in the distal gastric body and antrum with worsening nodes upper abdomen. -Gastric distention suggesting partial gastric outlet obstruction. 5/13 echocardiogram;Left ventricle: moderate LVH.-LVEF= 60%-65%. - Ventricular septum: The contour showed diastolic flattening and systolic flattening. - Left atrium: moderately dilated.-- Right ventricle: moderately dilated.  - Right atrium: moderately dilated.--Tricuspid valve: moderate-severe regurgitation. - Pulmonary arteries:  PA peak pressure: 77 mm Hg (S). 5/13 bilateral lower extremity Doppler; negative right DVT/SVT. Left positive SVT perforator vein mid calf.  5/17 IVC filter placed, infrarenal, retrievable   Cultures BCx2 5/13>>> (-) Urine 5/13>>> No Growth  Antimicrobials: vanc 5/13>>> 5/15 Zosyn 5/13>>> 5/15   Devices    LINES / TUBES:  PIV x 2    Continuous Infusions: . sodium chloride 50 mL/hr at 07/24/15 0530  . phenylephrine (NEO-SYNEPHRINE) Adult infusion Stopped (07/18/15 1230)     Subjective: 5/20 A/O 4, sitting in chair mild discomfort. Requiring intermittent - continuous suction today. Does not Tolerated full liquid diet.  .   Objective: Filed Vitals:   07/24/15 0325 07/24/15 0340 07/24/15 0400 07/24/15 0744  BP: 118/76  95/78 106/84  Pulse:    143  Temp:  97.4 F (36.3 C)  97.6 F (36.4 C)  TempSrc:  Oral  Oral  Resp: 22  32 24  Height:      Weight:   80.2 kg (176 lb 12.9 oz)   SpO2: 98%  94%     Intake/Output Summary (Last 24 hours) at 07/24/15 0846 Last data filed at 07/24/15 0700  Gross per 24 hour  Intake   1240 ml  Output   2275 ml  Net  -1035 ml   Filed Weights   07/21/15 0500 07/22/15 0500 07/24/15 0400  Weight: 78.3 kg (172 lb 9.9 oz) 78.7 kg (173 lb 8 oz) 80.2 kg (176 lb 12.9 oz)    Examination:  General: A/O 4, sitting in chair NAD. , No acute respiratory  distress Eyes: negative scleral hemorrhage, negative anisocoria, negative icterus ENT: Negative Runny nose, negative gingival bleeding, NG tube present right nare Neck:  Negative scars, masses, torticollis, lymphadenopathy, JVD Lungs: Clear to auscultation bilaterally without wheezes or crackles Cardiovascular: Sinus tachycardia, without murmur gallop or rub normal S1 and S2 Abdomen: negative abdominal pain, positive distended, hypoactive bowel sounds, no rebound, no ascites, no appreciable mass Extremities: No significant cyanosis, clubbing, or edema bilateral lower extremities Skin: Negative rashes, lesions, ulcers Psychiatric:  Negative depression, negative anxiety, negative fatigue, negative mania  Central nervous system:  Cranial nerves II through XII intact, tongue/uvula midline, all extremities muscle strength 5/5, sensation intact throughout,negative dysarthria, negative expressive aphasia, negative receptive aphasia.  .     Data Reviewed: Care during the described time interval was provided by me .  I have reviewed this patient's available data, including medical history, events of note, physical examination, and all test results as part of my evaluation. I have personally reviewed and interpreted all radiology studies.  CBC:  Recent Labs Lab 07/20/15 0555 07/21/15 0231 07/22/15 0309 07/23/15 1807 07/24/15 0241  WBC 7.1 8.0 10.0 11.8* 11.7*  NEUTROABS  --   --   --  10.3* 10.2*  HGB 8.3* 9.5* 9.0* 9.9* 9.7*  HCT  25.8* 29.6* 28.3* 32.1* 30.6*  MCV 91.8 91.9 91.6 92.2 91.3  PLT 298 327 356 336 [id number]   Basic Metabolic Panel:  Recent Labs Lab 07/17/15 1034 07/17/15 1600 07/18/15 0215 07/19/15 0400 07/21/15 0231 07/23/15 1807 07/24/15 0241  NA 131*  --  135 137 142 142  --   K 4.4  --  3.9 4.1 4.6 5.3*  --   CL 106  --  110 113* 116* 113*  --   CO2 10*  --  14* 15* 17* 16*  --   GLUCOSE 157*  --  118* 156* 128* 154*  --   BUN 82*  --  48* 27* 31* 32*  --    CREATININE 1.99*  --  1.30* 1.11 1.03 0.97  --   CALCIUM 7.0*  --  7.8* 7.9* 8.4* 8.6*  --   MG  --  2.1  --   --   --  2.3 2.1  PHOS  --  4.8*  --   --   --   --   --    GFR: Estimated Creatinine Clearance: 76.7 mL/min (by C-G formula based on Cr of 0.97). Liver Function Tests:  Recent Labs Lab 07/18/15 0215  AST 27  ALT 13*  ALKPHOS 47  BILITOT 1.3*  PROT 5.0*  ALBUMIN 2.3*   No results for input(s): LIPASE, AMYLASE in the last 168 hours. No results for input(s): AMMONIA in the last 168 hours. Coagulation Profile:  Recent Labs Lab 07/17/15 1034 07/21/15 0231  INR 1.24 1.29   Cardiac Enzymes:  Recent Labs Lab 07/17/15 1600 07/17/15 2000 07/18/15 0215  TROPONINI 0.06* 0.10* 0.14*   BNP (last 3 results) No results for input(s): PROBNP in the last 8760 hours. HbA1C: No results for input(s): HGBA1C in the last 72 hours. CBG:  Recent Labs Lab 07/23/15 1147 07/23/15 1550 07/23/15 2030 07/24/15 0031 07/24/15 0339  GLUCAP 134* 103* 135* 111* 108*   Lipid Profile: No results for input(s): CHOL, HDL, LDLCALC, TRIG, CHOLHDL, LDLDIRECT in the last 72 hours. Thyroid Function Tests: No results for input(s): TSH, T4TOTAL, FREET4, T3FREE, THYROIDAB in the last 72 hours. Anemia Panel: No results for input(s): VITAMINB12, FOLATE, FERRITIN, TIBC, IRON, RETICCTPCT in the last 72 hours. Urine analysis:    Component Value Date/Time   COLORURINE YELLOW 07/17/2015 0655   APPEARANCEUR CLOUDY* 07/17/2015 0655   LABSPEC 1.021 07/17/2015 0655   PHURINE 5.0 07/17/2015 0655   GLUCOSEU NEGATIVE 07/17/2015 0655   HGBUR NEGATIVE 07/17/2015 0655   BILIRUBINUR NEGATIVE 07/17/2015 0655   KETONESUR NEGATIVE 07/17/2015 0655   PROTEINUR NEGATIVE 07/17/2015 0655   UROBILINOGEN 1.0 12/20/2014 1110   NITRITE NEGATIVE 07/17/2015 0655   LEUKOCYTESUR NEGATIVE 07/17/2015 0655   Sepsis Labs: @LABRCNTIP (procalcitonin:4,lacticidven:4)  ) Recent Results (from the past 240 hour(s))   Blood Culture (routine x 2)     Status: None   Collection Time: 07/17/15  5:08 AM  Result Value Ref Range Status   Specimen Description BLOOD RIGHT ANTECUBITAL  Final   Special Requests BOTTLES DRAWN AEROBIC AND ANAEROBIC 5ML  Final   Culture NO GROWTH 5 DAYS  Final   Report Status 07/22/2015 FINAL  Final  Blood Culture (routine x 2)     Status: None   Collection Time: 07/17/15  5:30 AM  Result Value Ref Range Status   Specimen Description BLOOD LEFT ANTECUBITAL  Final   Special Requests BOTTLES DRAWN AEROBIC AND ANAEROBIC 4ML  Final   Culture NO GROWTH 5 DAYS  Final   Report Status 07/22/2015 FINAL  Final  Urine culture     Status: None   Collection Time: 07/17/15  6:55 AM  Result Value Ref Range Status   Specimen Description URINE, CATHETERIZED  Final   Special Requests NONE  Final   Culture NO GROWTH  Final   Report Status 07/18/2015 FINAL  Final  MRSA PCR Screening     Status: None   Collection Time: 07/17/15  4:00 PM  Result Value Ref Range Status   MRSA by PCR NEGATIVE NEGATIVE Final    Comment:        The GeneXpert MRSA Assay (FDA approved for NASAL specimens only), is one component of a comprehensive MRSA colonization surveillance program. It is not intended to diagnose MRSA infection nor to guide or monitor treatment for MRSA infections.          Radiology Studies: No results found.      Scheduled Meds: . antiseptic oral rinse  7 mL Mouth Rinse q12n4p  . capecitabine  1,000 mg Oral BID PC  . chlorhexidine  15 mL Mouth Rinse BID  . digoxin  0.125 mg Intravenous Daily  . ferrous sulfate  325 mg Oral BID WC  . folic acid  1 mg Oral Daily  . insulin aspart  0-9 Units Subcutaneous Q4H  . metoprolol  2.5 mg Intravenous Q4H  . sodium chloride flush  3 mL Intravenous Q12H  . thiamine  100 mg Oral Daily   Continuous Infusions: . sodium chloride 50 mL/hr at 07/24/15 0530  . phenylephrine (NEO-SYNEPHRINE) Adult infusion Stopped (07/18/15 1230)      LOS: 7 days    Time spent: 40 minutes    WOODS, Geraldo Docker, MD [facility] Pager [phone]   If 7PM-7AM, please contact night-coverage [email or web address] Password [email or web address] 07/24/2015, 8:46 AM

## 2015-07-25 DIAGNOSIS — R112 Nausea with vomiting, unspecified: Secondary | ICD-10-CM

## 2015-07-25 DIAGNOSIS — K311 Adult hypertrophic pyloric stenosis: Secondary | ICD-10-CM

## 2015-07-25 DIAGNOSIS — R066 Hiccough: Secondary | ICD-10-CM

## 2015-07-25 DIAGNOSIS — E43 Unspecified severe protein-calorie malnutrition: Secondary | ICD-10-CM

## 2015-07-25 DIAGNOSIS — I82409 Acute embolism and thrombosis of unspecified deep veins of unspecified lower extremity: Secondary | ICD-10-CM

## 2015-07-25 DIAGNOSIS — C169 Malignant neoplasm of stomach, unspecified: Secondary | ICD-10-CM

## 2015-07-25 DIAGNOSIS — I2609 Other pulmonary embolism with acute cor pulmonale: Secondary | ICD-10-CM | POA: Diagnosis present

## 2015-07-25 DIAGNOSIS — I82819 Embolism and thrombosis of superficial veins of unspecified lower extremities: Secondary | ICD-10-CM

## 2015-07-25 LAB — CBC WITH DIFFERENTIAL/PLATELET
BASOS PCT: 0 %
Basophils Absolute: 0 10*3/uL (ref 0.0–0.1)
EOS PCT: 0 %
Eosinophils Absolute: 0 10*3/uL (ref 0.0–0.7)
HEMATOCRIT: 31.2 % — AB (ref 39.0–52.0)
Hemoglobin: 9.6 g/dL — ABNORMAL LOW (ref 13.0–17.0)
LYMPHS ABS: 0.3 10*3/uL — AB (ref 0.7–4.0)
Lymphocytes Relative: 3 %
MCH: 29.1 pg (ref 26.0–34.0)
MCHC: 30.8 g/dL (ref 30.0–36.0)
MCV: 94.5 fL (ref 78.0–100.0)
MONO ABS: 0.7 10*3/uL (ref 0.1–1.0)
MONOS PCT: 6 %
Neutro Abs: 9.9 10*3/uL — ABNORMAL HIGH (ref 1.7–7.7)
Neutrophils Relative %: 91 %
Platelets: 287 10*3/uL (ref 150–400)
RBC: 3.3 MIL/uL — AB (ref 4.22–5.81)
RDW: 21.8 % — AB (ref 11.5–15.5)
WBC: 10.9 10*3/uL — AB (ref 4.0–10.5)

## 2015-07-25 LAB — GLUCOSE, CAPILLARY
GLUCOSE-CAPILLARY: 114 mg/dL — AB (ref 65–99)
GLUCOSE-CAPILLARY: 122 mg/dL — AB (ref 65–99)
GLUCOSE-CAPILLARY: 121 mg/dL — AB (ref 65–99)
Glucose-Capillary: 113 mg/dL — ABNORMAL HIGH (ref 65–99)
Glucose-Capillary: 116 mg/dL — ABNORMAL HIGH (ref 65–99)
Glucose-Capillary: 119 mg/dL — ABNORMAL HIGH (ref 65–99)
Glucose-Capillary: 121 mg/dL — ABNORMAL HIGH (ref 65–99)

## 2015-07-25 LAB — MAGNESIUM: Magnesium: 2.2 mg/dL (ref 1.7–2.4)

## 2015-07-25 LAB — DIGOXIN LEVEL: Digoxin Level: 0.5 ng/mL — ABNORMAL LOW (ref 0.8–2.0)

## 2015-07-25 MED ORDER — THIAMINE HCL 100 MG/ML IJ SOLN
100.0000 mg | Freq: Every day | INTRAMUSCULAR | Status: DC
  Administered 2015-07-25 – 2015-07-26 (×2): 100 mg via INTRAVENOUS
  Filled 2015-07-25 (×2): qty 2

## 2015-07-25 MED ORDER — DIGOXIN 0.25 MG/ML IJ SOLN: 0.1250 mg | Freq: Two times a day (BID) | INTRAMUSCULAR | Status: DC

## 2015-07-25 MED ORDER — FOLIC ACID 5 MG/ML IJ SOLN
1.0000 mg | Freq: Every day | INTRAMUSCULAR | Status: DC
  Administered 2015-07-26: 1 mg via INTRAVENOUS
  Filled 2015-07-25 (×2): qty 0.2

## 2015-07-25 MED ORDER — DIGOXIN 0.25 MG/ML IJ SOLN
0.2500 mg | Freq: Two times a day (BID) | INTRAMUSCULAR | Status: AC
Start: 2015-07-25 — End: 2015-07-26
  Administered 2015-07-25 – 2015-07-26 (×2): 0.25 mg via INTRAVENOUS
  Filled 2015-07-25 (×2): qty 2

## 2015-07-25 MED ORDER — DIGOXIN 0.25 MG/ML IJ SOLN: 0.1250 mg | Freq: Every day | INTRAMUSCULAR | Status: DC

## 2015-07-25 NOTE — Evaluation (Signed)
Occupational Therapy Evaluation Patient Details Name: Patrick [name]. MRN: [id number] DOB: 1945-03-17 Today's Date: 07/25/2015    History of Present Illness 70 y.o. male with past medical history of invasive adenocarcinoma of the stomach followed by Dr. Alen Blew, DMII, stroke, and afib. - He is unable to take coumadin or any anticoagulation due to GI bleeding from his gastric cancer. He was admitted on 07/17/2015 with SOB, hematemesis and hypotension. He was found to have bilateral pulmonary embolism, and likely partial gastric outlet obstruction.   Clinical Impression   Pt. Was cooperative with therapy to maximize I with ADLs and mobility. Pt. Has decreased strength and weakness and would benefit from further OT to maximize potential and achieve Mod I level prior to d/c home with family.     Follow Up Recommendations       Equipment Recommendations  3 in 1 bedside comode;Tub/shower seat    Recommendations for Other Services       Precautions / Restrictions Precautions Precautions: Fall (Incr HR) Restrictions Weight Bearing Restrictions: No      Mobility Bed Mobility                  Transfers       Sit to Stand: Mod assist Stand pivot transfers: Mod assist            Balance                                            ADL Overall ADL's : Needs assistance/impaired Eating/Feeding: Independent   Grooming: Wash/dry hands;Wash/dry face;Set up;Sitting   Upper Body Bathing: Set up;Sitting   Lower Body Bathing: Moderate assistance;Sit to/from stand   Upper Body Dressing : Minimal assistance;Sitting   Lower Body Dressing: Moderate assistance;Maximal assistance;Sit to/from stand   Toilet Transfer: Moderate assistance   Toileting- Clothing Manipulation and Hygiene: Minimal assistance       Functional mobility during ADLs: Moderate assistance General ADL Comments: Pt. needs assist      Vision     Perception     Praxis      Pertinent Vitals/Pain Pain Assessment: No/denies pain     Hand Dominance Right   Extremity/Trunk Assessment Upper Extremity Assessment Upper Extremity Assessment: Generalized weakness           Communication Communication Communication: No difficulties   Cognition Arousal/Alertness: Awake/alert Behavior During Therapy: WFL for tasks assessed/performed Overall Cognitive Status: Within Functional Limits for tasks assessed                     General Comments       Exercises       Shoulder Instructions      Home Living Family/patient expects to be discharged to:: Private residence Living Arrangements: Children Available Help at Discharge: Family;Available 24 hours/day Type of Home: Apartment Home Access: Level entry     Home Layout: Two level Alternate Level Stairs-Number of Steps: 15 Alternate Level Stairs-Rails: Right Bathroom Shower/Tub: Tub/shower unit Shower/tub characteristics: Architectural technologist: Standard     Home Equipment: Cane - single point          Prior Functioning/Environment Level of Independence: Independent with assistive device(s) (used cane)        Comments: independent with ADLs, cooking, laundry and was driving    OT Diagnosis: Generalized weakness   OT Problem List: Decreased strength;Decreased activity tolerance;Decreased knowledge  of use of DME or AE   OT Treatment/Interventions: Self-care/ADL training;DME and/or AE instruction;Therapeutic activities    OT Goals(Current goals can be found in the care plan section) Acute Rehab OT Goals Patient Stated Goal: to go home OT Goal Formulation: With patient Time For Goal Achievement: 08/08/15 Potential to Achieve Goals: Good ADL Goals Pt Will Perform Grooming: Independently;standing Pt Will Perform Upper Body Bathing: Independently;sitting Pt Will Perform Lower Body Bathing: with modified independence;sit to/from stand;with adaptive equipment Pt Will Perform Upper  Body Dressing: Independently;sitting Pt Will Perform Lower Body Dressing: with modified independence;with adaptive equipment;sit to/from stand Pt Will Transfer to Toilet: with modified independence;bedside commode Pt Will Perform Toileting - Clothing Manipulation and hygiene: with modified independence;sit to/from stand  OT Frequency: Min 2X/week   Barriers to D/C:            Co-evaluation              End of Session    Activity Tolerance: Patient tolerated treatment well Patient left: in bed;with call bell/phone within reach   Time: 0810-0850 OT Time Calculation (min): 40 min Charges:  OT General Charges $OT Visit: 1 Procedure OT Evaluation $OT Eval Moderate Complexity: 1 Procedure OT Treatments $Self Care/Home Management : 8-22 mins G-Codes:    Akhila Mahnken 08-18-2015, 8:51 AM

## 2015-07-25 NOTE — Consult Note (Signed)
[name] (covering for Dr. Benson [name])  History Patrick [name]. MRN # [id number]  Date of Admission: 07/17/2015 Date of Consultation: 07/25/2015 Referring physician: Dr. Allie Bossier, MD  Reason for Consultation/Chief Complaint: Vomiting and gastric cancer  Subjective HPI:  This is an unfortunate 70 year old man diagnosed with gastric adenocarcinoma I Dr. Benson [name]. The patient is now on palliative care with Xeloda, but his disease has progressed on that. He previously had upper GI bleeding on anticoagulation for A. fib, thus the [facility] was discontinued in November 2016. EGD by Dr. Benson [name] at that time showed a large ulcerated tumor burden in the gastric antrum. On this occasion, the patient was admitted with vomiting and progressive dyspnea, and is found to have extensive bilateral pulmonary emboli related to his malignancy. He has had large volume vomiting over the week that he has been admitted requiring placement of an NG tube. For some reason, he is also still on a full liquid diet. Sounds like there are discussions about palliative care, the patient seems to feel that he might be going to rehabilitation. It seems we were consulted with the question of whether this patient could have some kind of enteral access for either feeding or relief of his gastric outlet obstruction. The internal medicine team seems to be requesting possible PEG placement. Patient reports that he feels miserable from this continued vomiting he does not like the NG tube. On that, it is very difficult to get a helpful history or review of systems due to his debility and distress. ROS:  He is mildly dyspneic at rest.  All other systems are negative except as noted above in the HPI  Past Medical History Past Medical History  Diagnosis Date  . High blood pressure   . Dyslipidemia   . Persistent atrial fibrillation ([place]) 01/14/2009    afib--cardioversion successful x1 shock; recurrent A. fib  .  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 Type II diabetes mellitus ([place])   . Anemia   . History of blood transfusion 12/2014; 01/12/2015    "related to low HgB; related to low HgB" (01/12/2015)  . Upper GI bleed 12/2014; 01/12/2015  . Stroke [facility]) 2009    denies residual on 01/12/2015    Past Surgical History Past Surgical History  Procedure Laterality Date  . Cardiac catheterization  01/15/2009    LV 40%, no occlusive coronary disease,norenal artery stenosis or abdmoninal aotric aneurysm  . Doppler echocardiography  10 /2012    EF normal -probably elevated left atrial pressures unable to asses due to afib.;mildly scelrotic aortic valve but  no stenosis and mildlyelevated right atrial  pressures and pulm. pressures at 45-50 mmhg with mild to mod pulm  htn no change from 2011  . Cataract extraction w/ intraocular lens  implant, bilateral  2014  . Biopsy stomach  10/21/13    [id number]  . Inguinal hernia repair Bilateral   . Esophagogastroduodenoscopy N/A 01/14/2015    Procedure: ESOPHAGOGASTRODUODENOSCOPY (EGD);  Surgeon: Patrick Ada, MD;  Location: [facility] ENDOSCOPY;  Service: Endoscopy;  Laterality: N/A;    Family History History reviewed. No pertinent family history.  Social History Social History   Social History  . Marital Status: Widowed    Spouse Name: N/A  . Number of Children: N/A  . Years of Education: N/A   Social History Main Topics  . Smoking status: Former Smoker -- 1.00 packs/day for 45 years    Types: Cigarettes    Quit date: 11/21/2008  .  Smokeless tobacco: Never Used  . Alcohol Use: Yes     Comment: 01/12/2015 "quit in 2009"  . Drug Use: No  . Sexual Activity: Yes   Other Topics Concern  . None   Social History Narrative    He is a widower. He has 10 children, 2 grandchildren. Does not smoke, does not drink   alcohol. He walks about everywhere he goes and that is his main mode of transportation, and he uses a cane.   He quit smoking in  ~2011.     Allergies No Known Allergies  Outpatient Meds Home medications from the H+P and/or nursing med reconciliation reviewed.  Inpatient med list reviewed  _____________________________________________________________________ Objective  Exam:  Current vital signs  Patient Vitals for the past 8 hrs:  BP Temp Temp src Resp SpO2 Weight  07/25/15 0753 105/72 mmHg 98.1 F (36.7 C) Axillary (!) 21 100 % -  07/25/15 0455 120/75 mmHg 98.1 F (36.7 C) Axillary (!) 23 100 % -  07/25/15 0448 - - - - - 175 lb 4.3 oz (79.5 kg)    Intake/Output Summary (Last 24 hours) at 07/25/15 1042 Last data filed at 07/24/15 2234  Gross per 24 hour  Intake    320 ml  Output   2000 ml  Net  -1680 ml    Physical Exam:  He has poor muscle mass including temporal wasting  General: this is a [place] elderly male patient with NG tube in the right nostril, sitting up on the edge of the bed  Eyes: sclera anicteric, no redness  ENT: oral mucosa moist without lesions, no cervical or supraclavicular lymphadenopathy, edentulous  CV: RRR without murmur, S1/S2, no JVD,, 1+ pretibial  edema  Resp: clear to auscultation bilaterally, elevated RR and normal effort noted  GI: soft, no tenderness, with active bowel sounds. No guarding or palpable organomegaly noted  Skin; warm and dry, no rash or jaundice noted  Neuro: awake, alert and oriented x 3. Normal gross motor function and fluent speech.  Labs:   Recent Labs Lab 07/23/15 1807 07/24/15 0241 07/25/15 0517  WBC 11.8* 11.7* 10.9*  HGB 9.9* 9.7* 9.6*  HCT 32.1* 30.6* 31.2*  PLT 336 308 287    Recent Labs Lab 07/24/15 0919  NA 145  K 5.2*  CL 115*  CO2 18*  BUN 26*  GLUCOSE 119*    Recent Labs Lab 07/21/15 0231  INR 1.29    Radiologic studies: I have personally reviewed the CT angiogram of the chest, which also sees the upper abdomen. The patient clearly has a gastric outlet obstruction with a large amount of food and  fluid retained in the stomach and the esophagus. There is a large amount of bulky tumor in the distal half of the stomach with adjacent adenopathy. Bilateral pulmonary emboli are also seen, as reported above.  @ASSESSMENTPLANBEGIN @ Impression:  End-stage metastatic gastric adenocarcinoma Gastric outlet obstruction from the malignancy Vomiting related to gastric outlet obstruction Protein calorie malnutrition   Plan:  I believe that the unfortunate truth is that this poor man is nearing the end of his life. I do not believe it is technically feasible to advance any kind of nasogastric tube distal to this tumor into the small bowel to give him nutrition. Even if it were, it would not solve his protracted vomiting due to the high-grade bowel obstruction. There is likely no lumen left in the GI tract to place a stent, he most likely has no remaining gastric motility due  to the chronic obstruction, and he is also very high risk for sedation of endoscopic procedures related to his bilateral pulmonary emboli. His emboli cannot be anticoagulated due to risk of bleeding from the gastric cancer. I also do not think it is technically feasible to place a venting palliative gastrostomy due to the tumor burden in the stomach. Think this patient should not be on a full liquid diet and it is time for a family discussion related to palliative care, I think that will also require that he keep an NG tube in place to relieve his vomiting while he is otherwise kept comfortable. I will sign the patient not to Dr. Benson [name] when I speak with him later this evening.  Thank you for the courtesy of this consult.  Please contact me with any questions or concerns.  Nelida Meuse III Pager: [phone] Mon-Fri 8a-5p [phone] after 5p, weekends, holidays  CC: [facility]

## 2015-07-25 NOTE — Progress Notes (Signed)
PROGRESS NOTE    Patrick [name].  [id number] DOB: 10/31/1945 DOA: 07/17/2015 PCP: Thressa Sheller, MD   Brief Narrative:  70 y.o. WM PMHx Gastric Adenocarcinoma status post radiation therapy on palliative Xeloda, HTN, Diabetes type 2 on Metformin, iron Deficiency Anemia with baseline hemoglobin around 9 in the setting of ongoing chronic blood loss, Chronic Atrial Fibrillation not an anticoagulation secondary to the above issues, HTN. Patient presented to the ER with reports of shortness of breath.  Upon arrival to the ER patient was hypotensive with a systolic blood pressure in the 70s. ER physician was concerned in the setting of malignancy patient may have PE given presenting symptoms with associated dyspnea so a bedside quick look echocardiogram was completed that was concerning for RV strain. Labs also revealed acute kidney injury. EDP discussed with radiologist who recommended a low dye load CT angiogram of the chest to evaluate for PE. Initial lab data revealed an unexplained coagulopathy with an INR greater than 5 without overt signs of bleeding and normal platelet count. Because of this empiric heparin was placed on hold until CT of the chest could be completed.  Upon my evaluation of the patient he denied issues such as nausea vomiting diarrhea and inability to eat, any postprandial bloating or abdominal pain. He reports for the past 2 days he's been slightly more short of breath than usual not definitely correlated with activity. He developed sudden shortness of breath early this morning without chest pain and had not noticed any heart racing. He has not had any blood or dark stools.  ED Course:  Afebrile-initial blood pressure 75/46 -MAP 54-pulse 82-respirations 19-room air saturations at rest 100%; patient comfort ER staff placed patient on 2 L nasal cannula Repeat vital signs after fluid challenge: BP 94/64 with an MAP of 74-pulse 109-respirations 27 CT angiogram of the chest  with limited contrast medium: Portable chest x-ray: Unremarkable Lab data: Sodium 129, chloride 95, CO2 17, BUN 97, creatinine 2.47, glucose 213, anion gap 17, LFTs are normal except for slightly low ALT of 13, BNP 314, troponin 0.03, lactic acid 1.35, WBC 5700 with neutrophils 84% and absolute neutrophils 4.8%, hemoglobin 7.1, platelets 421,000; ABG with mild acidosis pH 7.32 with normal PCO2 of 28.4, PO2 normal at 86 bicarbonate 14.7 and acid base deficit [id number] c/w with metabolic acidemia, urinalysis cloudy without evidence of proteinuria or infection and slightly elevated specific gravity of 1.021, urine culture and blood cultures have been obtained in the ER; repeat coags and DIC panel is pending A total of or liters normal saline bolus has been infused while in the ER Heparin bolus was given 3000 units but infusion placed on hold given coagulopathy until diagnosis of PE confirmed   Assessment & Plan:   Principal Problem:   Bilateral pulmonary embolism (HCC) Active Problems:   Essential hypertension   Diabetes mellitus type II, uncontrolled (HCC)   Chronic atrial fibrillation (HCC)   Iron deficiency anemia secondary to blood loss (chronic)   TxN1 adenocarcinoma of the gastric antrum   Coagulopathy (HCC)   Acute kidney injury ([place])   Hypotension   Dehydration with hyponatremia   Pulmonary embolism (HCC)   Goals of care, counseling/discussion   Palliative care encounter   Partial gastric outlet obstruction   Advanced directives, counseling/discussion   Absolute anemia   Hiccups   Encounter for hospice care discussion   Venous embolism and thrombosis of superficial vessels of lower extremity   Controlled diabetes mellitus type 2 with complications ([place])  Gastric adenocarcinoma (HCC)   Paroxysmal atrial fibrillation (HCC)   Arterial hypotension   Pulmonary embolism with acute cor pulmonale (HCC)   Bilateral Pulmonary Embolism/Hypotension -Patient presented with dyspnea and  hypotension with quick look bedside ECHO concerning for RV strain; CT angiogram of the chest confirmed bilateral pulmonary emboli with moderate clot burden -In setting of initial unexplained coagulopathy I discussed with patient's oncologist Dr. Alen Blew who agrees appropriate to utilize IV heparin  -Bilateral Doppler; positive left SVT see results below -5/17 S/P placement of IVC filter  Left lower extremity SVT -Left positive SVT perforator vein mid calf. -See PE  Cardiomyopathy/ Pulmonary hypertension/Sinus tachycardia -Patient has sinus tachycardia with borderline BP, will try scheduled metoprolol. -Echocardiogram; cardiomyopathy with severe pulmonary hypertension see results below -Metoprolol IV 2.5 mg q 4hr  Essential Hypertension/Hypotension -BP continues to be borderline  -Continue to hold preadmission carvedilol, diltiazem ex RR, Lasix, lisinopril hydrochlorothiazide -See cardiomyopathy  Paroxysmal atrial fibrillation(CHADVASC = 5) -Initially presented with RVR and now sinus tachycardia -Negative anticoagulant n past to h/o GIB 2/2 gastric cancer - Patient still not rate controlled A. fib RVR--> sinus tachycardia HR 140+ -Continued relative hypotension; Digoxin IV 0.125 mg BID -5/20 slight improvement in A. fib but still not optimal -Digoxin level on 5/21; low--> Digoxin 0.25 mg BID x 2 doses; then restart 0.125 mg BID on 5/22  QT prolongation -In the setting of acute PE. Resolved   Coagulopathy  -INR normalized unfortunately did not help with patient's GI bleeding. -5/13 transfused 2 units RBC -Monitor for GI bleeding symptoms given history (see below) -Because of underlying gastric cancer with history of ongoing occult GIB was not  candidate for thrombolytic therapy  -Heparin DC'd and filter placed on 5/17--> GI bleed -Avoid all blood thinning medications, as patient has not tolerated   Acute kidney injury  -Patient's BUN and creatinine nearly double baseline of  20/1.1 in March 2017 -Patient was developing azotemia and April 2017 with BUN up to 54 and creatinine 1.6 noting he was on thiazide diuretics and Lasix prior to admission; held secondary to hypotension Lab Results  Component Value Date   CREATININE 1.08 07/24/2015   CREATININE 0.97 07/23/2015   CREATININE 1.03 07/21/2015  -Resolved  Iron deficiency anemia secondary to blood loss (chronic)(Baseline hemoglobin~9.5 -11)  -Of note also has a history of chronic iron deficiency and per oncology notes has responded to IV iron in the past -check anemia panel  Recent Labs Lab 07/21/15 0231 07/22/15 0309 07/23/15 1807 07/24/15 0241 07/25/15 0517  HGB 9.5* 9.0* 9.9* 9.7* 9.6*  -Hemoglobin slowly improving   TxN1 adenocarcinoma of the gastric antrum -Last PET scan January 2017 mild progression without widespread disease and stable on Xeloda for palliative purposes -Last evaluated by oncology 06/17/15 -DC Xeloda; see goals of care  -Palliative care consult pending; Residential Hospice.  Gastric outlet obstruction -See CTA -5/20 Patient continues to have emesis with a liquid diet secondary to obstruction. Spoke with IR did not believe he could safely advanced NG tube.   -5/21 called Consult Dr.Patrick Benson [facility]; see goals of care  Diabetes mellitus type II, Controlled with complication -Holding metformin the setting of acute kidney injury and presumed ATN -5/13 Hemoglobin A1c= 6.6  -Sensitive SSI    Goals of care -Per patient son and daughter-in-law were given list of SNF and are currently evaluating. Speak with NCM Blain Pais and NP Haynes Dage (palliative care) on 5/20  -Spoke with patient at length concerning Dr.Henry L Danis III GI concerning his assessment  on being able to advance NG tube vs placement of a PEG tube or venting palliative gastrostomy tube pass gastric tumor mass. Per his note there was absolutely no way to place any tubes pass the tumor burden safely. Dr. Simona Huh did state  he would also talk over the problem with Dr. Benson [place] on Monday. After answering patient's questions concerning the assessment patient decided; 1. He would like to be made DO NOT RESUSCITATE. 2. Would like to discuss with palliative care residential hospice options; urgent request placed.    DVT prophylaxis: SCD Code Status: Partial Family Communication: Pastor and church member present Disposition Plan: Stabilization of PE and its sequela; home hospice?   Consultants:  Dr. Mike Gip de [facility]    Procedures/Significant Events:  5/13 transfused 2 units RBC 5/13 CTA chest;-Bilateral PE with a moderate burden.-Enlarging node in the epicardial fat anterior to the Liver.-Findings known gastric cancer seen in the distal gastric body and antrum with worsening nodes upper abdomen. -Gastric distention suggesting partial gastric outlet obstruction. 5/13 echocardiogram;Left ventricle: moderate LVH.-LVEF= 60%-65%. - Ventricular septum: The contour showed diastolic flattening and systolic flattening. - Left atrium: moderately dilated.-- Right ventricle: moderately dilated.  - Right atrium: moderately dilated.--Tricuspid valve: moderate-severe regurgitation. - Pulmonary arteries:  PA peak pressure: 77 mm Hg (S). 5/13 bilateral lower extremity Doppler; negative right DVT/SVT. Left positive SVT perforator vein mid calf.  5/17 IVC filter placed, infrarenal, retrievable   Cultures BCx2 5/13>>> (-) Urine 5/13>>> No Growth  Antimicrobials: vanc 5/13>>> 5/15 Zosyn 5/13>>> 5/15   Devices    LINES / TUBES:  PIV x 2    Continuous Infusions: . sodium chloride 50 mL/hr at 07/25/15 1024     Subjective: 5/21 A/O 4, sitting in chair mild discomfort. Doristine Bosworth and church member present  .   Objective: Filed Vitals:   07/25/15 0448 07/25/15 0455 07/25/15 0753 07/25/15 1233  BP:  120/75 105/72 110/71  Pulse:      Temp:  98.1 F (36.7 C) 98.1 F  (36.7 C) 97.4 F (36.3 C)  TempSrc:  Axillary Axillary Axillary  Resp:  23 21 20   Height:      Weight: 79.5 kg (175 lb 4.3 oz)     SpO2:  100% 100% 100%    Intake/Output Summary (Last 24 hours) at 07/25/15 1628 Last data filed at 07/25/15 1024  Gross per 24 hour  Intake      0 ml  Output   2500 ml  Net  -2500 ml   Filed Weights   07/22/15 0500 07/24/15 0400 07/25/15 0448  Weight: 78.7 kg (173 lb 8 oz) 80.2 kg (176 lb 12.9 oz) 79.5 kg (175 lb 4.3 oz)    Examination:  General: A/O 4, sitting in chair NAD. , No acute respiratory distress Eyes: negative scleral hemorrhage, negative anisocoria, Positive mild icterus ENT: Negative Runny nose, negative gingival bleeding, NG tube present right nare Neck:  Negative scars, masses, torticollis, lymphadenopathy, JVD Lungs: Clear to auscultation bilaterally without wheezes or crackles Cardiovascular: Sinus tachycardia, without murmur gallop or rub normal S1 and S2 Abdomen: negative abdominal pain, positive distended, hypoactive bowel sounds, no rebound, no ascites, no appreciable mass Extremities: No significant cyanosis, clubbing, or edema bilateral lower extremities Skin: Negative rashes, lesions, ulcers Psychiatric:  Negative depression, negative anxiety, negative fatigue, negative mania  Central nervous system:  Cranial nerves II through XII intact, tongue/uvula midline, all extremities muscle strength 5/5, sensation intact throughout,negative dysarthria, negative expressive  aphasia, negative receptive aphasia.  .     Data Reviewed: Care during the described time interval was provided by me .  I have reviewed this patient's available data, including medical history, events of note, physical examination, and all test results as part of my evaluation. I have personally reviewed and interpreted all radiology studies.  CBC:  Recent Labs Lab 07/21/15 0231 07/22/15 0309 07/23/15 1807 07/24/15 0241 07/25/15 0517  WBC 8.0 10.0  11.8* 11.7* 10.9*  NEUTROABS  --   --  10.3* 10.2* 9.9*  HGB 9.5* 9.0* 9.9* 9.7* 9.6*  HCT 29.6* 28.3* 32.1* 30.6* 31.2*  MCV 91.9 91.6 92.2 91.3 94.5  PLT 327 356 336 308 [id number]   Basic Metabolic Panel:  Recent Labs Lab 07/19/15 0400 07/21/15 0231 07/23/15 1807 07/24/15 0241 07/24/15 0919 07/25/15 0517  NA 137 142 142  --  145  --   K 4.1 4.6 5.3*  --  5.2*  --   CL 113* 116* 113*  --  115*  --   CO2 15* 17* 16*  --  18*  --   GLUCOSE 156* 128* 154*  --  119*  --   BUN 27* 31* 32*  --  26*  --   CREATININE 1.11 1.03 0.97  --  1.08  --   CALCIUM 7.9* 8.4* 8.6*  --  8.5*  --   MG  --   --  2.3 2.1  --  2.2   GFR: Estimated Creatinine Clearance: 68.9 mL/min (by C-G formula based on Cr of 1.08). Liver Function Tests: No results for input(s): AST, ALT, ALKPHOS, BILITOT, PROT, ALBUMIN in the last 168 hours. No results for input(s): LIPASE, AMYLASE in the last 168 hours. No results for input(s): AMMONIA in the last 168 hours. Coagulation Profile:  Recent Labs Lab 07/21/15 0231  INR 1.29   Cardiac Enzymes: No results for input(s): CKTOTAL, CKMB, CKMBINDEX, TROPONINI in the last 168 hours. BNP (last 3 results) No results for input(s): PROBNP in the last 8760 hours. HbA1C: No results for input(s): HGBA1C in the last 72 hours. CBG:  Recent Labs Lab 07/24/15 2155 07/25/15 0045 07/25/15 0445 07/25/15 0802 07/25/15 1151  GLUCAP 114* 119* 114* 122* 116*   Lipid Profile: No results for input(s): CHOL, HDL, LDLCALC, TRIG, CHOLHDL, LDLDIRECT in the last 72 hours. Thyroid Function Tests: No results for input(s): TSH, T4TOTAL, FREET4, T3FREE, THYROIDAB in the last 72 hours. Anemia Panel: No results for input(s): VITAMINB12, FOLATE, FERRITIN, TIBC, IRON, RETICCTPCT in the last 72 hours. Urine analysis:    Component Value Date/Time   COLORURINE YELLOW 07/17/2015 0655   APPEARANCEUR CLOUDY* 07/17/2015 0655   LABSPEC 1.021 07/17/2015 0655   PHURINE 5.0 07/17/2015 0655    GLUCOSEU NEGATIVE 07/17/2015 0655   HGBUR NEGATIVE 07/17/2015 0655   BILIRUBINUR NEGATIVE 07/17/2015 0655   KETONESUR NEGATIVE 07/17/2015 0655   PROTEINUR NEGATIVE 07/17/2015 0655   UROBILINOGEN 1.0 12/20/2014 1110   NITRITE NEGATIVE 07/17/2015 0655   LEUKOCYTESUR NEGATIVE 07/17/2015 0655   Sepsis Labs: @LABRCNTIP (procalcitonin:4,lacticidven:4)  ) Recent Results (from the past 240 hour(s))  Blood Culture (routine x 2)     Status: None   Collection Time: 07/17/15  5:08 AM  Result Value Ref Range Status   Specimen Description BLOOD RIGHT ANTECUBITAL  Final   Special Requests BOTTLES DRAWN AEROBIC AND ANAEROBIC 5ML  Final   Culture NO GROWTH 5 DAYS  Final   Report Status 07/22/2015 FINAL  Final  Blood Culture (routine x 2)  Status: None   Collection Time: 07/17/15  5:30 AM  Result Value Ref Range Status   Specimen Description BLOOD LEFT ANTECUBITAL  Final   Special Requests BOTTLES DRAWN AEROBIC AND ANAEROBIC 4ML  Final   Culture NO GROWTH 5 DAYS  Final   Report Status 07/22/2015 FINAL  Final  Urine culture     Status: None   Collection Time: 07/17/15  6:55 AM  Result Value Ref Range Status   Specimen Description URINE, CATHETERIZED  Final   Special Requests NONE  Final   Culture NO GROWTH  Final   Report Status 07/18/2015 FINAL  Final  MRSA PCR Screening     Status: None   Collection Time: 07/17/15  4:00 PM  Result Value Ref Range Status   MRSA by PCR NEGATIVE NEGATIVE Final    Comment:        The GeneXpert MRSA Assay (FDA approved for NASAL specimens only), is one component of a comprehensive MRSA colonization surveillance program. It is not intended to diagnose MRSA infection nor to guide or monitor treatment for MRSA infections.          Radiology Studies: No results found.      Scheduled Meds: . antiseptic oral rinse  7 mL Mouth Rinse q12n4p  . chlorhexidine  15 mL Mouth Rinse BID  . digoxin  0.125 mg Intravenous Daily  . digoxin  0.25 mg  Intravenous BID  . folic acid  1 mg Intravenous Daily  . insulin aspart  0-9 Units Subcutaneous Q4H  . metoprolol  2.5 mg Intravenous Q4H  . sodium chloride flush  3 mL Intravenous Q12H  . thiamine IV  100 mg Intravenous Daily   Continuous Infusions: . sodium chloride 50 mL/hr at 07/25/15 1024     LOS: 8 days    Time spent: 40 minutes    WOODS, Geraldo Docker, MD [facility] Pager [phone]   If 7PM-7AM, please contact night-coverage [email or web address] Password [facility] 07/25/2015, 4:28 PM

## 2015-07-26 LAB — GLUCOSE, CAPILLARY
GLUCOSE-CAPILLARY: 109 mg/dL — AB (ref 65–99)
GLUCOSE-CAPILLARY: 86 mg/dL (ref 65–99)
Glucose-Capillary: 101 mg/dL — ABNORMAL HIGH (ref 65–99)

## 2015-07-26 LAB — MAGNESIUM: Magnesium: 2 mg/dL (ref 1.7–2.4)

## 2015-07-26 LAB — CBC WITH DIFFERENTIAL/PLATELET
BASOS ABS: 0 10*3/uL (ref 0.0–0.1)
BASOS PCT: 0 %
EOS ABS: 0 10*3/uL (ref 0.0–0.7)
Eosinophils Relative: 0 %
HCT: 29.9 % — ABNORMAL LOW (ref 39.0–52.0)
HEMOGLOBIN: 9.2 g/dL — AB (ref 13.0–17.0)
LYMPHS PCT: 4 %
Lymphs Abs: 0.4 10*3/uL — ABNORMAL LOW (ref 0.7–4.0)
MCH: 28.3 pg (ref 26.0–34.0)
MCHC: 30.8 g/dL (ref 30.0–36.0)
MCV: 92 fL (ref 78.0–100.0)
MONO ABS: 0.7 10*3/uL (ref 0.1–1.0)
Monocytes Relative: 7 %
NEUTROS PCT: 89 %
Neutro Abs: 8.7 10*3/uL — ABNORMAL HIGH (ref 1.7–7.7)
PLATELETS: 241 10*3/uL (ref 150–400)
RBC: 3.25 MIL/uL — AB (ref 4.22–5.81)
RDW: 21.5 % — ABNORMAL HIGH (ref 11.5–15.5)
WBC: 9.8 10*3/uL (ref 4.0–10.5)

## 2015-07-26 MED ORDER — BIOTENE DRY MOUTH MT LIQD: 15.0000 mL | OROMUCOSAL | Status: AC | PRN

## 2015-07-26 MED ORDER — CHLORPROMAZINE HCL 25 MG/ML IJ SOLN
50.0000 mg | Freq: Four times a day (QID) | INTRAMUSCULAR | Status: DC | PRN
  Administered 2015-07-26: 50 mg via INTRAVENOUS
  Filled 2015-07-26 (×3): qty 2

## 2015-07-26 MED ORDER — SODIUM CHLORIDE 0.9 % IV SOLN: 50.0000 mg | Freq: Four times a day (QID) | INTRAVENOUS | Status: AC | PRN

## 2015-07-26 MED ORDER — ACETAMINOPHEN 650 MG RE SUPP: 650.0000 mg | Freq: Four times a day (QID) | RECTAL | Status: AC | PRN

## 2015-07-26 MED ORDER — PROCHLORPERAZINE EDISYLATE 5 MG/ML IJ SOLN: 5.0000 mg | Freq: Four times a day (QID) | INTRAMUSCULAR | Status: AC | PRN

## 2015-07-26 NOTE — Clinical Social Work Note (Signed)
Clinical Social Worker met with patient and patient son at bedside to offer continued support and communicate about potential transfer to [facility].  Patient, patient son, and patient daughter (over the phone) in agreement with transfer.  Erling Conte, [place] liaison completed paperwork with family at bedside and provided number for report.  Discharge summary faxed to facility and patient son aware of discharge plans.  Clinical Social Worker facilitated patient discharge including contacting patient family and facility to confirm patient discharge plans.  Clinical information faxed to facility and family agreeable with plan.  CSW arranged ambulance transport via [facility] to [facility].  RN to call report prior to discharge.  Clinical Social Worker will sign off for now as social work intervention is no longer needed. Please consult [place] again if new need arises.  Barbette Or, [place]

## 2015-07-26 NOTE — Consult Note (Signed)
   [facility] CM Inpatient Consult   07/26/2015  Patrick [name]. 08/11/1945 [id number]  Patient screened for potential [facility] Management services. Patient is eligible for [facility] Care Management services under patient's Medicare  plan. Spoke with inpatient and reviewed notes and the patient is currently for Hospice care.  [facility] Care Management will not be appropriate for current disposition.   Please place a [facility] Care Management consult or for questions contact:   Natividad Brood, RN BSN [facility] Liaison  [phone] business mobile phone Toll free office [phone]

## 2015-07-26 NOTE — Discharge Summary (Signed)
DISCHARGE SUMMARY  Patrick [name].  MR#: [id number]  DOB:26-Aug-1945  Date of Admission: 07/17/2015 Date of Discharge: 07/26/2015  Attending Physician:MCCLUNG,JEFFREY T  Patient's KDX:IPJASNKNL,ZJQBH, MD  Consults: PCCM [place] [facility]  Disposition: D/C to [place]   Discharge Diagnoses: Bilateral Pulmonary Emboli Left lower extremity SVT Cardiomyopathy / Pulmonary hypertension  Hypotension w/ hx of HTN  Paroxysmal atrial fibrillation QT prolongation Coagulopathy  Acute kidney injury  Iron deficiency anemia secondary to acute on chronic blood loss TxN1 adenocarcinoma of the gastric antrum Gastric outlet obstruction DM2  Initial presentation: 70 y.o. M Hx Gastric Adenocarcinoma status post radiation therapy on palliative Xeloda, HTN, DM2, iron deficiency Anemia with baseline hemoglobin around 9 in the setting of ongoing chronic blood loss, Chronic Atrial Fibrillation not an anticoagulation secondary to the above issues, and HTN who presented to the ER with reports of shortness of breath.  In the ED the patient had a systolic blood pressure in the 70s. ER physician performed a quick look echocardiogram that was concerning for RV strain. Labs revealed acute kidney injury and an unexplained coagulopathy with an INR greater than 5.   Hospital Course:  After a complete medical evaluation, and careful consideration of all of his options, the pt decided to pursue a comfort focused care plan.  PC and Hospice met w/ the pt and his family in the hospital.  A bed became available at [facility] on 5/22, and the pt decided he would be interested in transferring to this facility for ongoing care.  At the time of his d/c an NG remains in place for comfort.  He is not requiring pain medications, but is most troubled by refractory hiccups, for which he is being given thorazine IV.  He is to remain on a liquid diet as desired, as a more solid diet will rapidly lead to  vomiting in the setting of his near complete GOO.   The following represent the active medical issue at the time of his D/C to [place]:  Bilateral Pulmonary Emboli -CT angio chest confirmed bilateral pulmonary emboli with moderate clot burden -Bilateral LE venous Doppler - positive left SVT but no DVT  -5/17 S/P placement of IVC filter w/ discontinuation of anticoag due to ongoing GIB  -decision has now been made to pursue comfort focused care   Left lower extremity SVT -Left positive SVT perforator vein mid calf  Cardiomyopathy / Pulmonary hypertension   Hypotension w/ hx of HTN   Paroxysmal atrial fibrillation -CHADVASC is 5 -no anticoagulant in past w/ h/o GIB 2/2 gastric cancer  QT prolongation -In the setting of acute PE - resolved   Coagulopathy  -INR normalized   Acute kidney injury  -baseline crt 1.05 May 2015  Iron deficiency anemia secondary to acute on chronic blood loss -baseline hemoglobin ~ 9.5 - 11 -history of chronic iron deficiency   TxN1 adenocarcinoma of the gastric antrum -PET scan January 2017 mild progression without widespread disease and stable on Xeloda for palliative purposes -Last evaluated by Oncology 06/17/15  Gastric outlet obstruction -patient continued to have emesis with a liquid diet secondary to obstruction - IR did not believe they could safely advance NG tube   DM2    Medication List    STOP taking these medications        aspirin 81 MG EC tablet     capecitabine 500 MG tablet  Commonly known as:  XELODA     carvedilol 3.125 MG tablet  Commonly known as:  COREG     DILT-XR 180 MG 24 hr capsule  Generic drug:  diltiazem     ferrous sulfate 325 (65 FE) MG tablet     furosemide 20 MG tablet  Commonly known as:  LASIX     glipiZIDE 5 MG 24 hr tablet  Commonly known as:  GLUCOTROL XL     LIPITOR 10 MG tablet  Generic drug:  atorvastatin     lisinopril-hydrochlorothiazide 20-12.5 MG tablet  Commonly known  as:  PRINZIDE,ZESTORETIC     loperamide 2 MG tablet  Commonly known as:  IMODIUM A-D     metFORMIN 1000 MG tablet  Commonly known as:  GLUCOPHAGE     pantoprazole 40 MG tablet  Commonly known as:  PROTONIX      TAKE these medications        acetaminophen 650 MG suppository  Commonly known as:  TYLENOL  Place 1 suppository (650 mg total) rectally every 6 (six) hours as needed for mild pain (or Fever >/= 101).     antiseptic oral rinse Liqd  15 mLs by Mouth Rinse route as needed for dry mouth.     chlorproMAZINE 50 mg in sodium chloride 0.9 % 25 mL  Inject 50 mg into the vein every 6 (six) hours as needed.     prochlorperazine 5 MG/ML injection  Commonly known as:  COMPAZINE  Inject 1 mL (5 mg total) into the vein every 6 (six) hours as needed.        Day of Discharge BP 107/53 mmHg  Pulse 91  Temp(Src) 97.5 F (36.4 C) (Axillary)  Resp 33  Ht 6' (1.829 m)  Wt 79.289 kg (174 lb 12.8 oz)  BMI 23.70 kg/m2  SpO2 90%  Physical Exam: General: No acute respiratory distress Lungs: Clear to auscultation bilaterally without wheezes or crackles Cardiovascular: tachycardic w/o appreciable M or rub  Abdomen: Nontender, nondistended, soft, no rebound Extremities: No significant cyanosis or clubbing; 1+ edema bilateral lower extremities  Basic Metabolic Panel:  Recent Labs Lab 07/21/15 0231 07/23/15 1807 07/24/15 0241 07/24/15 0919 07/25/15 0517 07/26/15 0419  NA 142 142  --  145  --   --   K 4.6 5.3*  --  5.2*  --   --   CL 116* 113*  --  115*  --   --   CO2 17* 16*  --  18*  --   --   GLUCOSE 128* 154*  --  119*  --   --   BUN 31* 32*  --  26*  --   --   CREATININE 1.03 0.97  --  1.08  --   --   CALCIUM 8.4* 8.6*  --  8.5*  --   --   MG  --  2.3 2.1  --  2.2 2.0   Coags:  Recent Labs Lab 07/21/15 0231  INR 1.29   CBC:  Recent Labs Lab 07/22/15 0309 07/23/15 1807 07/24/15 0241 07/25/15 0517 07/26/15 0419  WBC 10.0 11.8* 11.7* 10.9* 9.8    NEUTROABS  --  10.3* 10.2* 9.9* 8.7*  HGB 9.0* 9.9* 9.7* 9.6* 9.2*  HCT 28.3* 32.1* 30.6* 31.2* 29.9*  MCV 91.6 92.2 91.3 94.5 92.0  PLT 356 336 308 287 241   CBG:  Recent Labs Lab 07/25/15 2022 07/25/15 2317 07/26/15 0456 07/26/15 0741 07/26/15 1140  GLUCAP 113* 121* 101* 109* 86    Recent Results (from the past 240 hour(s))  Blood Culture (routine x 2)  Status: None   Collection Time: 07/17/15  5:08 AM  Result Value Ref Range Status   Specimen Description BLOOD RIGHT ANTECUBITAL  Final   Special Requests BOTTLES DRAWN AEROBIC AND ANAEROBIC 5ML  Final   Culture NO GROWTH 5 DAYS  Final   Report Status 07/22/2015 FINAL  Final  Blood Culture (routine x 2)     Status: None   Collection Time: 07/17/15  5:30 AM  Result Value Ref Range Status   Specimen Description BLOOD LEFT ANTECUBITAL  Final   Special Requests BOTTLES DRAWN AEROBIC AND ANAEROBIC 4ML  Final   Culture NO GROWTH 5 DAYS  Final   Report Status 07/22/2015 FINAL  Final  Urine culture     Status: None   Collection Time: 07/17/15  6:55 AM  Result Value Ref Range Status   Specimen Description URINE, CATHETERIZED  Final   Special Requests NONE  Final   Culture NO GROWTH  Final   Report Status 07/18/2015 FINAL  Final  MRSA PCR Screening     Status: None   Collection Time: 07/17/15  4:00 PM  Result Value Ref Range Status   MRSA by PCR NEGATIVE NEGATIVE Final    Comment:        The GeneXpert MRSA Assay (FDA approved for NASAL specimens only), is one component of a comprehensive MRSA colonization surveillance program. It is not intended to diagnose MRSA infection nor to guide or monitor treatment for MRSA infections.      Time spent in discharge (includes decision making & examination of pt): >30 minutes  07/26/2015, 3:22 PM   Cherene Altes, MD [facility] Office  [phone] Pager [phone]  On-Call/Text Page:      [email or web address]      password [facility]

## 2015-07-26 NOTE — Care Management Important Message (Signed)
Important Message  Patient Details  Name: Patrick [name]. MRN: [id number] Date of Birth: [birth year]   Medicare Important Message Given:  Yes    Nathen May 07/26/2015, 1:46 PM

## 2015-07-26 NOTE — Progress Notes (Signed)
[place] TEAM 1 - Stepdown/ICU TEAM  Patrick [name].  [id number] DOB: 03/05/1946 DOA: 07/17/2015 PCP: Thressa Sheller, MD    Brief Narrative:  70 y.o. M Hx Gastric Adenocarcinoma status post radiation therapy on palliative Xeloda, HTN, DM2, iron deficiency Anemia with baseline hemoglobin around 9 in the setting of ongoing chronic blood loss, Chronic Atrial Fibrillation not an anticoagulation secondary to the above issues, and HTN who presented to the ER with reports of shortness of breath.  In the ED the patient had a systolic blood pressure in the 70s. ER physician performed a quick look echocardiogram that was concerning for RV strain. Labs revealed acute kidney injury and an unexplained coagulopathy with an INR greater than 5.   Assessment & Plan:  Bilateral Pulmonary Emboli -CT angio chest confirmed bilateral pulmonary emboli with moderate clot burden -Bilateral LE venous Doppler - positive left SVT but no DVT  -5/17 S/P placement of IVC filter w/ discontinuation of anticoag due to ongoing GIB  -decision has now been made to pursue comfort focused care   Left lower extremity SVT -Left positive SVT perforator vein mid calf  Cardiomyopathy / Pulmonary hypertension   Hypotension w/ hx of HTN   Paroxysmal atrial fibrillation -CHADVASC is 5 -no anticoagulant in past w/ h/o GIB 2/2 gastric cancer  QT prolongation -In the setting of acute PE - resolved   Coagulopathy  -INR normalized   Acute kidney injury  -baseline crt 1.05 May 2015  Iron deficiency anemia secondary to acute on chronic blood loss -baseline hemoglobin ~ 9.5 - 11 -history of chronic iron deficiency   TxN1 adenocarcinoma of the gastric antrum -PET scan January 2017 mild progression without widespread disease and stable on Xeloda for palliative purposes -Last evaluated by Oncology 06/17/15  Gastric outlet obstruction -patient continued to have emesis with a liquid diet secondary to obstruction - IR  did not believe they could safely advance NG tube   DM2  Goals of care Dr. Sherral Hammers spoke with patient at length concerning the inability to advance an NG tube or to place a PEG tube or venting palliative gastrostomy tube past the gastric tumor mass - the patient decided he would like to be made DO NOT RESUSCITATE, and desired to discuss with palliative care residential hospice options  DVT prophylaxis: SCDs Code Status: NO CODE Family Communication: no family present at time of exam  Disposition Plan: potential [facility] transfer - awaiting PC re-eval   Consultants:  PCCM [facility]  Procedures: 5/13 transfused 2 units RBC 5/13 TTE EF 60%-65%. - Ventricular septum: The contour showed diastolic flattening and systolic flattening - Right ventricle: moderately dilated - PA peak pressure: 77 mm Hg (S). 5/13 bilateral lower extremity Doppler; negative right DVT/SVT. Left positive SVT perforator vein mid calf.  5/17 IVC filter placed, infrarenal, retrievable   Antimicrobials:  Vanc 5/13 > 5/15 Zosyn 5/13 > 5/15  Subjective: Denies vomiting or signif nausea at present.  C/o ongoing intermittent hiccups.  Denies cp, abdom pain, sob, or HA.    Objective: Blood pressure 120/83, pulse 44, temperature 97.5 F (36.4 C), temperature source Axillary, resp. rate 25, height 6' (1.829 m), weight 79.289 kg (174 lb 12.8 oz), SpO2 89 %.  Intake/Output Summary (Last 24 hours) at 07/26/15 1058 Last data filed at 07/26/15 0900  Gross per 24 hour  Intake   1320 ml  Output   2000 ml  Net   -680 ml   Filed Weights   07/24/15 0400  07/25/15 0448 07/26/15 0500  Weight: 80.2 kg (176 lb 12.9 oz) 79.5 kg (175 lb 4.3 oz) 79.289 kg (174 lb 12.8 oz)    Examination: General: No acute respiratory distress Lungs: Clear to auscultation bilaterally without wheezes or crackles Cardiovascular: tachycardic w/o appreciable M or rub  Abdomen: Nontender, nondistended, soft, no  rebound Extremities: No significant cyanosis or clubbing; 1+ edema bilateral lower extremities  CBC:  Recent Labs Lab 07/22/15 0309 07/23/15 1807 07/24/15 0241 07/25/15 0517 07/26/15 0419  WBC 10.0 11.8* 11.7* 10.9* 9.8  NEUTROABS  --  10.3* 10.2* 9.9* 8.7*  HGB 9.0* 9.9* 9.7* 9.6* 9.2*  HCT 28.3* 32.1* 30.6* 31.2* 29.9*  MCV 91.6 92.2 91.3 94.5 92.0  PLT 356 336 308 287 [id number]   Basic Metabolic Panel:  Recent Labs Lab 07/21/15 0231 07/23/15 1807 07/24/15 0241 07/24/15 0919 07/25/15 0517 07/26/15 0419  NA 142 142  --  145  --   --   K 4.6 5.3*  --  5.2*  --   --   CL 116* 113*  --  115*  --   --   CO2 17* 16*  --  18*  --   --   GLUCOSE 128* 154*  --  119*  --   --   BUN 31* 32*  --  26*  --   --   CREATININE 1.03 0.97  --  1.08  --   --   CALCIUM 8.4* 8.6*  --  8.5*  --   --   MG  --  2.3 2.1  --  2.2 2.0   GFR: Estimated Creatinine Clearance: 68.9 mL/min (by C-G formula based on Cr of 1.08).  Coagulation Profile:  Recent Labs Lab 07/21/15 0231  INR 1.29   CBG:  Recent Labs Lab 07/25/15 1627 07/25/15 2022 07/25/15 2317 07/26/15 0456 07/26/15 0741  GLUCAP 121* 113* 121* 101* 109*    Recent Results (from the past 240 hour(s))  Blood Culture (routine x 2)     Status: None   Collection Time: 07/17/15  5:08 AM  Result Value Ref Range Status   Specimen Description BLOOD RIGHT ANTECUBITAL  Final   Special Requests BOTTLES DRAWN AEROBIC AND ANAEROBIC 5ML  Final   Culture NO GROWTH 5 DAYS  Final   Report Status 07/22/2015 FINAL  Final  Blood Culture (routine x 2)     Status: None   Collection Time: 07/17/15  5:30 AM  Result Value Ref Range Status   Specimen Description BLOOD LEFT ANTECUBITAL  Final   Special Requests BOTTLES DRAWN AEROBIC AND ANAEROBIC 4ML  Final   Culture NO GROWTH 5 DAYS  Final   Report Status 07/22/2015 FINAL  Final  Urine culture     Status: None   Collection Time: 07/17/15  6:55 AM  Result Value Ref Range Status   Specimen  Description URINE, CATHETERIZED  Final   Special Requests NONE  Final   Culture NO GROWTH  Final   Report Status 07/18/2015 FINAL  Final  MRSA PCR Screening     Status: None   Collection Time: 07/17/15  4:00 PM  Result Value Ref Range Status   MRSA by PCR NEGATIVE NEGATIVE Final    Comment:        The GeneXpert MRSA Assay (FDA approved for NASAL specimens only), is one component of a comprehensive MRSA colonization surveillance program. It is not intended to diagnose MRSA infection nor to guide or monitor treatment for MRSA infections.  Scheduled Meds: . antiseptic oral rinse  7 mL Mouth Rinse q12n4p  . chlorhexidine  15 mL Mouth Rinse BID  . [START ON 07/27/2015] digoxin  0.125 mg Intravenous Daily  . folic acid  1 mg Intravenous Daily  . insulin aspart  0-9 Units Subcutaneous Q4H  . metoprolol  2.5 mg Intravenous Q4H  . sodium chloride flush  3 mL Intravenous Q12H  . thiamine IV  100 mg Intravenous Daily   Continuous Infusions: . sodium chloride 50 mL/hr at 07/25/15 1024     LOS: 9 days   Time spent: 25 minutes   Cherene Altes, MD [facility] Office  [phone] Pager - Text Page per Shea Evans as per below:  On-Call/Text Page:      [email or web address]      password [email or web address]  If 7PM-7AM, please contact night-coverage [email or web address] Password [facility] 07/26/2015, 10:58 AM

## 2015-07-26 NOTE — Progress Notes (Signed)
Physical Therapy Treatment Patient Details Name: Patrick [name]. MRN: [id number] DOB: 05-Dec-1945 Today's Date: 07/26/2015    History of Present Illness 70 y.o. male with past medical history of invasive adenocarcinoma of the stomach followed by Dr. Alen Blew, DMII, stroke, and afib. - He is unable to take coumadin or any anticoagulation due to GI bleeding from his gastric cancer. He was admitted on 07/17/2015 with SOB, hematemesis and hypotension. He was found to have bilateral pulmonary embolism, and likely partial gastric outlet obstruction.    PT Comments    Pt sitting up in recliner upon arrival. Pt confirmed he desires to continuing working with PT to focus on improving strength and mobility. Per MD note pt is end stage and palliative care may be initiated. Pt was agreeable to chair exercises today secondary to already assisted up with the nursing staff. Pt was able to tolerate bilateral LE exercises in the chair. Pt did require multiple rests breaks for elevated HR and decreased O2 with activity. Pt will continue to benefit from skilled PT with a focus on improved mobility and Independence. Will continue to monitor pt and family desires to continue therapy.  Follow Up Recommendations  SNF     Equipment Recommendations  None recommended by PT    Recommendations for Other Services       Precautions / Restrictions Precautions Precautions: Fall Precaution Comments: monitor vitals with activity Restrictions Weight Bearing Restrictions: No    Mobility  Bed Mobility               General bed mobility comments: NT in chair on arrival  Transfers Overall transfer level:  (NT pt agreeable to chair ecercises)                  Ambulation/Gait                 Stairs            Wheelchair Mobility    Modified Rankin (Stroke Patients Only)       Balance                                    Cognition Arousal/Alertness:  Awake/alert Behavior During Therapy: WFL for tasks assessed/performed Overall Cognitive Status: Within Functional Limits for tasks assessed                      Exercises General Exercises - Lower Extremity Ankle Circles/Pumps: AROM;Both;15 reps;Seated;Strengthening Quad Sets: AROM;Strengthening;Both;15 reps;Seated Short Arc Quad: AROM;Strengthening;Both;15 reps;Seated Heel Slides: AAROM;Strengthening;Both;15 reps;Seated Straight Leg Raises: AAROM;Strengthening;Both;15 reps;Seated    General Comments        Pertinent Vitals/Pain Pain Assessment: No/denies pain    Home Living                      Prior Function            PT Goals (current goals can now be found in the care plan section) Progress towards PT goals: Progressing toward goals    Frequency  Min 3X/week    PT Plan Current plan remains appropriate    Co-evaluation             End of Session Equipment Utilized During Treatment: Oxygen Activity Tolerance: Patient limited by fatigue;Treatment limited secondary to medical complications (Comment) (Increased HR and decreased O2 with activity) Patient left: in chair;with call bell/phone within reach  Time: 1050-1106 PT Time Calculation (min) (ACUTE ONLY): 16 min  Charges:  $Therapeutic Exercise: 8-22 mins                    G Codes:      Lelon Mast 07/26/2015, 11:15 AM

## 2015-07-26 NOTE — Discharge Instructions (Signed)
Hospice °Hospice is a service that is designed to provide people who are terminally ill and their families with medical, spiritual, and psychological support. Its aim is to improve your quality of life by keeping you as alert and comfortable as possible. Hospice is performed by a team of health care professionals and volunteers who: °· Help keep you comfortable. Hospice can be provided in your home or in a homelike setting. The hospice staff works with your family and friends to help meet your needs. You will enjoy the support of loved ones by receiving much of your basic care from family and friends. °· Provide pain relief and manage your symptoms. The staff supply all necessary medicines and equipment. °· Provide companionship when you are alone. °· Allow you and your family to rest. They may do light housekeeping, prepare meals, and run errands. °· Provide counseling. They will make sure your emotional, spiritual, and social needs and those of your family are being met. °· Provide spiritual care. Spiritual care is individualized to meet your needs and your family's needs. It may involve helping you look at what death means to you, say goodbye, or perform a specific religious ceremony or ritual. °Hospice teams often include: °· A nurse. °· A doctor. °· Social workers. °· Religious leaders (such as a chaplain). °· Trained volunteers. °WHEN SHOULD HOSPICE CARE BEGIN? °Most people who use hospice are believed to have fewer than 6 months to live. Your family and health care providers can help you decide when hospice services should begin. If your condition improves, you may discontinue the program. °WHAT SHOULD I CONSIDER BEFORE SELECTING A PROGRAM? °Most hospice programs are run by nonprofit, independent organizations. Some are affiliated with hospitals, nursing homes, or home health care agencies. Hospice programs can take place in the home or at a hospice center, hospital, or skilled nursing facility. When choosing  a hospice program, ask the following questions: °· What services are available to me? °· What services are offered to my loved ones? °· How involved are my loved ones? °· How involved is my health care provider? °· Who makes up the hospice care team? How are they trained or screened? °· How will my pain and symptoms be managed? °· If my circumstances change, can the services be provided in a different setting, such as my home or in the hospital? °· Is the program reviewed and licensed by the state or certified in some other way? °WHERE CAN I LEARN MORE ABOUT HOSPICE? °You can learn about existing hospice programs in your area from your health care providers. You can also read more about hospice online. The websites of the following organizations contain helpful information: °· The National Hospice and Palliative Care Organization (NHPCO). °· The Hospice Association of America (HAA). °· The Hospice Education Institute. °· The American Cancer Society (ACS). °· Hospice Net. °  °This information is not intended to replace advice given to you by your health care provider. Make sure you discuss any questions you have with your health care provider. °  °Document Released: 06/09/2003 Document Revised: 02/25/2013 Document Reviewed: 12/31/2012 °Elsevier Interactive Patient Education ©2016 Elsevier Inc. ° °

## 2015-07-26 NOTE — Care Management Note (Signed)
Case Management Note  Patient Details  Name: Jontavious Duman. MRN: [id number] Date of Birth: [birth year]  Subjective/Objective:     Pt admitted for Bilateral PE. Plan for d/c to Residential Facility.            Action/Plan: CSW assisting with disposition needs. No needs from CM at this time.   Expected Discharge Date:                  Expected Discharge Plan:  Home w Hospice Care  In-House Referral:  Clinical Social Work  Discharge planning Services  CM Consult  Post Acute Care Choice:  NA Choice offered to:  NA  DME Arranged:  N/A DME Agency:  NA  HH Arranged:  NA HH Agency:  NA  Status of Service:  Completed, signed off  Medicare Important Message Given:  Yes Date Medicare IM Given:    Medicare IM give by:    Date Additional Medicare IM Given:    Additional Medicare Important Message give by:     If discussed at [place] of Stay Meetings, dates discussed:    Additional Comments:  Bethena Roys, RN 07/26/2015, 2:22 PM

## 2015-07-28 ENCOUNTER — Other Ambulatory Visit: Payer: Medicare Other

## 2015-07-28 ENCOUNTER — Ambulatory Visit: Payer: Medicare Other | Admitting: Oncology

## 2015-08-03 ENCOUNTER — Encounter: Payer: Self-pay | Admitting: *Deleted

## 2015-08-03 ENCOUNTER — Ambulatory Visit: Payer: Medicare Other | Admitting: Cardiology

## 2015-08-16 ENCOUNTER — Telehealth: Payer: Self-pay | Admitting: Pharmacist

## 2015-08-16 NOTE — Telephone Encounter (Signed)
Voicemail left from [facility] staff member, Santiago Glad to [facility] re: pts Capecitabine rx. I called Santiago Glad back (ph# [phone]) and informed her pt is inpatient currently and not on Capecitabine presently. She discontinued his [facility] services and if needed in future, will need to resubmit Rx. Kennith Center, Pharm.D., CPP 08/16/2015@12 :[place]

## 2015-09-04 DEATH — deceased

## 2016-02-19 ENCOUNTER — Other Ambulatory Visit: Payer: Self-pay | Admitting: Nurse Practitioner

## 2016-05-18 IMAGING — CT CT ABD-PELV W/ CM
2 of 5 series · 16 of 46 positions shown, 18 images · IV contrast (READICAT/WATER & [ID] OMNI 300)
Comparison: None

CLINICAL DATA: New diagnosis of gastric carcinoma along lesser
curvature. Found on endoscopy [DATE]. Diabetes.

EXAM:
CT ABDOMEN AND PELVIS WITH CONTRAST
TECHNIQUE: Multidetector CT imaging of the abdomen and pelvis was performed
using the standard protocol following bolus administration of
intravenous contrast.
CONTRAST:  100mL OMNIPAQUE IOHEXOL 300 MG/ML  SOLN
BUN and creatinine were obtained on site at [HOSPITAL] at
[HOSPITAL].
Results:  BUN 24 mg/dL,  Creatinine 1.2 mg/dL.

[Series 2: abd/pelvis with · axial · 0.78mm/px · z∈[-427,-27]mm · 13 of 92 slices shown, 15 images]
[im 6/92  soft-tissue]
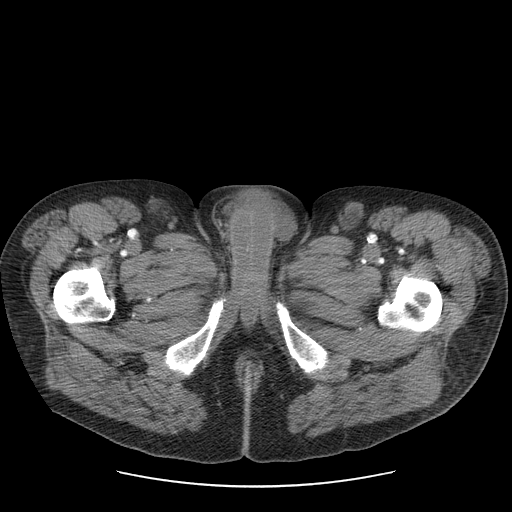
[im 6/92  bone]
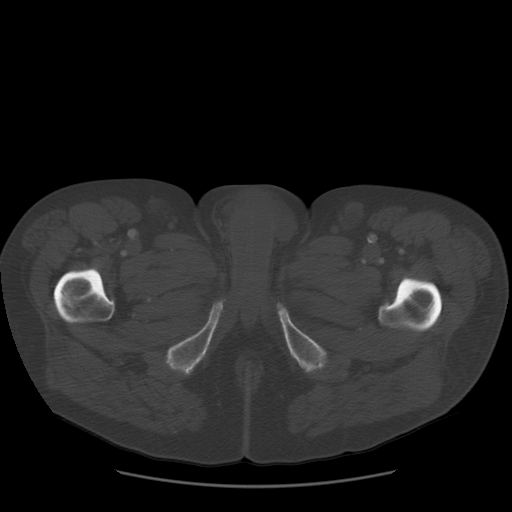
[im 12/92  soft-tissue]
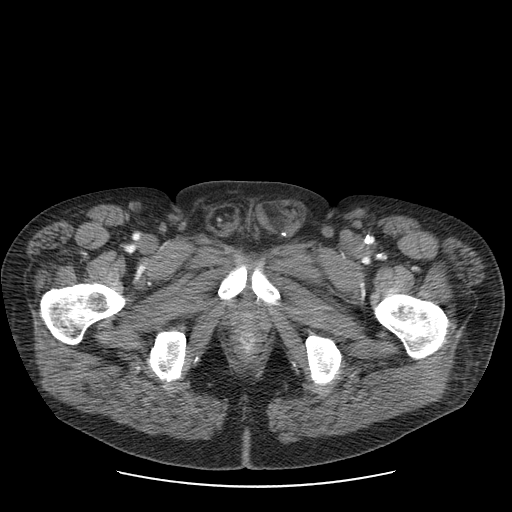
[im 18/92  soft-tissue]
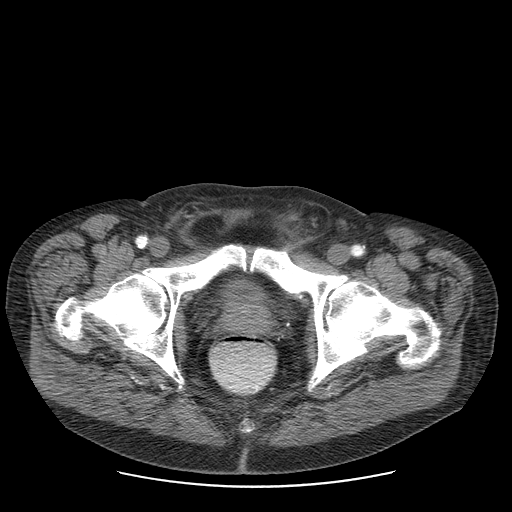
[im 29/92  soft-tissue]
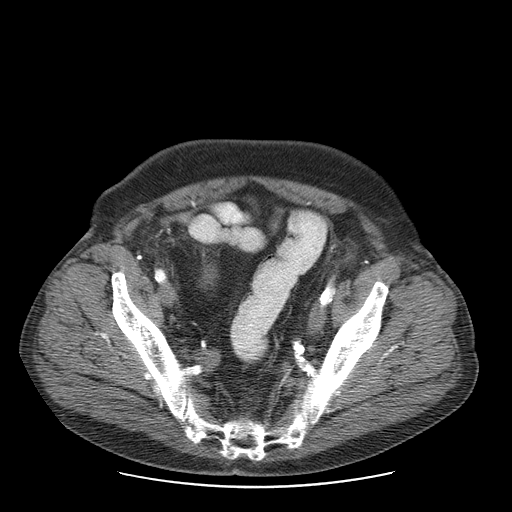
[im 35/92  soft-tissue]
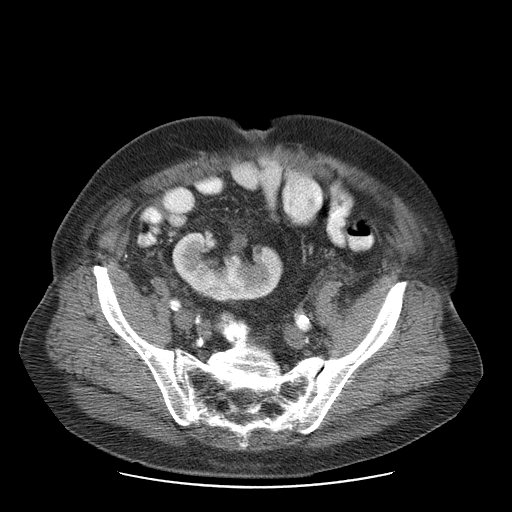
[im 40/92  soft-tissue]
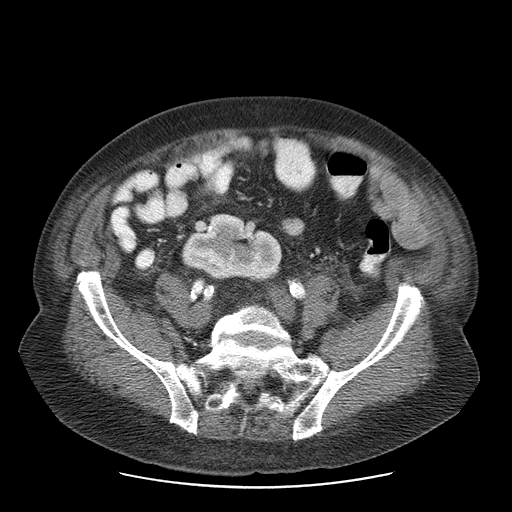
[im 46/92  soft-tissue]
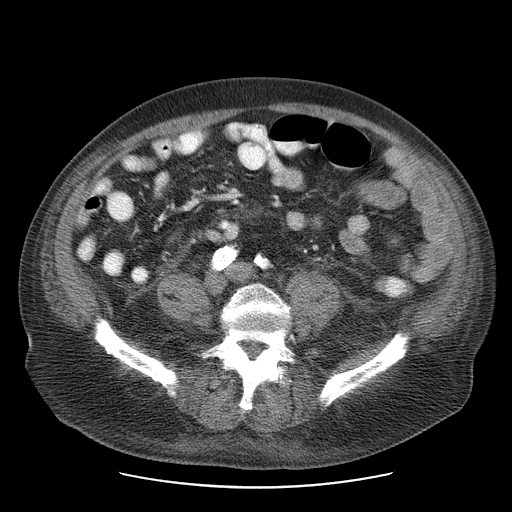
[im 52/92  soft-tissue]
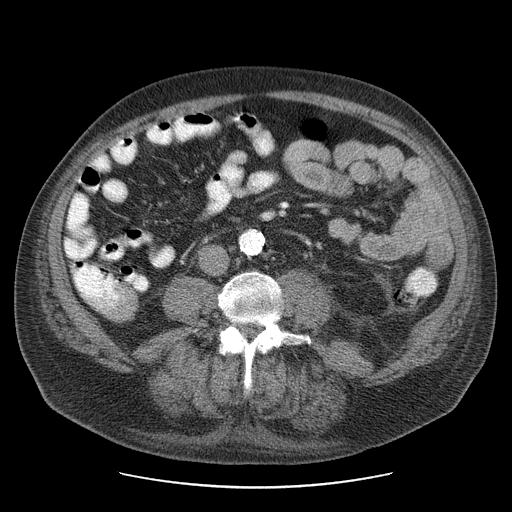
[im 57/92  soft-tissue]
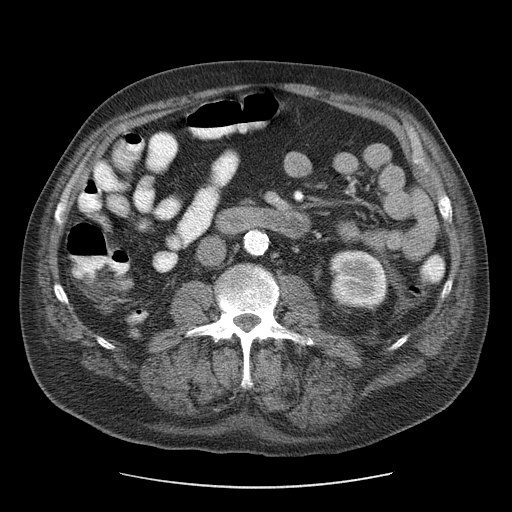
[im 57/92  bone]
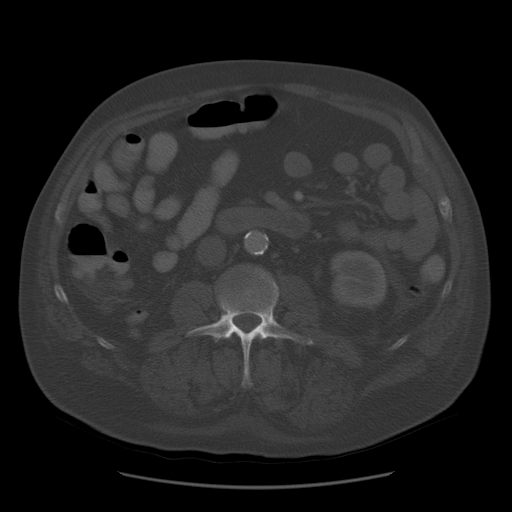
[im 63/92  soft-tissue]
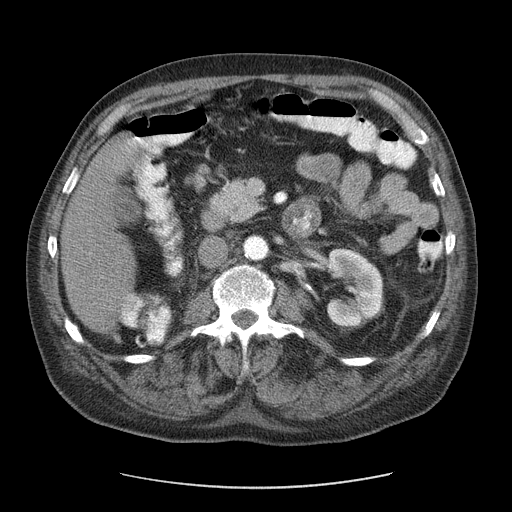
[im 74/92  soft-tissue]
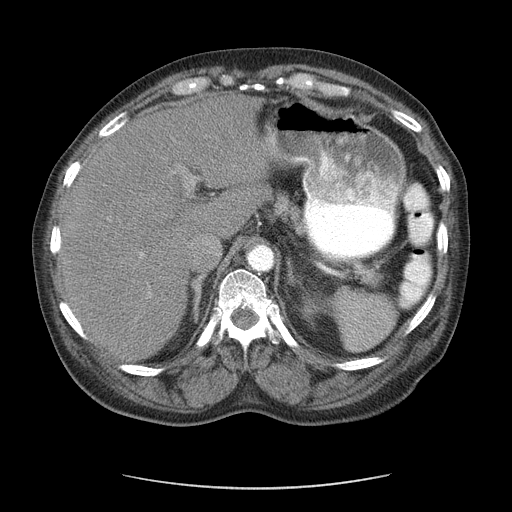
[im 80/92  soft-tissue]
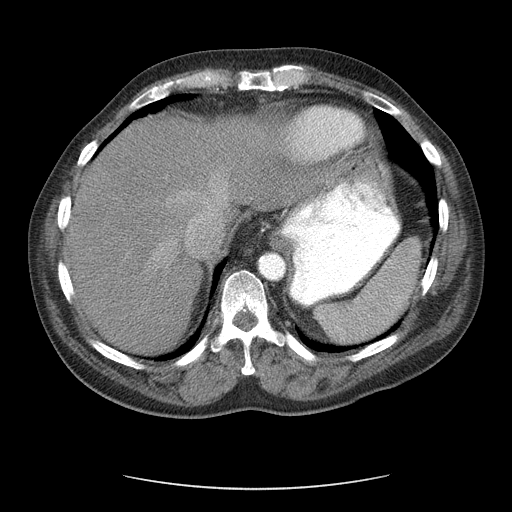
[im 86/92  soft-tissue]
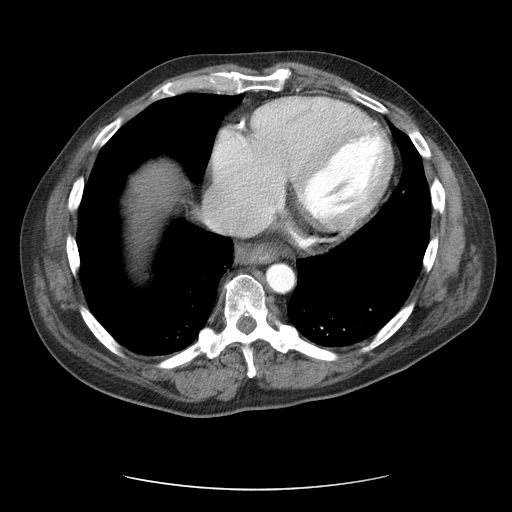

[Series 400: cor · coronal · 0.91mm/px · 3 of 159 slices shown]
[im 53/159  soft-tissue]
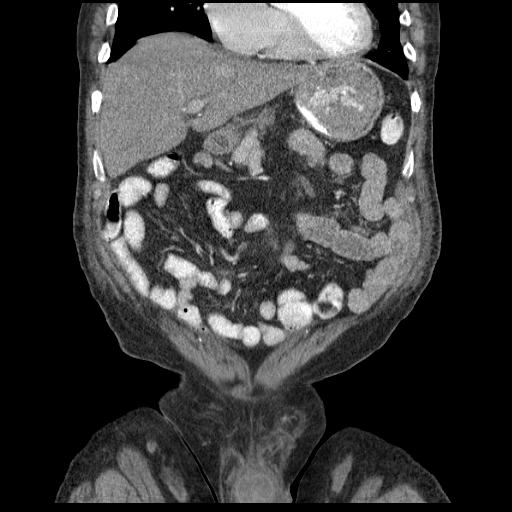
[im 71/159  soft-tissue]
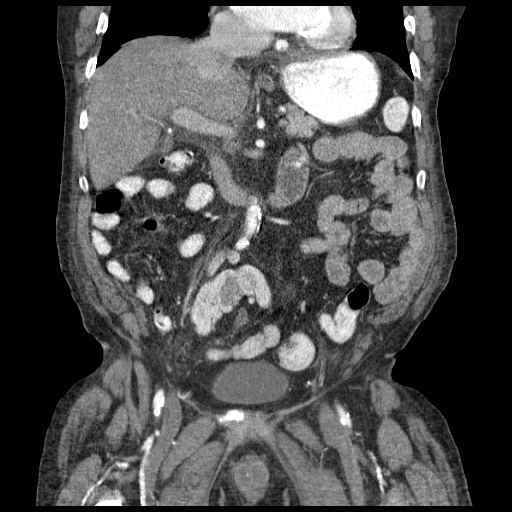
[im 88/159  soft-tissue]
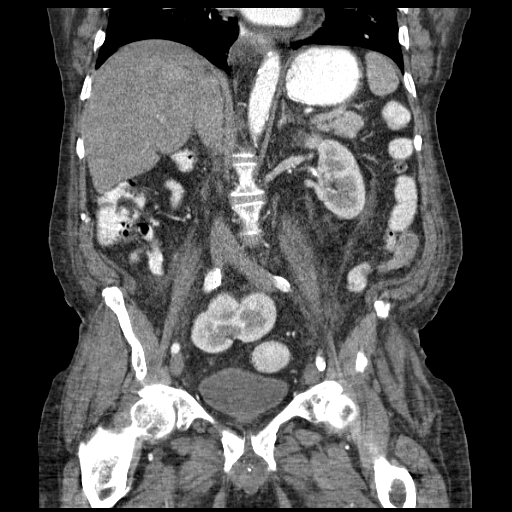

[16 of 46 positions shown; findings below may reference images not displayed]

FINDINGS: Lower Chest: Clear lung bases. Mild cardiomegaly with coronary
artery atherosclerosis. Trace posterior pericardial fluid, likely
physiologic. Small hiatal hernia.

Abdomen/Pelvis: Probable hepatic steatosis. Old granulomatous
disease in the right lobe of the liver. Dilated hepatic veins and
intrahepatic IVC , suggesting elevated right heart pressures.

Normal spleen. No dominant gastric lesion is seen within the fundus
or body. The antrum is underdistended. Perigastric adenopathy. 1.6 x
1.5 cm perigastric node on image 22 of series 2.

A peripancreatic node (possibly within the gastrocolic ligament)
measures 1.3 x 1.5 cm on image 29. Gastrohepatic ligament node of
1.8 x 1.4 cm on image 16.

Contracted gallbladder without pericholecystic inflammation. No
biliary ductal dilatation.

Normal adrenal glands. Right-sided pelvic kidney. Normal left
kidney.

Retroaortic left renal vein. Advanced aortic and branch vessel
atherosclerosis. No retroperitoneal adenopathy.

Scattered colonic diverticula. Normal terminal ileum and appendix.
Normal small bowel. Mild peritoneal thickening including along the
left pericolic gutter. No evidence of omental or peritoneal disease.

Bilateral fat containing inguinal hernias. Larger on the left. No
pelvic adenopathy. Normal urinary bladder and prostate.

Bones/Musculoskeletal: Osteopenia. Heterogeneous density throughout
the sacrum. Mild loss of vertebral body height at L4-L5.
IMPRESSION: 1. The gastric primary is not definitely visualized. The gastric
antrum is underdistended.
2. Perigastric adenopathy, highly suspicious for localize nodal
metastasis.
3. Findings likely related to elevated right heart pressures and a
component of fluid overload.
4. Right pelvic kidney.
5. Advanced atherosclerosis.
6. Heterogeneous density throughout the sacrum. Nonspecific.
Question Paget's disease. Correlate with any history of radiation
therapy, which could have a similar appearance.
7. Hepatic steatosis.
